# Patient Record
Sex: Female | Born: 1955
Health system: Southern US, Community
[De-identification: ages and names within clinical notes are randomized; demographics above are authoritative.]

## PROBLEM LIST (undated history)

## (undated) ENCOUNTER — Emergency Department: Admission: EM | Payer: Medicare HMO | Source: Home / Self Care

## (undated) DIAGNOSIS — K219 Gastro-esophageal reflux disease without esophagitis: Secondary | ICD-10-CM

## (undated) DIAGNOSIS — Z9889 Other specified postprocedural states: Secondary | ICD-10-CM

## (undated) DIAGNOSIS — M1990 Unspecified osteoarthritis, unspecified site: Secondary | ICD-10-CM

## (undated) DIAGNOSIS — E213 Hyperparathyroidism, unspecified: Secondary | ICD-10-CM

## (undated) DIAGNOSIS — Z87442 Personal history of urinary calculi: Secondary | ICD-10-CM

## (undated) DIAGNOSIS — J189 Pneumonia, unspecified organism: Secondary | ICD-10-CM

## (undated) DIAGNOSIS — R112 Nausea with vomiting, unspecified: Secondary | ICD-10-CM

## (undated) DIAGNOSIS — I10 Essential (primary) hypertension: Secondary | ICD-10-CM

## (undated) DIAGNOSIS — R7303 Prediabetes: Secondary | ICD-10-CM

## (undated) HISTORY — PX: FRACTURE SURGERY: SHX138

## (undated) HISTORY — PX: APPENDECTOMY: SHX54

## (undated) HISTORY — PX: COLONOSCOPY W/ POLYPECTOMY: SHX1380

## (undated) HISTORY — PX: BACK SURGERY: SHX140

---

## 2017-10-14 ENCOUNTER — Other Ambulatory Visit: Payer: Self-pay

## 2017-10-14 ENCOUNTER — Encounter: Payer: Self-pay | Admitting: Emergency Medicine

## 2017-10-14 ENCOUNTER — Emergency Department
Admission: EM | Admit: 2017-10-14 | Discharge: 2017-10-14 | Disposition: A | Discharge status: Home / Self Care | Payer: BLUE CROSS/BLUE SHIELD | Attending: Emergency Medicine | Admitting: Emergency Medicine

## 2017-10-14 DIAGNOSIS — F17200 Nicotine dependence, unspecified, uncomplicated: Secondary | ICD-10-CM | POA: Diagnosis not present

## 2017-10-14 DIAGNOSIS — M436 Torticollis: Secondary | ICD-10-CM | POA: Insufficient documentation

## 2017-10-14 DIAGNOSIS — M542 Cervicalgia: Secondary | ICD-10-CM | POA: Diagnosis present

## 2017-10-14 DIAGNOSIS — M25511 Pain in right shoulder: Secondary | ICD-10-CM | POA: Insufficient documentation

## 2017-10-14 DIAGNOSIS — I10 Essential (primary) hypertension: Secondary | ICD-10-CM | POA: Diagnosis not present

## 2017-10-14 HISTORY — DX: Essential (primary) hypertension: I10

## 2017-10-14 MED ORDER — OXYCODONE-ACETAMINOPHEN 5-325 MG PO TABS
1.0000 | ORAL_TABLET | ORAL | Status: DC | PRN
  Administered 2017-10-14: 1 via ORAL

## 2017-10-14 MED ORDER — DIAZEPAM 2 MG PO TABS
2.0000 mg | ORAL_TABLET | Freq: Once | ORAL | Status: AC
  Administered 2017-10-14: 2 mg via ORAL
  Filled 2017-10-14: qty 1

## 2017-10-14 MED ORDER — OXYCODONE-ACETAMINOPHEN 5-325 MG PO TABS: 1.0000 | ORAL_TABLET | ORAL | 0 refills | Status: DC | PRN

## 2017-10-14 MED ORDER — DIAZEPAM 2 MG PO TABS: 2.0000 mg | ORAL_TABLET | Freq: Three times a day (TID) | ORAL | 0 refills | Status: DC | PRN

## 2017-10-14 MED ORDER — OXYCODONE-ACETAMINOPHEN 5-325 MG PO TABS
ORAL_TABLET | ORAL | Status: AC
  Filled 2017-10-14: qty 1

## 2017-10-14 NOTE — Discharge Instructions (Signed)
1.  You may continue to take ibuprofen 3 times daily.  Add Percocet for pain and Valium for muscle relaxation. 2.

## 2017-10-14 NOTE — ED Triage Notes (Signed)
Pt reports she woke up from sleep with severe neck pain to left side that radiates to left shoulder reports she has been having a pinched nerve all week on and off. Pt talks in complete sentences no respiratory distress noted

## 2017-10-14 NOTE — ED Notes (Signed)
Pt reports took 800mg  Aleve around 11pm last night

## 2017-10-14 NOTE — ED Provider Notes (Signed)
[facility] Emergency Department Provider Note   ____________________________________________   First MD Initiated Contact with Patient 10/14/17 [phone]     (approximate)  I have reviewed the triage vital signs and the nursing notes.   HISTORY  Chief Complaint Neck Pain and Shoulder Pain    HPI Michele Sanders is a 62 y.o. female who presents to the ED from home with a chief complaint of nontraumatic neck pain.  Patient awoke at 11 PM with pain to the left side of her neck which radiates to her left shoulder.  States she has been having a pinched nerve all week on and off.  Last week it was affecting her right shoulder, tonight it is affecting her left, nondominant shoulder.  Denies extremity weakness, numbness or tingling.  Pain is exacerbated by turning her neck.  Denies recent fever, chills, chest pain, shortness of breath, abdominal pain, nausea or vomiting.  Denies recent trauma.   Past Medical History:  Diagnosis Date  . Hypertension     There are no active problems to display for this patient.   Past Surgical History:  Procedure Laterality Date  . APPENDECTOMY    . BACK SURGERY      Prior to Admission medications   Medication Sig Start Date End Date Taking? Authorizing Provider  diazepam (VALIUM) 2 MG tablet Take 1 tablet (2 mg total) by mouth every 8 (eight) hours as needed for muscle spasms. 10/14/17   Paulette Blanch, MD  oxyCODONE-acetaminophen (PERCOCET/ROXICET) 5-325 MG tablet Take 1 tablet by mouth every 4 (four) hours as needed for severe pain. 10/14/17   Paulette Blanch, MD    Allergies Patient has no allergy information on record.  No family history on file.  Social History Social History   Tobacco Use  . Smoking status: Current Every Day Smoker    Packs/day: 0.50  Substance Use Topics  . Alcohol use: Not Currently  . Drug use: Not on file    Review of Systems  Constitutional: No fever/chills Eyes: No visual changes. ENT: No  sore throat. Cardiovascular: Denies chest pain. Respiratory: Denies shortness of breath. Gastrointestinal: No abdominal pain.  No nausea, no vomiting.  No diarrhea.  No constipation. Genitourinary: Negative for dysuria. Musculoskeletal: Positive for neck pain.  Negative for back pain. Skin: Negative for rash. Neurological: Negative for headaches, focal weakness or numbness.   ____________________________________________   PHYSICAL EXAM:  VITAL SIGNS: ED Triage Vitals  Enc Vitals Group     BP 10/14/17 0155 (!) 164/88     Pulse Rate 10/14/17 0155 94     Resp 10/14/17 0155 20     Temp 10/14/17 0155 98.5 F (36.9 C)     Temp Source 10/14/17 0155 Oral     SpO2 10/14/17 0155 98 %     Weight 10/14/17 0156 165 lb (74.8 kg)     Height 10/14/17 0156 5\' 5"  (1.651 m)     Head Circumference --      Peak Flow --      Pain Score 10/14/17 0155 10     Pain Loc --      Pain Edu? --      Excl. in [place]? --     Constitutional: Alert and oriented. Well appearing and in mild acute distress. Eyes: Conjunctivae are normal. PERRL. EOMI. Head: Atraumatic. Nose: No congestion/rhinnorhea. Mouth/Throat: Mucous membranes are moist.  Oropharynx non-erythematous. Neck: No stridor.  No cervical spine tenderness to palpation.  Left trapezius muscle spasms.  Pain on turning head to the left.  No meningismus.  No carotid bruits. Cardiovascular: Normal rate, regular rhythm. Grossly normal heart sounds.  Good peripheral circulation. Respiratory: Normal respiratory effort.  No retractions. Lungs CTAB. Gastrointestinal: Soft and nontender. No distention. No abdominal bruits. No CVA tenderness. Musculoskeletal:  Limited range of movement to left shoulder secondary to neck pain. No lower extremity tenderness nor edema.  No joint effusions. Neurologic:  Normal speech and language. No gross focal neurologic deficits are appreciated. MAEx4.  5/5 motor strength and sensation all extremities.  No gait  instability. Skin:  Skin is warm, dry and intact. No rash noted. Psychiatric: Mood and affect are normal. Speech and behavior are normal.  ____________________________________________   LABS (all labs ordered are listed, but only abnormal results are displayed)  Labs Reviewed - No data to display ____________________________________________  EKG  None ____________________________________________  RADIOLOGY  ED MD interpretation: None  Official radiology report(s): No results found.  ____________________________________________   PROCEDURES  Procedure(s) performed: None  Procedures  Critical Care performed: No  ____________________________________________   INITIAL IMPRESSION / ASSESSMENT AND PLAN / ED COURSE  As part of my medical decision making, I reviewed the following data within the electronic MEDICAL RECORD NUMBER History obtained from family, Nursing notes reviewed and incorporated, Old chart reviewed and Notes from prior ED visits   62 year old female who presents with left neck pain.  Differential diagnosis includes but is not limited to cervical strain, torticollis, cervical radiculopathy, CAD, lung etiology, etc.  Patient clearly has musculoskeletal tenderness on examination with muscle spasms.  She took ibuprofen prior to arrival.  In addition, patient received Percocet in triage which has brought her pain from 12/10 to 8/10.  Will add Valium for muscle relaxation and refer to orthopedics for outpatient follow-up.  Strict return precautions given.  Patient and spouse verbalize understanding and agree with plan of care.      ____________________________________________   FINAL CLINICAL IMPRESSION(S) / ED DIAGNOSES  Final diagnoses:  Torticollis     ED Discharge Orders         Ordered    oxyCODONE-acetaminophen (PERCOCET/ROXICET) 5-325 MG tablet  Every 4 hours PRN     10/14/17 0315    diazepam (VALIUM) 2 MG tablet  Every 8 hours PRN     10/14/17  0315           Note:  This document was prepared using Dragon voice recognition software and may include unintentional dictation errors.    Paulette Blanch, MD 10/14/17 [phone]

## 2017-10-14 NOTE — ED Notes (Signed)
Provided pt with an ice bag

## 2017-10-17 ENCOUNTER — Other Ambulatory Visit: Payer: Self-pay | Admitting: Internal Medicine

## 2017-10-17 DIAGNOSIS — I10 Essential (primary) hypertension: Secondary | ICD-10-CM | POA: Insufficient documentation

## 2017-10-17 DIAGNOSIS — F17210 Nicotine dependence, cigarettes, uncomplicated: Secondary | ICD-10-CM | POA: Insufficient documentation

## 2017-10-17 DIAGNOSIS — Z1231 Encounter for screening mammogram for malignant neoplasm of breast: Secondary | ICD-10-CM

## 2017-10-17 DIAGNOSIS — M8589 Other specified disorders of bone density and structure, multiple sites: Secondary | ICD-10-CM | POA: Insufficient documentation

## 2017-11-14 ENCOUNTER — Encounter: Payer: Self-pay | Admitting: Podiatry

## 2017-11-14 ENCOUNTER — Ambulatory Visit: Payer: BLUE CROSS/BLUE SHIELD | Admitting: Podiatry

## 2017-11-14 VITALS — BP 141/75 | HR 89 | Resp 16

## 2017-11-14 DIAGNOSIS — L600 Ingrowing nail: Secondary | ICD-10-CM

## 2017-11-14 MED ORDER — NEOMYCIN-POLYMYXIN-HC 1 % OT SOLN: OTIC | 1 refills | Status: DC

## 2017-11-14 NOTE — Patient Instructions (Signed)

## 2017-11-14 NOTE — Progress Notes (Signed)
Subjective:  Patient ID: Michele Sanders, female    DOB: [birth year],  MRN: [id number] HPI Chief Complaint  Patient presents with  . Toe Pain    Hallux left - medial border, tender x years, intermittently, red and swollen today, soaking in epsom salt daily, had border removed before but only temp  . New Patient (Initial Visit)    62 y.o. female presents with the above complaint.   ROS: Denies fever chills nausea vomiting muscle aches pains calf pain back pain chest pain shortness of breath.  Past Medical History:  Diagnosis Date  . Hypertension    Past Surgical History:  Procedure Laterality Date  . APPENDECTOMY    . BACK SURGERY      Current Outpatient Medications:  .  aspirin 81 MG chewable tablet, Chew by mouth daily., Disp: , Rfl:  .  Calcium Carb-Cholecalciferol (CALCIUM + D3 PO), Take by mouth., Disp: , Rfl:  .  CHANTIX STARTING MONTH PAK 0.5 MG X 11 & 1 MG X 42 tablet, TAKE AS DIRECTED PER PACKAGE INSTRUCTIONS, Disp: , Rfl: 0 .  lisinopril (PRINIVIL,ZESTRIL) 10 MG tablet, Take 10 mg by mouth daily., Disp: , Rfl: 1 .  NEOMYCIN-POLYMYXIN-HYDROCORTISONE (CORTISPORIN) 1 % SOLN OTIC solution, Apply 1-2 drops to toe BID after soaking, Disp: 10 mL, Rfl: 1 .  simvastatin (ZOCOR) 40 MG tablet, Take by mouth., Disp: , Rfl:   No Known Allergies Review of Systems Objective:   Vitals:   11/14/17 1025  BP: (!) 141/75  Pulse: 89  Resp: 16    General: Well developed, nourished, in no acute distress, alert and oriented x3   Dermatological: Skin is warm, dry and supple bilateral. Nails x 10 are well maintained; remaining integument appears unremarkable at this time. There are no open sores, no preulcerative lesions, no rash or signs of infection present.  Onychocryptosis.  Hallux left does demonstrate a sharp incurvated nail margin secondary to onychocryptosis with erythema and edema and a small abscess distally.  There is currently no purulence no malodor.  Vascular: Dorsalis Pedis  artery and Posterior Tibial artery pedal pulses are 2/4 bilateral with immedate capillary fill time. Pedal hair growth present. No varicosities and no lower extremity edema present bilateral.   Neruologic: Grossly intact via light touch bilateral. Vibratory intact via tuning fork bilateral. Protective threshold with Semmes Wienstein monofilament intact to all pedal sites bilateral. Patellar and Achilles deep tendon reflexes 2+ bilateral. No Babinski or clonus noted bilateral.   Musculoskeletal: No gross boney pedal deformities bilateral. No pain, crepitus, or limitation noted with foot and ankle range of motion bilateral. Muscular strength 5/5 in all groups tested bilateral.  Gait: Unassisted, Nonantalgic.    Radiographs:  None taken  Assessment & Plan:   Assessment: Ingrown toenail tibial border hallux left  Plan: Discussed etiology pathology conservative versus surgical therapies.  Chemical matricectomy was performed after 3 cc of a 50-50 mixture of Marcaine plain lidocaine plain was infiltrated in a hallux block.  She tolerated the procedure well.  She is provided with both oral and written home-going instructions for the care and soaking of her toe.  She is also provided with a prescription for Cortisporin Otic to be applied twice daily after soaking.  I will follow-up with her in 1 month.     Husayn Reim T. [place], [place]

## 2017-11-29 ENCOUNTER — Other Ambulatory Visit: Payer: BLUE CROSS/BLUE SHIELD

## 2018-01-19 ENCOUNTER — Other Ambulatory Visit: Payer: Self-pay | Admitting: Physician Assistant

## 2018-01-19 DIAGNOSIS — Z1231 Encounter for screening mammogram for malignant neoplasm of breast: Secondary | ICD-10-CM

## 2018-02-13 ENCOUNTER — Emergency Department
Admission: EM | Admit: 2018-02-13 | Discharge: 2018-02-14 | Disposition: A | Discharge status: Home / Self Care | Payer: BLUE CROSS/BLUE SHIELD | Attending: Emergency Medicine | Admitting: Emergency Medicine

## 2018-02-13 ENCOUNTER — Other Ambulatory Visit: Payer: Self-pay

## 2018-02-13 ENCOUNTER — Emergency Department: Payer: BLUE CROSS/BLUE SHIELD

## 2018-02-13 ENCOUNTER — Encounter: Payer: Self-pay | Admitting: Emergency Medicine

## 2018-02-13 DIAGNOSIS — R0789 Other chest pain: Secondary | ICD-10-CM | POA: Diagnosis present

## 2018-02-13 DIAGNOSIS — Z7982 Long term (current) use of aspirin: Secondary | ICD-10-CM | POA: Diagnosis not present

## 2018-02-13 DIAGNOSIS — F17210 Nicotine dependence, cigarettes, uncomplicated: Secondary | ICD-10-CM | POA: Diagnosis not present

## 2018-02-13 DIAGNOSIS — I10 Essential (primary) hypertension: Secondary | ICD-10-CM | POA: Insufficient documentation

## 2018-02-13 DIAGNOSIS — K449 Diaphragmatic hernia without obstruction or gangrene: Secondary | ICD-10-CM | POA: Insufficient documentation

## 2018-02-13 DIAGNOSIS — R911 Solitary pulmonary nodule: Secondary | ICD-10-CM | POA: Diagnosis not present

## 2018-02-13 DIAGNOSIS — Z79899 Other long term (current) drug therapy: Secondary | ICD-10-CM | POA: Diagnosis not present

## 2018-02-13 LAB — CBC WITH DIFFERENTIAL/PLATELET
Abs Immature Granulocytes: 0.06 10*3/uL (ref 0.00–0.07)
BASOS PCT: 1 %
Basophils Absolute: 0.1 10*3/uL (ref 0.0–0.1)
EOS ABS: 0.2 10*3/uL (ref 0.0–0.5)
EOS PCT: 1 %
HEMATOCRIT: 45.2 % (ref 36.0–46.0)
Hemoglobin: 15.2 g/dL — ABNORMAL HIGH (ref 12.0–15.0)
IMMATURE GRANULOCYTES: 0 %
LYMPHS ABS: 4.3 10*3/uL — AB (ref 0.7–4.0)
Lymphocytes Relative: 30 %
MCH: 31.2 pg (ref 26.0–34.0)
MCHC: 33.6 g/dL (ref 30.0–36.0)
MCV: 92.8 fL (ref 80.0–100.0)
Monocytes Absolute: 0.9 10*3/uL (ref 0.1–1.0)
Monocytes Relative: 6 %
NEUTROS PCT: 62 %
NRBC: 0 % (ref 0.0–0.2)
Neutro Abs: 8.9 10*3/uL — ABNORMAL HIGH (ref 1.7–7.7)
Platelets: 310 10*3/uL (ref 150–400)
RBC: 4.87 MIL/uL (ref 3.87–5.11)
RDW: 12.2 % (ref 11.5–15.5)
WBC: 14.4 10*3/uL — AB (ref 4.0–10.5)

## 2018-02-13 LAB — COMPREHENSIVE METABOLIC PANEL
ALT: 45 U/L — AB (ref 0–44)
AST: 25 U/L (ref 15–41)
Albumin: 4.2 g/dL (ref 3.5–5.0)
Alkaline Phosphatase: 71 U/L (ref 38–126)
Anion gap: 11 (ref 5–15)
BILIRUBIN TOTAL: 0.6 mg/dL (ref 0.3–1.2)
BUN: 15 mg/dL (ref 8–23)
CALCIUM: 9.7 mg/dL (ref 8.9–10.3)
CO2: 23 mmol/L (ref 22–32)
Chloride: 103 mmol/L (ref 98–111)
Creatinine, Ser: 0.58 mg/dL (ref 0.44–1.00)
GFR calc Af Amer: >60 mL/min (ref >60)
Glucose, Bld: 108 mg/dL — ABNORMAL HIGH (ref 70–99)
POTASSIUM: 3.3 mmol/L — AB (ref 3.5–5.1)
Sodium: 137 mmol/L (ref 135–145)
TOTAL PROTEIN: 7.4 g/dL (ref 6.5–8.1)

## 2018-02-13 LAB — FIBRIN DERIVATIVES D-DIMER (ARMC ONLY): Fibrin derivatives D-dimer (ARMC): 451.27 ng/mL (FEU) (ref 0.00–499.00)

## 2018-02-13 LAB — TROPONIN I

## 2018-02-13 MED ORDER — ALUM & MAG HYDROXIDE-SIMETH 200-200-20 MG/5ML PO SUSP
30.0000 mL | Freq: Once | ORAL | Status: AC
  Administered 2018-02-13: 30 mL via ORAL
  Filled 2018-02-13: qty 30

## 2018-02-13 MED ORDER — LIDOCAINE VISCOUS HCL 2 % MT SOLN
15.0000 mL | Freq: Once | OROMUCOSAL | Status: AC
  Administered 2018-02-13: 15 mL via ORAL
  Filled 2018-02-13: qty 15

## 2018-02-13 NOTE — ED Triage Notes (Signed)
Pt to triage via w/c with no distress noted; st since noon having heaviness to upper chest with no accomp symptoms; st hx of same with PE many years ago

## 2018-02-13 NOTE — ED Notes (Signed)
Patient hooked to monitors. Will continue to monitor.

## 2018-02-13 NOTE — ED Notes (Signed)
EDP in with patient 

## 2018-02-13 NOTE — ED Notes (Signed)
GI cocktail given per orders.

## 2018-02-13 NOTE — ED Notes (Signed)
Pain started around 12pm in center of chest. Pain when palpated. Patient states as history of blood clot in late 90's.

## 2018-02-14 ENCOUNTER — Emergency Department: Payer: BLUE CROSS/BLUE SHIELD

## 2018-02-14 ENCOUNTER — Ambulatory Visit
Admission: RE | Admit: 2018-02-14 | Discharge: 2018-02-14 | Disposition: A | Discharge status: Home / Self Care | Payer: BLUE CROSS/BLUE SHIELD | Source: Ambulatory Visit | Attending: Physician Assistant | Admitting: Physician Assistant

## 2018-02-14 DIAGNOSIS — Z1231 Encounter for screening mammogram for malignant neoplasm of breast: Secondary | ICD-10-CM

## 2018-02-14 LAB — TROPONIN I

## 2018-02-14 MED ORDER — PANTOPRAZOLE SODIUM 40 MG PO TBEC: 40.0000 mg | DELAYED_RELEASE_TABLET | Freq: Every day | ORAL | 1 refills | Status: DC

## 2018-02-14 MED ORDER — IOHEXOL 350 MG/ML SOLN
75.0000 mL | Freq: Once | INTRAVENOUS | Status: AC | PRN
  Administered 2018-02-14: 75 mL via INTRAVENOUS

## 2018-02-14 MED ORDER — PANTOPRAZOLE SODIUM 40 MG PO TBEC
40.0000 mg | DELAYED_RELEASE_TABLET | Freq: Once | ORAL | Status: AC
  Administered 2018-02-14: 40 mg via ORAL
  Filled 2018-02-14: qty 1

## 2018-02-14 NOTE — ED Notes (Signed)
Patient returned to room from CT. 

## 2018-02-14 NOTE — ED Provider Notes (Signed)
[facility] Emergency Department Provider Note ________________________   First MD Initiated Contact with Patient 02/14/18 0001     (approximate)  I have reviewed the triage vital signs and the nursing notes.   HISTORY  Chief Complaint Chest Pain    HPI Gerturde Sanders is a 62 y.o. female with history of hypertension and previous pulmonary emboli many years ago presents to the emergency department with central chest discomfort which patient stated started this morning.  Patient states that she takes but potassium and calcium pills daily and feels as though a pill was stuck sideways in the area of discomfort.  Patient denies any dyspnea no lower extremity pain or swelling.  Patient states that symptoms are also consistent with when she had a pulmonary emboli.  Patient denies any cardiac disease.  Patient denies any familial history of CAD.  Past Medical History:  Diagnosis Date  . Hypertension     Patient Active Problem List   Diagnosis Date Noted  . Cigarette smoker one half pack a day or less 10/17/2017  . Essential hypertension 10/17/2017  . Osteopenia of multiple sites 10/17/2017    Past Surgical History:  Procedure Laterality Date  . APPENDECTOMY    . BACK SURGERY      Prior to Admission medications   Medication Sig Start Date End Date Taking? Authorizing Provider  aspirin 81 MG chewable tablet Chew by mouth daily.    [provider]  Calcium Carb-Cholecalciferol (CALCIUM + D3 PO) Take by mouth.    [provider]  CHANTIX STARTING MONTH PAK 0.5 MG X 11 & 1 MG X 42 tablet TAKE AS DIRECTED PER PACKAGE INSTRUCTIONS 10/17/17   [provider]  lisinopril (PRINIVIL,ZESTRIL) 10 MG tablet Take 10 mg by mouth daily. 10/17/17   [provider]  NEOMYCIN-POLYMYXIN-HYDROCORTISONE (CORTISPORIN) 1 % SOLN OTIC solution Apply 1-2 drops to toe BID after soaking 11/14/17   Hyatt, Max T, DPM  pantoprazole (PROTONIX) 40 MG tablet  Take 1 tablet (40 mg total) by mouth daily. 02/14/18 02/14/19  Gregor Hams, MD  simvastatin (ZOCOR) 40 MG tablet Take by mouth.    [provider]    Allergies No known drug allergies No family history on file.  Social History Social History   Tobacco Use  . Smoking status: Current Every Day Smoker    Packs/day: 0.50  . Smokeless tobacco: Never Used  Substance Use Topics  . Alcohol use: Yes  . Drug use: Not on file    Review of Systems Constitutional: No fever/chills Eyes: No visual changes. ENT: No sore throat. Cardiovascular: Positive for chest pain. Respiratory: Denies shortness of breath. Gastrointestinal: No abdominal pain.  No nausea, no vomiting.  No diarrhea.  No constipation. Genitourinary: Negative for dysuria. Musculoskeletal: Negative for neck pain.  Negative for back pain. Integumentary: Negative for rash. Neurological: Negative for headaches, focal weakness or numbness.   ____________________________________________   PHYSICAL EXAM:  VITAL SIGNS: ED Triage Vitals  Enc Vitals Group     BP 02/13/18 1954 (!) 147/77     Pulse Rate 02/13/18 1954 87     Resp 02/13/18 1954 18     Temp 02/13/18 1954 97.8 F (36.6 C)     Temp Source 02/13/18 1954 Oral     SpO2 02/13/18 1954 97 %     Weight 02/13/18 1953 74.4 kg (164 lb)     Height 02/13/18 1953 1.651 m (5\' 5" )     Head Circumference --  Peak Flow --      Pain Score 02/13/18 1953 10     Pain Loc --      Pain Edu? --      Excl. in [place]? --     Constitutional: Alert and oriented. Well appearing and in no acute distress. Eyes: Conjunctivae are normal.  Mouth/Throat: Mucous membranes are moist. Oropharynx non-erythematous. Neck: No stridor.   Cardiovascular: Normal rate, regular rhythm. Good peripheral circulation. Grossly normal heart sounds. Respiratory: Normal respiratory effort.  No retractions. Lungs CTAB. Gastrointestinal: Soft and nontender. No distention.  Musculoskeletal: No  lower extremity tenderness nor edema. No gross deformities of extremities. Neurologic:  Normal speech and language. No gross focal neurologic deficits are appreciated.  Skin:  Skin is warm, dry and intact. No rash noted. Psychiatric: Mood and affect are normal. Speech and behavior are normal.  ____________________________________________   LABS (all labs ordered are listed, but only abnormal results are displayed)  Labs Reviewed  CBC WITH DIFFERENTIAL/PLATELET - Abnormal; Notable for the following components:      Result Value   WBC 14.4 (*)    Hemoglobin 15.2 (*)    Neutro Abs 8.9 (*)    Lymphs Abs 4.3 (*)    All other components within normal limits  COMPREHENSIVE METABOLIC PANEL - Abnormal; Notable for the following components:   Potassium 3.3 (*)    Glucose, Bld 108 (*)    ALT 45 (*)    All other components within normal limits  TROPONIN I  FIBRIN DERIVATIVES D-DIMER ([facility] ONLY)  TROPONIN I   ____________________________________________  EKG  ED ECG REPORT I, Geneva N Elnoria Livingston, the attending physician, personally viewed and interpreted this ECG.   Date: 02/13/2018  EKG Time: 8:01 PM  Rate: 88  Rhythm: Normal sinus rhythm  Axis: Normal  Intervals: Normal  ST&T Change: None  ____________________________________________  RADIOLOGY I, Askov N Milarose Savich, personally viewed and evaluated these images (plain radiographs) as part of my medical decision making, as well as reviewing the written report by the radiologist.  ED MD interpretation: No active cardiopulmonary disease on chest x-ray. CT chest revealed no pulmonary emboli.  Positive pulmonary nodules bilaterally.  And a hiatal hernia.  Official radiology report(s): Dg Chest 2 View  Result Date: 02/13/2018 CLINICAL DATA:  Chest pain EXAM: CHEST - 2 VIEW COMPARISON:  None. FINDINGS: The heart size and mediastinal contours are within normal limits. Both lungs are clear. Left humeral fracture fixation. IMPRESSION: No  active cardiopulmonary disease. Electronically Signed   By: Franchot Gallo M.D.   On: 02/13/2018 20:15   Ct Angio Chest Pe W And/or Wo Contrast  Result Date: 02/14/2018 CLINICAL DATA:  Upper chest heaviness. History of pulmonary emboli. Smoker. EXAM: CT ANGIOGRAPHY CHEST WITH CONTRAST TECHNIQUE: Multidetector CT imaging of the chest was performed using the standard protocol during bolus administration of intravenous contrast. Multiplanar CT image reconstructions and MIPs were obtained to evaluate the vascular anatomy. CONTRAST:  75mL OMNIPAQUE IOHEXOL 350 MG/ML SOLN COMPARISON:  Chest radiographs obtained earlier this evening. FINDINGS: Cardiovascular: Normally opacified pulmonary arteries with no pulmonary arterial filling defects seen. Mild atheromatous changes involving the aorta. Normal sized heart. Mediastinum/Nodes: Small to moderate-sized hiatal hernia. No enlarged lymph nodes. Unremarkable thyroid gland. Lungs/Pleura: Mild bilateral bullous changes and prominence of the interstitial markings with mild peribronchial thickening. Multiple small sub solid appearing nodules in the left upper lobe, the largest measuring 5 mm in maximum diameter on image number 28 series 6. There's a similar-sized slightly more solid-appearing  nodule in the superior segment of the right lower lobe, measuring 5 mm on image number 32 series 6. No pleural fluid. Upper Abdomen: Unremarkable. Musculoskeletal: Mild thoracic spine degenerative changes. Review of the MIP images confirms the above findings. IMPRESSION: 1. No pulmonary emboli. 2. Mild changes of COPD and chronic bronchitis. 3. Multiple small bilateral lung nodules, as described above. Non-contrast chest CT at 3-6 months is recommended. If nodules persist and are stable at that time, consider additional non-contrast chest CT examinations at 2 and 4 years. This recommendation follows the consensus statement: Guidelines for Management of Incidental Pulmonary Nodules  Detected on CT Images: From the Fleischner Society 2017; Radiology 2017; 284:228-243. 4. Small to moderate-sized hiatal hernia. 5. Mild calcified and noncalcified aortic atherosclerosis. Aortic Atherosclerosis (ICD10-I70.0) and Emphysema (ICD10-J43.9). Electronically Signed   By: Claudie Revering M.D.   On: 02/14/2018 01:23      Procedures   ____________________________________________   INITIAL IMPRESSION / ASSESSMENT AND PLAN / ED COURSE  As part of my medical decision making, I reviewed the following data within the electronic MEDICAL RECORD NUMBER  62 year old female presented with above-stated history and physical exam secondary to central chest discomfort that is nonradiating in nature.  Considered possibly of CAD and a such EKG was performed which revealed no evidence of ischemia or infarction.  Troponin negative x2.  Also consider the possibility of a pulmonary emboli however CT scan revealed no by.  Patient was given a GI cocktail as I suspect this to be secondary to esophagitis the patient states that she has had considerable reflux in the past month.  Patient's pain is all briefly after GI cocktail.  Patient given Protonix 40 mg tablet with recommendation to follow-up with primary care provider.    ____________________________________________  FINAL CLINICAL IMPRESSION(S) / ED DIAGNOSES  Final diagnoses:  Hiatal hernia  Pulmonary nodule     MEDICATIONS GIVEN DURING THIS VISIT:  Medications  alum & mag hydroxide-simeth (MAALOX/MYLANTA) 200-200-20 MG/5ML suspension 30 mL (30 mLs Oral Given 02/13/18 2357)    And  lidocaine (XYLOCAINE) 2 % viscous mouth solution 15 mL (15 mLs Oral Given 02/13/18 2357)  iohexol (OMNIPAQUE) 350 MG/ML injection 75 mL (75 mLs Intravenous Contrast Given 02/14/18 0048)  pantoprazole (PROTONIX) EC tablet 40 mg (40 mg Oral Given 02/14/18 0210)     ED Discharge Orders         Ordered    pantoprazole (PROTONIX) 40 MG tablet  Daily     02/14/18 0232             Note:  This document was prepared using Dragon voice recognition software and may include unintentional dictation errors.    Gregor Hams, MD 02/14/18 [phone]

## 2018-02-14 NOTE — ED Notes (Signed)
IV placed in left forearm. Patient tolerated with no issues.

## 2018-02-14 NOTE — ED Notes (Signed)
Patient transferred to CT

## 2018-07-24 ENCOUNTER — Other Ambulatory Visit: Payer: Self-pay | Admitting: Physician Assistant

## 2018-07-24 DIAGNOSIS — K449 Diaphragmatic hernia without obstruction or gangrene: Secondary | ICD-10-CM

## 2018-07-24 DIAGNOSIS — E782 Mixed hyperlipidemia: Secondary | ICD-10-CM | POA: Diagnosis not present

## 2018-07-24 DIAGNOSIS — R1310 Dysphagia, unspecified: Secondary | ICD-10-CM | POA: Diagnosis not present

## 2018-07-24 DIAGNOSIS — R918 Other nonspecific abnormal finding of lung field: Secondary | ICD-10-CM

## 2018-07-24 DIAGNOSIS — K210 Gastro-esophageal reflux disease with esophagitis: Secondary | ICD-10-CM | POA: Diagnosis not present

## 2018-07-24 DIAGNOSIS — I10 Essential (primary) hypertension: Secondary | ICD-10-CM | POA: Diagnosis not present

## 2018-08-03 ENCOUNTER — Ambulatory Visit: Payer: Medicare HMO

## 2018-10-16 DIAGNOSIS — E782 Mixed hyperlipidemia: Secondary | ICD-10-CM | POA: Diagnosis not present

## 2018-10-16 DIAGNOSIS — I10 Essential (primary) hypertension: Secondary | ICD-10-CM | POA: Diagnosis not present

## 2018-10-16 DIAGNOSIS — R7303 Prediabetes: Secondary | ICD-10-CM | POA: Diagnosis not present

## 2018-10-23 DIAGNOSIS — E782 Mixed hyperlipidemia: Secondary | ICD-10-CM | POA: Diagnosis not present

## 2018-10-23 DIAGNOSIS — R7303 Prediabetes: Secondary | ICD-10-CM | POA: Diagnosis not present

## 2018-10-23 DIAGNOSIS — F17210 Nicotine dependence, cigarettes, uncomplicated: Secondary | ICD-10-CM | POA: Diagnosis not present

## 2018-10-23 DIAGNOSIS — I10 Essential (primary) hypertension: Secondary | ICD-10-CM | POA: Diagnosis not present

## 2018-10-23 DIAGNOSIS — Z0000 Encounter for general adult medical examination without abnormal findings: Secondary | ICD-10-CM | POA: Diagnosis not present

## 2018-12-01 ENCOUNTER — Other Ambulatory Visit: Payer: Self-pay

## 2018-12-01 DIAGNOSIS — Z20822 Contact with and (suspected) exposure to covid-19: Secondary | ICD-10-CM

## 2018-12-02 LAB — NOVEL CORONAVIRUS, NAA: SARS-CoV-2, NAA: NOT DETECTED

## 2018-12-04 ENCOUNTER — Telehealth: Payer: Self-pay | Admitting: Internal Medicine

## 2018-12-04 NOTE — Telephone Encounter (Signed)
Negative COVID results given. Patient results "NOT Detected." Caller expressed understanding. ° °

## 2019-03-18 DIAGNOSIS — I10 Essential (primary) hypertension: Secondary | ICD-10-CM | POA: Diagnosis not present

## 2019-03-18 DIAGNOSIS — R7303 Prediabetes: Secondary | ICD-10-CM | POA: Diagnosis not present

## 2019-03-18 DIAGNOSIS — E782 Mixed hyperlipidemia: Secondary | ICD-10-CM | POA: Diagnosis not present

## 2019-03-22 ENCOUNTER — Other Ambulatory Visit: Payer: Self-pay | Admitting: Physician Assistant

## 2019-03-22 DIAGNOSIS — Z1231 Encounter for screening mammogram for malignant neoplasm of breast: Secondary | ICD-10-CM

## 2019-03-25 DIAGNOSIS — E782 Mixed hyperlipidemia: Secondary | ICD-10-CM | POA: Diagnosis not present

## 2019-03-25 DIAGNOSIS — Z0000 Encounter for general adult medical examination without abnormal findings: Secondary | ICD-10-CM | POA: Diagnosis not present

## 2019-03-25 DIAGNOSIS — D72829 Elevated white blood cell count, unspecified: Secondary | ICD-10-CM | POA: Diagnosis not present

## 2019-03-25 DIAGNOSIS — K219 Gastro-esophageal reflux disease without esophagitis: Secondary | ICD-10-CM | POA: Diagnosis not present

## 2019-03-25 DIAGNOSIS — F17210 Nicotine dependence, cigarettes, uncomplicated: Secondary | ICD-10-CM | POA: Diagnosis not present

## 2019-03-25 DIAGNOSIS — F439 Reaction to severe stress, unspecified: Secondary | ICD-10-CM | POA: Diagnosis not present

## 2019-03-25 DIAGNOSIS — I10 Essential (primary) hypertension: Secondary | ICD-10-CM | POA: Diagnosis not present

## 2019-03-26 ENCOUNTER — Ambulatory Visit
Admission: RE | Admit: 2019-03-26 | Discharge: 2019-03-26 | Disposition: A | Discharge status: Home / Self Care | Payer: Medicare HMO | Source: Ambulatory Visit | Attending: Physician Assistant | Admitting: Physician Assistant

## 2019-03-26 DIAGNOSIS — Z1231 Encounter for screening mammogram for malignant neoplasm of breast: Secondary | ICD-10-CM | POA: Diagnosis not present

## 2019-03-28 ENCOUNTER — Ambulatory Visit: Payer: Medicare HMO | Attending: Internal Medicine

## 2019-03-28 DIAGNOSIS — Z23 Encounter for immunization: Secondary | ICD-10-CM

## 2019-03-28 NOTE — Progress Notes (Signed)
Covid-19 Vaccination Clinic  Name:  Victory Alves    MRN: [id number] DOB: 27-Dec-1955  03/28/2019  Ms. Stribling was observed post Covid-19 immunization for 15 minutes without incidence. She was provided with Vaccine Information Sheet and instruction to access the V-Safe system.   Ms. Batdorf was instructed to call 911 with any severe reactions post vaccine: Marland Kitchen Difficulty breathing  . Swelling of your face and throat  . A fast heartbeat  . A bad rash all over your body  . Dizziness and weakness    Immunizations Administered    Name Date Dose VIS Date Route   Pfizer COVID-19 Vaccine 03/28/2019  3:01 PM 0.3 mL 02/15/2019 Intramuscular   Manufacturer: [employer], [employer]   Lot: [id number]   NDC: [address]-4

## 2019-04-01 ENCOUNTER — Other Ambulatory Visit: Payer: Self-pay | Admitting: Physician Assistant

## 2019-04-01 DIAGNOSIS — R928 Other abnormal and inconclusive findings on diagnostic imaging of breast: Secondary | ICD-10-CM

## 2019-04-01 DIAGNOSIS — N6320 Unspecified lump in the left breast, unspecified quadrant: Secondary | ICD-10-CM

## 2019-04-03 ENCOUNTER — Ambulatory Visit
Admission: RE | Admit: 2019-04-03 | Discharge: 2019-04-03 | Disposition: A | Discharge status: Home / Self Care | Payer: Medicare HMO | Source: Ambulatory Visit | Attending: Physician Assistant | Admitting: Physician Assistant

## 2019-04-03 DIAGNOSIS — N6320 Unspecified lump in the left breast, unspecified quadrant: Secondary | ICD-10-CM | POA: Insufficient documentation

## 2019-04-03 DIAGNOSIS — N6323 Unspecified lump in the left breast, lower outer quadrant: Secondary | ICD-10-CM | POA: Diagnosis not present

## 2019-04-03 DIAGNOSIS — R928 Other abnormal and inconclusive findings on diagnostic imaging of breast: Secondary | ICD-10-CM

## 2019-04-03 DIAGNOSIS — R922 Inconclusive mammogram: Secondary | ICD-10-CM | POA: Diagnosis not present

## 2019-04-05 ENCOUNTER — Other Ambulatory Visit: Payer: Self-pay | Admitting: Physician Assistant

## 2019-04-05 DIAGNOSIS — N6002 Solitary cyst of left breast: Secondary | ICD-10-CM

## 2019-04-05 DIAGNOSIS — R928 Other abnormal and inconclusive findings on diagnostic imaging of breast: Secondary | ICD-10-CM

## 2019-04-08 ENCOUNTER — Ambulatory Visit
Admission: RE | Admit: 2019-04-08 | Discharge: 2019-04-08 | Disposition: A | Discharge status: Home / Self Care | Payer: Medicare HMO | Source: Ambulatory Visit | Attending: Physician Assistant | Admitting: Physician Assistant

## 2019-04-08 DIAGNOSIS — N6002 Solitary cyst of left breast: Secondary | ICD-10-CM

## 2019-04-08 DIAGNOSIS — I899 Noninfective disorder of lymphatic vessels and lymph nodes, unspecified: Secondary | ICD-10-CM | POA: Diagnosis not present

## 2019-04-08 DIAGNOSIS — R590 Localized enlarged lymph nodes: Secondary | ICD-10-CM | POA: Diagnosis not present

## 2019-04-08 DIAGNOSIS — R928 Other abnormal and inconclusive findings on diagnostic imaging of breast: Secondary | ICD-10-CM

## 2019-04-08 DIAGNOSIS — R0789 Other chest pain: Secondary | ICD-10-CM | POA: Diagnosis not present

## 2019-04-08 DIAGNOSIS — N6321 Unspecified lump in the left breast, upper outer quadrant: Secondary | ICD-10-CM | POA: Insufficient documentation

## 2019-04-08 HISTORY — PX: BREAST CYST ASPIRATION: SHX578

## 2019-04-09 HISTORY — PX: BREAST BIOPSY: SHX20

## 2019-04-10 LAB — SURGICAL PATHOLOGY

## 2019-04-18 ENCOUNTER — Ambulatory Visit: Payer: Medicare HMO | Attending: Internal Medicine

## 2019-04-18 DIAGNOSIS — Z23 Encounter for immunization: Secondary | ICD-10-CM | POA: Insufficient documentation

## 2019-04-18 NOTE — Progress Notes (Signed)
Covid-19 Vaccination Clinic  Name:  Michele Sanders    MRN: [id number] DOB: 27-May-1955  04/18/2019  Michele Sanders was observed post Covid-19 immunization for 15 minutes without incidence. She was provided with Vaccine Information Sheet and instruction to access the V-Safe system.   Michele Sanders was instructed to call 911 with any severe reactions post vaccine: Marland Kitchen Difficulty breathing  . Swelling of your face and throat  . A fast heartbeat  . A bad rash all over your body  . Dizziness and weakness    Immunizations Administered    Name Date Dose VIS Date Route   Pfizer COVID-19 Vaccine 04/18/2019  2:50 PM 0.3 mL 02/15/2019 Intramuscular   Manufacturer: [employer], [employer]   Lot: [id number]   NDC: [id number]

## 2019-06-05 DIAGNOSIS — Z20822 Contact with and (suspected) exposure to covid-19: Secondary | ICD-10-CM | POA: Diagnosis not present

## 2019-06-25 DIAGNOSIS — M25562 Pain in left knee: Secondary | ICD-10-CM | POA: Diagnosis not present

## 2019-06-25 DIAGNOSIS — Z87891 Personal history of nicotine dependence: Secondary | ICD-10-CM | POA: Diagnosis not present

## 2019-06-25 DIAGNOSIS — M25462 Effusion, left knee: Secondary | ICD-10-CM | POA: Diagnosis not present

## 2019-06-25 DIAGNOSIS — M1712 Unilateral primary osteoarthritis, left knee: Secondary | ICD-10-CM | POA: Diagnosis not present

## 2019-08-07 DIAGNOSIS — H524 Presbyopia: Secondary | ICD-10-CM | POA: Diagnosis not present

## 2019-09-25 DIAGNOSIS — D72829 Elevated white blood cell count, unspecified: Secondary | ICD-10-CM | POA: Diagnosis not present

## 2019-09-25 DIAGNOSIS — R7303 Prediabetes: Secondary | ICD-10-CM | POA: Diagnosis not present

## 2019-09-25 DIAGNOSIS — R7989 Other specified abnormal findings of blood chemistry: Secondary | ICD-10-CM | POA: Diagnosis not present

## 2019-09-25 DIAGNOSIS — I10 Essential (primary) hypertension: Secondary | ICD-10-CM | POA: Diagnosis not present

## 2019-09-25 DIAGNOSIS — E782 Mixed hyperlipidemia: Secondary | ICD-10-CM | POA: Diagnosis not present

## 2019-10-02 DIAGNOSIS — F17210 Nicotine dependence, cigarettes, uncomplicated: Secondary | ICD-10-CM | POA: Diagnosis not present

## 2019-10-02 DIAGNOSIS — R7303 Prediabetes: Secondary | ICD-10-CM | POA: Diagnosis not present

## 2019-10-02 DIAGNOSIS — R7989 Other specified abnormal findings of blood chemistry: Secondary | ICD-10-CM | POA: Diagnosis not present

## 2019-10-02 DIAGNOSIS — I10 Essential (primary) hypertension: Secondary | ICD-10-CM | POA: Diagnosis not present

## 2019-10-02 DIAGNOSIS — E782 Mixed hyperlipidemia: Secondary | ICD-10-CM | POA: Diagnosis not present

## 2019-10-14 DIAGNOSIS — H0012 Chalazion right lower eyelid: Secondary | ICD-10-CM | POA: Diagnosis not present

## 2019-11-26 DIAGNOSIS — Z20822 Contact with and (suspected) exposure to covid-19: Secondary | ICD-10-CM | POA: Diagnosis not present

## 2019-12-23 DIAGNOSIS — Z0110 Encounter for examination of ears and hearing without abnormal findings: Secondary | ICD-10-CM | POA: Diagnosis not present

## 2020-03-13 ENCOUNTER — Other Ambulatory Visit: Payer: Self-pay | Admitting: Physician Assistant

## 2020-03-13 DIAGNOSIS — N6320 Unspecified lump in the left breast, unspecified quadrant: Secondary | ICD-10-CM

## 2020-03-19 DIAGNOSIS — E782 Mixed hyperlipidemia: Secondary | ICD-10-CM | POA: Diagnosis not present

## 2020-03-19 DIAGNOSIS — R7303 Prediabetes: Secondary | ICD-10-CM | POA: Diagnosis not present

## 2020-03-19 DIAGNOSIS — I10 Essential (primary) hypertension: Secondary | ICD-10-CM | POA: Diagnosis not present

## 2020-03-26 DIAGNOSIS — R7989 Other specified abnormal findings of blood chemistry: Secondary | ICD-10-CM | POA: Diagnosis not present

## 2020-03-26 DIAGNOSIS — R7303 Prediabetes: Secondary | ICD-10-CM | POA: Diagnosis not present

## 2020-03-26 DIAGNOSIS — I10 Essential (primary) hypertension: Secondary | ICD-10-CM | POA: Diagnosis not present

## 2020-03-26 DIAGNOSIS — E782 Mixed hyperlipidemia: Secondary | ICD-10-CM | POA: Diagnosis not present

## 2020-03-26 DIAGNOSIS — F17210 Nicotine dependence, cigarettes, uncomplicated: Secondary | ICD-10-CM | POA: Diagnosis not present

## 2020-03-26 DIAGNOSIS — Z0000 Encounter for general adult medical examination without abnormal findings: Secondary | ICD-10-CM | POA: Diagnosis not present

## 2020-04-06 ENCOUNTER — Ambulatory Visit
Admission: RE | Admit: 2020-04-06 | Discharge: 2020-04-06 | Disposition: A | Discharge status: Home / Self Care | Payer: Medicare HMO | Source: Ambulatory Visit | Attending: Physician Assistant | Admitting: Physician Assistant

## 2020-04-06 ENCOUNTER — Other Ambulatory Visit: Payer: Self-pay | Admitting: Physician Assistant

## 2020-04-06 ENCOUNTER — Other Ambulatory Visit: Payer: Self-pay

## 2020-04-06 ENCOUNTER — Other Ambulatory Visit (HOSPITAL_COMMUNITY): Payer: Self-pay | Admitting: Physician Assistant

## 2020-04-06 DIAGNOSIS — R109 Unspecified abdominal pain: Secondary | ICD-10-CM

## 2020-04-06 DIAGNOSIS — K5730 Diverticulosis of large intestine without perforation or abscess without bleeding: Secondary | ICD-10-CM | POA: Diagnosis not present

## 2020-04-06 DIAGNOSIS — D259 Leiomyoma of uterus, unspecified: Secondary | ICD-10-CM | POA: Diagnosis not present

## 2020-04-06 DIAGNOSIS — K449 Diaphragmatic hernia without obstruction or gangrene: Secondary | ICD-10-CM | POA: Diagnosis not present

## 2020-04-06 DIAGNOSIS — N281 Cyst of kidney, acquired: Secondary | ICD-10-CM | POA: Diagnosis not present

## 2020-04-07 ENCOUNTER — Ambulatory Visit: Payer: Medicare HMO

## 2020-04-09 ENCOUNTER — Other Ambulatory Visit: Payer: Medicare HMO

## 2020-04-10 DIAGNOSIS — R7989 Other specified abnormal findings of blood chemistry: Secondary | ICD-10-CM | POA: Diagnosis not present

## 2020-04-16 ENCOUNTER — Ambulatory Visit: Payer: Medicare HMO | Admitting: Urology

## 2020-04-16 ENCOUNTER — Encounter: Payer: Self-pay | Admitting: Urology

## 2020-04-16 ENCOUNTER — Other Ambulatory Visit: Payer: Self-pay

## 2020-04-16 VITALS — BP 159/92 | HR 89 | Ht 65.0 in | Wt 185.0 lb

## 2020-04-16 DIAGNOSIS — N200 Calculus of kidney: Secondary | ICD-10-CM | POA: Diagnosis not present

## 2020-04-16 NOTE — Progress Notes (Signed)
04/16/2020 10:32 AM   Michele Sanders [birth year] [id number]  Referring provider: Ignacia Bayley, PA-C [address] [facility] [address] [place],  [place] [address]  Chief Complaint  Patient presents with  . Abdominal Pain    HPI: Michele Sanders is a 65 y.o. female referred for nephrolithiasis   Seen for an acute visit at [facility] on 04/06/2020 with onset of left flank pain rated 10/10  No precipitating, aggravating or alleviating factors  No nausea, vomiting  No fever, chills  Prior history of stone disease and CT scan ordered  Was prescribed short prednisone taper and after 3 days pain markedly improved  CT scan showed left lower pole nephrolithiasis  States between 2005-2010 she had 2-3 stones which were removed ureteroscopically.  Had no pain but were identified by urine abnormalities  No bothersome LUTS  Denies gross hematuria   PMH: Past Medical History:  Diagnosis Date  . Hypertension     Surgical History: Past Surgical History:  Procedure Laterality Date  . APPENDECTOMY    . BACK SURGERY      Home Medications:  Allergies as of 04/16/2020   No Known Allergies     Medication List       Accurate as of April 16, 2020 10:32 AM. If you have any questions, ask your nurse or doctor.        aspirin 81 MG chewable tablet Chew by mouth daily.   CALCIUM + D3 PO Take by mouth.   Chantix Starting Month Pak 0.5 MG X 11 & 1 MG X 42 tablet Generic drug: varenicline TAKE AS DIRECTED PER PACKAGE INSTRUCTIONS   lisinopril 10 MG tablet Commonly known as: ZESTRIL Take 10 mg by mouth daily.   NEOMYCIN-POLYMYXIN-HYDROCORTISONE 1 % Soln OTIC solution Commonly known as: CORTISPORIN Apply 1-2 drops to toe BID after soaking   pantoprazole 40 MG tablet Commonly known as: Protonix Take 1 tablet (40 mg total) by mouth daily.   simvastatin 40 MG tablet Commonly known as: ZOCOR Take by mouth.       Allergies: No Known  Allergies  Family History: Family History  Problem Relation Age of Onset  . Breast cancer Neg Hx     Social History:  reports that she has been smoking. She has been smoking about 0.50 packs per day. She has never used smokeless tobacco. She reports current alcohol use. No history on file for drug use.   Physical Exam: BP (!) 159/92   Pulse 89   Ht 5\' 5"  (1.651 m)   Wt 185 lb (83.9 kg)   BMI 30.79 kg/m   Constitutional:  Alert and oriented, No acute distress. HEENT: [place] AT, moist mucus membranes.  Trachea midline, no masses. Cardiovascular: No clubbing, cyanosis, or edema. Respiratory: Normal respiratory effort, no increased work of breathing. GI: Abdomen is soft, nontender, nondistended, no abdominal masses GU: No CVA tenderness Lymph: No cervical or inguinal lymphadenopathy. Skin: No rashes, bruises or suspicious lesions. Neurologic: Grossly intact, no focal deficits, moving all 4 extremities. Psychiatric: Normal mood and affect.   Pertinent Imaging: Images were personally reviewed and interpreted.  There is an approximately 12 mm calculus in a lower pole infundibulum with associated hydrocalyx of the lower pole.  A second 9 mm calcification is within the dilated calyx in addition to some smaller branching calculi  CT RENAL STONE STUDY  Narrative CLINICAL DATA:  Left flank pain since Saturday.  EXAM: CT ABDOMEN AND PELVIS WITHOUT CONTRAST  TECHNIQUE: Multidetector CT imaging of the  abdomen and pelvis was performed following the standard protocol without IV contrast.  COMPARISON:  None.  FINDINGS: Lower chest:  No acute finding.  Small sliding hiatal hernia.  Hepatobiliary: Hepatic steatosis.No evidence of biliary obstruction or stone.  Pancreas: Unremarkable.  Spleen: Unremarkable.  Adrenals/Urinary Tract: Negative adrenals. No hydronephrosis or stone. 5 cm right renal cystic density with simple appearance. Clustered/branching left lower pole calculi with  dilated associated calices. The largest discrete portion of calculus measures 11 mm on coronal reformats. Vague high-density in the interpolar left kidney measuring 5 mm, suspect hemorrhagic cyst based on density for size. Unremarkable bladder.  Stomach/Bowel: No obstruction. Appendectomy. No visible bowel inflammation. Left colonic diverticulosis, moderate.  Vascular/Lymphatic: No acute vascular abnormality. Multifocal atheromatous calcification. No mass or adenopathy.  Reproductive:Coarsely calcified uterine fibroid measuring 2.6 cm.  Other: No ascites or pneumoperitoneum.  Musculoskeletal: No acute abnormalities.  IMPRESSION: 1. No acute finding. 2. Large renal calculi clustered at the left lower pole where there appears to be chronic calyceal obstruction/dilatation. 3. 6 mm left renal lesion favoring hemorrhagic cyst. Consider ultrasound follow-up in 6 months. 4. Hepatic steatosis. 5. Colonic diverticulosis. 6.  Aortic Atherosclerosis (ICD10-I70.0).   Electronically Signed By: Marnee Spring M.D. On: 04/06/2020 11:45   Assessment & Plan:    1.  Left nephrolithiasis  She has significant left lower pole stone burden with lower pole hydrocalyx most likely due to infundibular obstruction.  Her recent pain was in line with renal colic and did not appear to be musculoskeletal. We discussed various treatment options for urolithiasis including observation with or without medical expulsive therapy, shockwave lithotripsy (SWL), ureteroscopy and laser lithotripsy with stent placement, and percutaneous nephrolithotomy. We discussed that management is based on stone size, location, density, patient co-morbidities, and patient preference.  Stones <106mm in size have a >80% spontaneous passage rate. Data surrounding the use of tamsulosin for medical expulsive therapy is controversial, but meta analyses suggests it is most efficacious for distal stones between 5-7mm in size. Possible  side effects include dizziness/lightheadedness, and retrograde ejaculation. SWL has a lower stone free rate in a single procedure, but also a lower complication rate compared to ureteroscopy and avoids a stent and associated stent related symptoms. Possible complications include renal hematoma, steinstrasse, and need for additional treatment.  Based on stone size would not recommend SWL Ureteroscopy with laser lithotripsy and stent placement has a higher stone free rate than SWL in a single procedure, however increased complication rate including possible infection, ureteral injury, bleeding, and stent related morbidity. Common stent related symptoms include dysuria, urgency/frequency, and flank pain.  Based on stone size it is possible she could require a staged procedure PCNL is the favored treatment for stones >2cm. It involves a small incision in the flank, with complete fragmentation of stones and removal. It has the highest stone free rate, but also the highest complication rate. Possible complications include bleeding, infection/sepsis, injury to surrounding organs including the pleura, and collecting system injury.  She inquired about observation however based on stone burden and hyper calyx would not recommend She would like to think over these options and will touch base with her next week Instructed to call back for recurrent flank pain   Riki Altes, MD  [facility] [address], [address] [place], [place] [address] [phone]

## 2020-04-23 ENCOUNTER — Telehealth: Payer: Self-pay | Admitting: *Deleted

## 2020-04-23 NOTE — Telephone Encounter (Signed)
Patient called in today and was asking if you talked her ct scan over with other doctors. She don't want to make a decisions until she hears them options.

## 2020-04-23 NOTE — Telephone Encounter (Signed)
Notified patient as instructed,.  

## 2020-04-23 NOTE — Telephone Encounter (Signed)
Scan was reviewed and in agreement that ureteroscopy would be the best treatment option.  Due to obstruction of the lower part of the kidney treatment was recommended.  If she has any questions I will be happy to call her

## 2020-04-28 ENCOUNTER — Ambulatory Visit
Admission: RE | Admit: 2020-04-28 | Discharge: 2020-04-28 | Disposition: A | Discharge status: Home / Self Care | Payer: Medicare HMO | Source: Ambulatory Visit | Attending: Physician Assistant | Admitting: Physician Assistant

## 2020-04-28 ENCOUNTER — Other Ambulatory Visit: Payer: Self-pay

## 2020-04-28 DIAGNOSIS — N6320 Unspecified lump in the left breast, unspecified quadrant: Secondary | ICD-10-CM

## 2020-04-28 DIAGNOSIS — N6332 Unspecified lump in axillary tail of the left breast: Secondary | ICD-10-CM | POA: Diagnosis not present

## 2020-04-28 DIAGNOSIS — R928 Other abnormal and inconclusive findings on diagnostic imaging of breast: Secondary | ICD-10-CM | POA: Diagnosis not present

## 2020-04-28 DIAGNOSIS — R590 Localized enlarged lymph nodes: Secondary | ICD-10-CM | POA: Insufficient documentation

## 2020-04-28 DIAGNOSIS — N6489 Other specified disorders of breast: Secondary | ICD-10-CM | POA: Diagnosis not present

## 2020-05-05 NOTE — Telephone Encounter (Signed)
Contacted patient last week and today and left message on voicemail to call back

## 2020-05-22 NOTE — Telephone Encounter (Signed)
I have been unable to get in contact with the patient.  Please schedule a telephone visit.  Thanks

## 2020-05-22 NOTE — Progress Notes (Signed)
Virtual Visit via Video Note  I connected with Michele Sanders on 05/25/2020 at  8:15 AM EDT by a video enabled telemedicine application and verified that I am speaking with the correct person using two identifiers.  Location: Patient: Home Provider: Office  Participants: Patient and provider only.    I discussed the limitations of evaluation and management by telemedicine and the availability of in person appointments. The patient expressed understanding and agreed to proceed.  History of Present Illness: Michele Sanders is a 65 y.o. female with left nephrolithiasis who returns for a 1 month follow.   - Prior history of stone disease and CT scan ordered - Patient stated between 2005-2010 she had 2-3 stones which were removed ureteroscopically.  Had no pain but were identified by urine abnormalities - Recent CT noted left lower pole nephrolithiasis - Today the patient reports no recurrent flank pain - Denies fevers or chills - No gross hematuria    Observations/Objective: Patient is engaged and asking good questions.   Assessment and Plan:  1. Left nephrolithiasis  - Asymptomatic  - May have obstructive lower pole calculus  - Today we discussed treatment options such as a PCNL, and a ureteroscopy. Risk of bleeding, infection, damage surrounding structures, injury to the bladder/ urethral, bladder neck contracture, ureteral stricture, retrograde ejaculation, stress/ urge incontinence, exacerbation of irritative voiding symptoms were all discussed in detail.    - Patient would prefer ureteroscopy however due to insurance and cost, has elected observation  - RUS ordered, will call with results  Follow Up Instructions: RTC  In 06/2020   I discussed the assessment and treatment plan with the patient. The patient was provided an opportunity to ask questions and all were answered. The patient agreed with the plan and demonstrated an understanding of the instructions.   The patient was  advised to call back or seek an in-person evaluation if the symptoms worsen or if the condition fails to improve as anticipated.  I provided 11 minutes of non-face-to-face time during this encounter.   Fransico Him, am acting as a scribe for Dr. Nicki Reaper C. Shaden Lacher,  I have reviewed the above documentation for accuracy and completeness, and I agree with the above.   Abbie Sons, MD

## 2020-05-22 NOTE — Telephone Encounter (Signed)
Pt LVM on triage line stating she missed a call and is unsure what the next step is.   Called pt back scheduled virtual appt.

## 2020-05-22 NOTE — Telephone Encounter (Signed)
Left message on voicemail.

## 2020-05-25 ENCOUNTER — Encounter: Payer: Self-pay | Admitting: Urology

## 2020-05-25 ENCOUNTER — Telehealth (INDEPENDENT_AMBULATORY_CARE_PROVIDER_SITE_OTHER): Payer: Medicare HMO | Admitting: Urology

## 2020-05-25 DIAGNOSIS — N200 Calculus of kidney: Secondary | ICD-10-CM

## 2020-06-05 DIAGNOSIS — S42211A Unspecified displaced fracture of surgical neck of right humerus, initial encounter for closed fracture: Secondary | ICD-10-CM | POA: Diagnosis not present

## 2020-06-05 DIAGNOSIS — S42251A Displaced fracture of greater tuberosity of right humerus, initial encounter for closed fracture: Secondary | ICD-10-CM | POA: Diagnosis not present

## 2020-06-05 DIAGNOSIS — S42291A Other displaced fracture of upper end of right humerus, initial encounter for closed fracture: Secondary | ICD-10-CM | POA: Diagnosis not present

## 2020-06-05 DIAGNOSIS — S42241A 4-part fracture of surgical neck of right humerus, initial encounter for closed fracture: Secondary | ICD-10-CM | POA: Diagnosis not present

## 2020-06-09 DIAGNOSIS — S42251A Displaced fracture of greater tuberosity of right humerus, initial encounter for closed fracture: Secondary | ICD-10-CM | POA: Diagnosis not present

## 2020-06-11 DIAGNOSIS — E213 Hyperparathyroidism, unspecified: Secondary | ICD-10-CM | POA: Diagnosis not present

## 2020-06-11 DIAGNOSIS — M810 Age-related osteoporosis without current pathological fracture: Secondary | ICD-10-CM | POA: Diagnosis not present

## 2020-06-17 DIAGNOSIS — W010XXA Fall on same level from slipping, tripping and stumbling without subsequent striking against object, initial encounter: Secondary | ICD-10-CM | POA: Diagnosis not present

## 2020-06-17 DIAGNOSIS — S42251A Displaced fracture of greater tuberosity of right humerus, initial encounter for closed fracture: Secondary | ICD-10-CM | POA: Diagnosis not present

## 2020-06-17 DIAGNOSIS — S42294A Other nondisplaced fracture of upper end of right humerus, initial encounter for closed fracture: Secondary | ICD-10-CM | POA: Diagnosis not present

## 2020-06-17 DIAGNOSIS — M25511 Pain in right shoulder: Secondary | ICD-10-CM | POA: Diagnosis not present

## 2020-07-01 DIAGNOSIS — S42251D Displaced fracture of greater tuberosity of right humerus, subsequent encounter for fracture with routine healing: Secondary | ICD-10-CM | POA: Diagnosis not present

## 2020-07-01 DIAGNOSIS — M25511 Pain in right shoulder: Secondary | ICD-10-CM | POA: Diagnosis not present

## 2020-07-01 DIAGNOSIS — S42291D Other displaced fracture of upper end of right humerus, subsequent encounter for fracture with routine healing: Secondary | ICD-10-CM | POA: Diagnosis not present

## 2020-07-01 DIAGNOSIS — W19XXXD Unspecified fall, subsequent encounter: Secondary | ICD-10-CM | POA: Diagnosis not present

## 2020-07-08 ENCOUNTER — Ambulatory Visit: Payer: Medicare HMO

## 2020-07-20 DIAGNOSIS — M25511 Pain in right shoulder: Secondary | ICD-10-CM | POA: Diagnosis not present

## 2020-07-20 DIAGNOSIS — M25411 Effusion, right shoulder: Secondary | ICD-10-CM | POA: Diagnosis not present

## 2020-07-27 DIAGNOSIS — E213 Hyperparathyroidism, unspecified: Secondary | ICD-10-CM | POA: Diagnosis not present

## 2020-07-27 DIAGNOSIS — E210 Primary hyperparathyroidism: Secondary | ICD-10-CM | POA: Diagnosis not present

## 2020-07-28 DIAGNOSIS — M25511 Pain in right shoulder: Secondary | ICD-10-CM | POA: Diagnosis not present

## 2020-07-28 DIAGNOSIS — M25411 Effusion, right shoulder: Secondary | ICD-10-CM | POA: Diagnosis not present

## 2020-07-31 DIAGNOSIS — M62838 Other muscle spasm: Secondary | ICD-10-CM | POA: Diagnosis not present

## 2020-07-31 DIAGNOSIS — S42251D Displaced fracture of greater tuberosity of right humerus, subsequent encounter for fracture with routine healing: Secondary | ICD-10-CM | POA: Diagnosis not present

## 2020-07-31 DIAGNOSIS — S42291D Other displaced fracture of upper end of right humerus, subsequent encounter for fracture with routine healing: Secondary | ICD-10-CM | POA: Diagnosis not present

## 2020-07-31 DIAGNOSIS — W010XXD Fall on same level from slipping, tripping and stumbling without subsequent striking against object, subsequent encounter: Secondary | ICD-10-CM | POA: Diagnosis not present

## 2020-07-31 DIAGNOSIS — M25511 Pain in right shoulder: Secondary | ICD-10-CM | POA: Diagnosis not present

## 2020-08-04 DIAGNOSIS — E213 Hyperparathyroidism, unspecified: Secondary | ICD-10-CM | POA: Diagnosis not present

## 2020-08-04 DIAGNOSIS — E210 Primary hyperparathyroidism: Secondary | ICD-10-CM | POA: Diagnosis not present

## 2020-08-06 DIAGNOSIS — M25511 Pain in right shoulder: Secondary | ICD-10-CM | POA: Diagnosis not present

## 2020-08-06 DIAGNOSIS — M25411 Effusion, right shoulder: Secondary | ICD-10-CM | POA: Diagnosis not present

## 2020-08-10 DIAGNOSIS — M25411 Effusion, right shoulder: Secondary | ICD-10-CM | POA: Diagnosis not present

## 2020-08-10 DIAGNOSIS — M25511 Pain in right shoulder: Secondary | ICD-10-CM | POA: Diagnosis not present

## 2020-08-17 DIAGNOSIS — M25511 Pain in right shoulder: Secondary | ICD-10-CM | POA: Diagnosis not present

## 2020-08-17 DIAGNOSIS — M25411 Effusion, right shoulder: Secondary | ICD-10-CM | POA: Diagnosis not present

## 2020-08-24 DIAGNOSIS — M25511 Pain in right shoulder: Secondary | ICD-10-CM | POA: Diagnosis not present

## 2020-08-24 DIAGNOSIS — M25411 Effusion, right shoulder: Secondary | ICD-10-CM | POA: Diagnosis not present

## 2020-09-01 DIAGNOSIS — M25411 Effusion, right shoulder: Secondary | ICD-10-CM | POA: Diagnosis not present

## 2020-09-01 DIAGNOSIS — M25511 Pain in right shoulder: Secondary | ICD-10-CM | POA: Diagnosis not present

## 2020-09-09 ENCOUNTER — Other Ambulatory Visit: Payer: Self-pay | Admitting: *Deleted

## 2020-09-09 DIAGNOSIS — S42291D Other displaced fracture of upper end of right humerus, subsequent encounter for fracture with routine healing: Secondary | ICD-10-CM | POA: Diagnosis not present

## 2020-09-09 DIAGNOSIS — M25511 Pain in right shoulder: Secondary | ICD-10-CM | POA: Diagnosis not present

## 2020-09-09 DIAGNOSIS — M62838 Other muscle spasm: Secondary | ICD-10-CM | POA: Diagnosis not present

## 2020-09-09 DIAGNOSIS — M7501 Adhesive capsulitis of right shoulder: Secondary | ICD-10-CM | POA: Diagnosis not present

## 2020-09-09 DIAGNOSIS — N200 Calculus of kidney: Secondary | ICD-10-CM

## 2020-09-09 DIAGNOSIS — S42251D Displaced fracture of greater tuberosity of right humerus, subsequent encounter for fracture with routine healing: Secondary | ICD-10-CM | POA: Diagnosis not present

## 2020-09-09 DIAGNOSIS — W010XXD Fall on same level from slipping, tripping and stumbling without subsequent striking against object, subsequent encounter: Secondary | ICD-10-CM | POA: Diagnosis not present

## 2020-09-09 NOTE — Progress Notes (Signed)
09/10/2020 2:03 PM   Michele Sanders June 25, 1955 [id number]  Referring provider: Patrice Paradise, MD [address] [facility] [address] [facility] [place],  [place] [address]  Urological history: 1. Nephrolithiasis -between 2005-2010 she had 2-3 stones which were removed ureteroscopically -CT renal stone 03/2020 Large renal calculi clustered at the left lower pole where there appears to be chronic calyceal obstruction/dilatation  2. Left renal lesion -CT renal stone 03/2020 6 mm left renal lesion favoring hemorrhagic cyst  Chief Complaint  Patient presents with   Nephrolithiasis   HPI: Michele Sanders is a 65 y.o. female who presents today for left flank pain.  She has been experiencing left-sided flank pain since Tuesday.  It has been associated with frequency, dysuria and some chills.  Patient denies any modifying or aggravating factors.  Patient denies any gross hematuria, dysuria or suprapubic/flank pain.  Patient denies any fevers, nausea or vomiting.    KUB cluster of stones in the left lower pole with largest 1.2 cm  UA > 30 WBC's, 3-10 RBC's and few bacteria.  PMH: Past Medical History:  Diagnosis Date   Hypertension     Surgical History: Past Surgical History:  Procedure Laterality Date   APPENDECTOMY     BACK SURGERY     BREAST BIOPSY Left 04/09/2019   neg   BREAST CYST ASPIRATION Left 04/08/2019   neg    Home Medications:  Allergies as of 09/10/2020   No Known Allergies      Medication List        Accurate as of September 10, 2020  2:03 PM. If you have any questions, ask your nurse or doctor.          aspirin 81 MG chewable tablet Chew by mouth daily.   CALCIUM + D3 PO Take by mouth.   Chantix Starting Month Pak 0.5 MG X 11 & 1 MG X 42 tablet Generic drug: varenicline TAKE AS DIRECTED PER PACKAGE INSTRUCTIONS   lisinopril 10 MG tablet Commonly known as: ZESTRIL Take 10 mg by mouth daily.   NEOMYCIN-POLYMYXIN-HYDROCORTISONE 1 % Soln OTIC  solution Commonly known as: CORTISPORIN Apply 1-2 drops to toe BID after soaking   pantoprazole 40 MG tablet Commonly known as: Protonix Take 1 tablet (40 mg total) by mouth daily.   simvastatin 40 MG tablet Commonly known as: ZOCOR Take by mouth.   sulfamethoxazole-trimethoprim 800-160 MG tablet Commonly known as: BACTRIM DS Take 1 tablet by mouth every 12 (twelve) hours. Started by: Michiel Cowboy, PA-C        Allergies: No Known Allergies  Family History: Family History  Problem Relation Age of Onset   Breast cancer Neg Hx     Social History:  reports that she has been smoking. She has been smoking an average of 0.50 packs per day. She has never used smokeless tobacco. She reports current alcohol use. No history on file for drug use.  ROS: Pertinent ROS in HPI  Physical Exam: BP (!) 142/83   Pulse 92   Ht 5\' 4"  (1.626 m)   Wt 180 lb (81.6 kg)   BMI 30.90 kg/m   Constitutional:  Well nourished. Alert and oriented, No acute distress. HEENT: [place] AT, mask in place.  Trachea midline Cardiovascular: No clubbing, cyanosis, or edema. Respiratory: Normal respiratory effort, no increased work of breathing. Neurologic: Grossly intact, no focal deficits, moving all 4 extremities. Psychiatric: Normal mood and affect.    Laboratory Data: Parathyroid Hormone (PTH), Intact 14 - 72 pg/mL 89  High    Resulting Agency  [facility]  Specimen Collected: 08/04/20 11:08 Last Resulted: 08/04/20 12:17  Received From: [facility]  Result Received: 09/09/20 10:46   Calcium, Ionized, Whole Blood 1.15 - 1.32 mmol/L 1.23   Resulting Agency  [facility]  Specimen Collected: 08/04/20 11:08 Last Resulted: 08/04/20 11:21  Received From: [facility]  Result Received: 09/09/20 10:46   Sodium 135 - 145 mmol/L 137   Potassium 3.5 - 5.0 mmol/L 3.9   Chloride 98 - 108 mmol/L 101   Carbon Dioxide (CO2) 21 - 30 mmol/L  24   Urea Nitrogen (BUN) 7 - 20 mg/dL 13   Creatinine 0.4 - 1.0 mg/dL 0.7   Calcium 8.7 - 16.1 mg/dL 09.6   Phosphorus 2.3 - 4.5 mg/dL 3.3   Albumin 3.5 - 4.8 g/dL 4.3   Glomerular Filtration Rate (eGFR)  mL/min/1.73sq m 95   Comment: CKD-EPI (2021) does not include patient's race in the calculation of eGFR. Monitoring changes of plasma creatinine and eGFR over time is useful for monitoring kidney function.  This change was made on 05/05/2020.   Interpretive Ranges for eGFR(CKD-EPI 2021):   eGFR:              > 60 mL/min/1.73 sq m - Normal  eGFR:              30 - 59 mL/min/1.73 sq m - Moderately Decreased  eGFR:              15 - 29 mL/min/1.73 sq m - Severely Decreased  eGFR:              < 15 mL/min/1.73 sq m -  Kidney Failure    Note: These eGFR calculations do not apply in acute situations  when eGFR is changing rapidly or in patients on dialysis.   Glucose 70 - 140 mg/dL 045   Comment: Interpretive Data:  Above is the NONFASTING reference range.   Below are the FASTING reference ranges:  NORMAL:      70-99 mg/dL  PREDIABETES: 409-811 mg/dL  DIABETES:    > 914 mg/dL   Anion Gap 3 - 12 mmol/L 12   BUN/CREA Ratio 6 - 27 19   Resulting Agency  [facility]  Specimen Collected: 08/04/20 11:08 Last Resulted: 08/04/20 11:40  Received From: [facility]  Result Received: 09/09/20 10:46   Urinalysis Component     Latest Ref Rng & Units 09/10/2020  Specific Gravity, UA     1.005 - 1.030 1.025  pH, UA     5.0 - 7.5 5.5  Color, UA     Yellow Yellow  Appearance Ur     Clear Hazy (A)  Leukocytes,UA     Negative 2+ (A)  Protein,UA     Negative/Trace Negative  Glucose, UA     Negative Negative  Ketones, UA     Negative Negative  RBC, UA     Negative 2+ (A)  Bilirubin, UA     Negative Negative  Urobilinogen, Ur     0.2 - 1.0 mg/dL 0.2  Nitrite, UA     Negative Negative  Microscopic Examination      See below:   Component      Latest Ref Rng & Units 09/10/2020  WBC, UA     0 - 5 /hpf >30 (A)  RBC     0 - 2 /hpf 3-10 (  A)  Epithelial Cells (non renal)     0 - 10 /hpf 0-10  Bacteria, UA     None seen/Few Few   I have reviewed the labs.   Pertinent Imaging: Narrative & Impression  CLINICAL DATA:  Kidney stone.  Left flank pain.   EXAM: ABDOMEN - 1 VIEW   COMPARISON:  CT renal 04/06/2020.   FINDINGS: The bowel gas pattern is normal. Multiple calcifications overlie the left renal shadow with the largest stone measuring approximately 14 mm. Coarsely calcified round lesion overlying the pelvis.   IMPRESSION: 1. Similar-appearing multiple calcifications overlie the left renal shadow with the largest stone measuring approximately 14 mm. 2. Coarsely calcified round lesion overlying consistent with known degenerative uterine fibroid.     Electronically Signed   By: Tish Frederickson M.D.   On: 09/10/2020 23:43     I have independently reviewed the films.    Assessment & Plan:    1. Left renal stone - schedule left ureteroscopy with laser lithotripsy and ureteral stent placement - explained to the patient how the procedure is performed and the risks involved - informed patient that they will have a stent placed during the procedure and will remain in place after the procedure for a short time.  - stent may be removed in the office with a cystoscope or patient may be instructed to remove the stent themselves by the string - described "stent pain" as feelings of needing to urinate/overactive bladder and a warm, tingling sensation to intense pain in the affected flank - residual stones within the kidney or ureter may be present after the procedure and may need to have these addressed at a different encounter, explained that her stone will likely need a staged procedure  - injury to the ureter is the most common intra-operative risk, it may result in an open procedure to correct the defect - infection and  bleeding are also risks - explained the risks of general anesthesia, such as: MI, CVA, paralysis, coma and/or death. - advised to contact our office or seek treatment in the ED if becomes febrile or pain/ vomiting are difficult control in order to arrange for emergent/urgent intervention -UA -urine culture -Septra DS, BID x 7 days sent to pharmacy    Return for left URS/LL/ureteral stent placement.  These notes generated with voice recognition software. I apologize for typographical errors.  Michiel Cowboy, PA-C  [facility] [address]  [address] [place], [place] [address] [phone]

## 2020-09-09 NOTE — H&P (View-Only) (Signed)
09/10/2020 2:03 PM   Michele Sanders June 25, 1955 [id number]  Referring provider: Patrice Paradise, MD [address] [facility] [address] [facility] [place],  [place] [address]  Urological history: 1. Nephrolithiasis -between 2005-2010 she had 2-3 stones which were removed ureteroscopically -CT renal stone 03/2020 Large renal calculi clustered at the left lower pole where there appears to be chronic calyceal obstruction/dilatation  2. Left renal lesion -CT renal stone 03/2020 6 mm left renal lesion favoring hemorrhagic cyst  Chief Complaint  Patient presents with   Nephrolithiasis   HPI: Michele Sanders is a 65 y.o. female who presents today for left flank pain.  She has been experiencing left-sided flank pain since Tuesday.  It has been associated with frequency, dysuria and some chills.  Patient denies any modifying or aggravating factors.  Patient denies any gross hematuria, dysuria or suprapubic/flank pain.  Patient denies any fevers, nausea or vomiting.    KUB cluster of stones in the left lower pole with largest 1.2 cm  UA > 30 WBC's, 3-10 RBC's and few bacteria.  PMH: Past Medical History:  Diagnosis Date   Hypertension     Surgical History: Past Surgical History:  Procedure Laterality Date   APPENDECTOMY     BACK SURGERY     BREAST BIOPSY Left 04/09/2019   neg   BREAST CYST ASPIRATION Left 04/08/2019   neg    Home Medications:  Allergies as of 09/10/2020   No Known Allergies      Medication List        Accurate as of September 10, 2020  2:03 PM. If you have any questions, ask your nurse or doctor.          aspirin 81 MG chewable tablet Chew by mouth daily.   CALCIUM + D3 PO Take by mouth.   Chantix Starting Month Pak 0.5 MG X 11 & 1 MG X 42 tablet Generic drug: varenicline TAKE AS DIRECTED PER PACKAGE INSTRUCTIONS   lisinopril 10 MG tablet Commonly known as: ZESTRIL Take 10 mg by mouth daily.   NEOMYCIN-POLYMYXIN-HYDROCORTISONE 1 % Soln OTIC  solution Commonly known as: CORTISPORIN Apply 1-2 drops to toe BID after soaking   pantoprazole 40 MG tablet Commonly known as: Protonix Take 1 tablet (40 mg total) by mouth daily.   simvastatin 40 MG tablet Commonly known as: ZOCOR Take by mouth.   sulfamethoxazole-trimethoprim 800-160 MG tablet Commonly known as: BACTRIM DS Take 1 tablet by mouth every 12 (twelve) hours. Started by: Michiel Cowboy, PA-C        Allergies: No Known Allergies  Family History: Family History  Problem Relation Age of Onset   Breast cancer Neg Hx     Social History:  reports that she has been smoking. She has been smoking an average of 0.50 packs per day. She has never used smokeless tobacco. She reports current alcohol use. No history on file for drug use.  ROS: Pertinent ROS in HPI  Physical Exam: BP (!) 142/83   Pulse 92   Ht 5\' 4"  (1.626 m)   Wt 180 lb (81.6 kg)   BMI 30.90 kg/m   Constitutional:  Well nourished. Alert and oriented, No acute distress. HEENT: [place] AT, mask in place.  Trachea midline Cardiovascular: No clubbing, cyanosis, or edema. Respiratory: Normal respiratory effort, no increased work of breathing. Neurologic: Grossly intact, no focal deficits, moving all 4 extremities. Psychiatric: Normal mood and affect.    Laboratory Data: Parathyroid Hormone (PTH), Intact 14 - 72 pg/mL 89  High    Resulting Agency  [facility]  Specimen Collected: 08/04/20 11:08 Last Resulted: 08/04/20 12:17  Received From: [facility]  Result Received: 09/09/20 10:46   Calcium, Ionized, Whole Blood 1.15 - 1.32 mmol/L 1.23   Resulting Agency  [facility]  Specimen Collected: 08/04/20 11:08 Last Resulted: 08/04/20 11:21  Received From: [facility]  Result Received: 09/09/20 10:46   Sodium 135 - 145 mmol/L 137   Potassium 3.5 - 5.0 mmol/L 3.9   Chloride 98 - 108 mmol/L 101   Carbon Dioxide (CO2) 21 - 30 mmol/L  24   Urea Nitrogen (BUN) 7 - 20 mg/dL 13   Creatinine 0.4 - 1.0 mg/dL 0.7   Calcium 8.7 - 16.1 mg/dL 09.6   Phosphorus 2.3 - 4.5 mg/dL 3.3   Albumin 3.5 - 4.8 g/dL 4.3   Glomerular Filtration Rate (eGFR)  mL/min/1.73sq m 95   Comment: CKD-EPI (2021) does not include patient's race in the calculation of eGFR. Monitoring changes of plasma creatinine and eGFR over time is useful for monitoring kidney function.  This change was made on 05/05/2020.   Interpretive Ranges for eGFR(CKD-EPI 2021):   eGFR:              > 60 mL/min/1.73 sq m - Normal  eGFR:              30 - 59 mL/min/1.73 sq m - Moderately Decreased  eGFR:              15 - 29 mL/min/1.73 sq m - Severely Decreased  eGFR:              < 15 mL/min/1.73 sq m -  Kidney Failure    Note: These eGFR calculations do not apply in acute situations  when eGFR is changing rapidly or in patients on dialysis.   Glucose 70 - 140 mg/dL 045   Comment: Interpretive Data:  Above is the NONFASTING reference range.   Below are the FASTING reference ranges:  NORMAL:      70-99 mg/dL  PREDIABETES: 409-811 mg/dL  DIABETES:    > 914 mg/dL   Anion Gap 3 - 12 mmol/L 12   BUN/CREA Ratio 6 - 27 19   Resulting Agency  [facility]  Specimen Collected: 08/04/20 11:08 Last Resulted: 08/04/20 11:40  Received From: [facility]  Result Received: 09/09/20 10:46   Urinalysis Component     Latest Ref Rng & Units 09/10/2020  Specific Gravity, UA     1.005 - 1.030 1.025  pH, UA     5.0 - 7.5 5.5  Color, UA     Yellow Yellow  Appearance Ur     Clear Hazy (A)  Leukocytes,UA     Negative 2+ (A)  Protein,UA     Negative/Trace Negative  Glucose, UA     Negative Negative  Ketones, UA     Negative Negative  RBC, UA     Negative 2+ (A)  Bilirubin, UA     Negative Negative  Urobilinogen, Ur     0.2 - 1.0 mg/dL 0.2  Nitrite, UA     Negative Negative  Microscopic Examination      See below:   Component      Latest Ref Rng & Units 09/10/2020  WBC, UA     0 - 5 /hpf >30 (A)  RBC     0 - 2 /hpf 3-10 (  A)  Epithelial Cells (non renal)     0 - 10 /hpf 0-10  Bacteria, UA     None seen/Few Few   I have reviewed the labs.   Pertinent Imaging: Narrative & Impression  CLINICAL DATA:  Kidney stone.  Left flank pain.   EXAM: ABDOMEN - 1 VIEW   COMPARISON:  CT renal 04/06/2020.   FINDINGS: The bowel gas pattern is normal. Multiple calcifications overlie the left renal shadow with the largest stone measuring approximately 14 mm. Coarsely calcified round lesion overlying the pelvis.   IMPRESSION: 1. Similar-appearing multiple calcifications overlie the left renal shadow with the largest stone measuring approximately 14 mm. 2. Coarsely calcified round lesion overlying consistent with known degenerative uterine fibroid.     Electronically Signed   By: Tish Frederickson M.D.   On: 09/10/2020 23:43     I have independently reviewed the films.    Assessment & Plan:    1. Left renal stone - schedule left ureteroscopy with laser lithotripsy and ureteral stent placement - explained to the patient how the procedure is performed and the risks involved - informed patient that they will have a stent placed during the procedure and will remain in place after the procedure for a short time.  - stent may be removed in the office with a cystoscope or patient may be instructed to remove the stent themselves by the string - described "stent pain" as feelings of needing to urinate/overactive bladder and a warm, tingling sensation to intense pain in the affected flank - residual stones within the kidney or ureter may be present after the procedure and may need to have these addressed at a different encounter, explained that her stone will likely need a staged procedure  - injury to the ureter is the most common intra-operative risk, it may result in an open procedure to correct the defect - infection and  bleeding are also risks - explained the risks of general anesthesia, such as: MI, CVA, paralysis, coma and/or death. - advised to contact our office or seek treatment in the ED if becomes febrile or pain/ vomiting are difficult control in order to arrange for emergent/urgent intervention -UA -urine culture -Septra DS, BID x 7 days sent to pharmacy    Return for left URS/LL/ureteral stent placement.  These notes generated with voice recognition software. I apologize for typographical errors.  Michiel Cowboy, PA-C  [facility] [address]  [address] [place], [place] [address] [phone]

## 2020-09-10 ENCOUNTER — Ambulatory Visit
Admission: RE | Admit: 2020-09-10 | Discharge: 2020-09-10 | Disposition: A | Discharge status: Home / Self Care | Payer: Medicare HMO | Source: Ambulatory Visit | Attending: Urology | Admitting: Urology

## 2020-09-10 ENCOUNTER — Ambulatory Visit: Payer: Medicare HMO | Admitting: Urology

## 2020-09-10 ENCOUNTER — Other Ambulatory Visit: Payer: Self-pay

## 2020-09-10 VITALS — BP 142/83 | HR 92 | Ht 64.0 in | Wt 180.0 lb

## 2020-09-10 DIAGNOSIS — N200 Calculus of kidney: Secondary | ICD-10-CM | POA: Diagnosis not present

## 2020-09-10 DIAGNOSIS — N3001 Acute cystitis with hematuria: Secondary | ICD-10-CM

## 2020-09-10 DIAGNOSIS — R109 Unspecified abdominal pain: Secondary | ICD-10-CM | POA: Diagnosis not present

## 2020-09-10 MED ORDER — SULFAMETHOXAZOLE-TRIMETHOPRIM 800-160 MG PO TABS: 1.0000 | ORAL_TABLET | Freq: Two times a day (BID) | ORAL | 0 refills | Status: DC

## 2020-09-11 LAB — MICROSCOPIC EXAMINATION: WBC, UA: >30 /hpf — AB (ref 0–5)

## 2020-09-11 LAB — URINALYSIS, COMPLETE
Bilirubin, UA: NEGATIVE
Glucose, UA: NEGATIVE
Ketones, UA: NEGATIVE
Nitrite, UA: NEGATIVE
Protein,UA: NEGATIVE
Specific Gravity, UA: 1.025 (ref 1.005–1.030)
Urobilinogen, Ur: 0.2 mg/dL (ref 0.2–1.0)
pH, UA: 5.5 (ref 5.0–7.5)

## 2020-09-13 ENCOUNTER — Encounter: Payer: Self-pay | Admitting: Urology

## 2020-09-13 LAB — CULTURE, URINE COMPREHENSIVE

## 2020-09-15 DIAGNOSIS — M25411 Effusion, right shoulder: Secondary | ICD-10-CM | POA: Diagnosis not present

## 2020-09-15 DIAGNOSIS — M25511 Pain in right shoulder: Secondary | ICD-10-CM | POA: Diagnosis not present

## 2020-09-16 ENCOUNTER — Other Ambulatory Visit: Payer: Self-pay | Admitting: Urology

## 2020-09-16 DIAGNOSIS — N200 Calculus of kidney: Secondary | ICD-10-CM

## 2020-09-16 DIAGNOSIS — R7303 Prediabetes: Secondary | ICD-10-CM | POA: Diagnosis not present

## 2020-09-16 DIAGNOSIS — E782 Mixed hyperlipidemia: Secondary | ICD-10-CM | POA: Diagnosis not present

## 2020-09-16 DIAGNOSIS — I10 Essential (primary) hypertension: Secondary | ICD-10-CM | POA: Diagnosis not present

## 2020-09-21 ENCOUNTER — Encounter
Admission: RE | Admit: 2020-09-21 | Discharge: 2020-09-21 | Disposition: A | Discharge status: Home / Self Care | Payer: Medicare HMO | Source: Ambulatory Visit | Attending: Urology | Admitting: Urology

## 2020-09-21 ENCOUNTER — Other Ambulatory Visit: Payer: Self-pay

## 2020-09-21 HISTORY — DX: Personal history of urinary calculi: Z87.442

## 2020-09-21 HISTORY — DX: Unspecified osteoarthritis, unspecified site: M19.90

## 2020-09-21 HISTORY — DX: Hyperparathyroidism, unspecified: E21.3

## 2020-09-21 HISTORY — DX: Gastro-esophageal reflux disease without esophagitis: K21.9

## 2020-09-21 NOTE — Patient Instructions (Addendum)
Your procedure is scheduled on:09-29-20 Tuesday Report to the Registration Desk on the 1st floor of the [facility]-Then proceed to the 2nd floor Surgery Desk in the [facility] To find out your arrival time, please call [phone] between 1PM - 3PM on:09-28-20 Monday  REMEMBER: Instructions that are not followed completely may result in serious medical risk, up to and including death; or upon the discretion of your surgeon and anesthesiologist your surgery may need to be rescheduled.  Do not eat food OR drink any liquids after midnight the night before surgery.  No gum chewing, lozengers or hard candies.  TAKE THESE MEDICATIONS THE MORNING OF SURGERY WITH A SIP OF WATER: Pantoprazole (Protonix)-take one the night before and one on the morning of surgery - helps to prevent nausea after surgery.)  Stop your 81 mg Aspirin NOW 09-21-20 Monday  One week prior to surgery: Stop Anti-inflammatories (NSAIDS) such as Advil, Aleve, Ibuprofen, Motrin, Naproxen, Naprosyn and Aspirin based products such as Excedrin, Goodys Powder, BC Powder.You may however, continue to take Tylenol if needed for pain up until the day of surgery.  Stop ANY OVER THE COUNTER supplements/vitamins NOW 09-21-20 until after surgery(Melatonin, CBD Oil,Zinc, Magnesium and Vitamin D3)  No Alcohol for 24 hours before or after surgery.  No Smoking including e-cigarettes for 24 hours prior to surgery.  No chewable tobacco products for at least 6 hours prior to surgery.  No nicotine patches on the day of surgery.  Do not use any "recreational" drugs for at least a week prior to your surgery.  Please be advised that the combination of cocaine and anesthesia may have negative outcomes, up to and including death. If you test positive for cocaine, your surgery will be cancelled.  On the morning of surgery brush your teeth with toothpaste and water, you may rinse your mouth with mouthwash if you wish. Do not swallow any toothpaste  or mouthwash.  Do not wear jewelry, make-up, hairpins, clips or nail polish.  Do not wear lotions, powders, or perfumes.   Do not shave body from the neck down 48 hours prior to surgery just in case you cut yourself which could leave a site for infection.  Also, freshly shaved skin may become irritated if using the CHG soap.  Contact lenses, hearing aids and dentures may not be worn into surgery.  Do not bring valuables to the hospital. [facility] is not responsible for any missing/lost belongings or valuables.   Notify your doctor if there is any change in your medical condition (cold, fever, infection).  Wear comfortable clothing (specific to your surgery type) to the hospital.  After surgery, you can help prevent lung complications by doing breathing exercises.  Take deep breaths and cough every 1-2 hours. Your doctor may order a device called an Incentive Spirometer to help you take deep breaths. When coughing or sneezing, hold a pillow firmly against your incision with both hands. This is called "splinting." Doing this helps protect your incision. It also decreases belly discomfort.  If you are being admitted to the hospital overnight, leave your suitcase in the car. After surgery it may be brought to your room.  If you are being discharged the day of surgery, you will not be allowed to drive home. You will need a responsible adult (18 years or older) to drive you home and stay with you that night.   If you are taking public transportation, you will need to have a responsible adult (18 years or older)  with you. Please confirm with your physician that it is acceptable to use public transportation.   Please call the [place]. at [phone] if you have any questions about these instructions.  Surgery Visitation Policy:  Patients undergoing a surgery or procedure may have one family member or support person with them as long as that person is not COVID-19  positive or experiencing its symptoms.  That person may remain in the waiting area during the procedure.  Inpatient Visitation:    Visiting hours are 7 a.m. to 8 p.m. Inpatients will be allowed two visitors daily. The visitors may change each day during the patient's stay. No visitors under the age of 77. Any visitor under the age of 33 must be accompanied by an adult. The visitor must pass COVID-19 screenings, use hand sanitizer when entering and exiting the patient's room and wear a mask at all times, including in the patient's room. Patients must also wear a mask when staff or their visitor are in the room. Masking is required regardless of vaccination status.

## 2020-09-22 DIAGNOSIS — M25411 Effusion, right shoulder: Secondary | ICD-10-CM | POA: Diagnosis not present

## 2020-09-22 DIAGNOSIS — M25511 Pain in right shoulder: Secondary | ICD-10-CM | POA: Diagnosis not present

## 2020-09-23 DIAGNOSIS — E782 Mixed hyperlipidemia: Secondary | ICD-10-CM | POA: Diagnosis not present

## 2020-09-23 DIAGNOSIS — M810 Age-related osteoporosis without current pathological fracture: Secondary | ICD-10-CM | POA: Diagnosis not present

## 2020-09-23 DIAGNOSIS — Z0000 Encounter for general adult medical examination without abnormal findings: Secondary | ICD-10-CM | POA: Diagnosis not present

## 2020-09-23 DIAGNOSIS — E213 Hyperparathyroidism, unspecified: Secondary | ICD-10-CM | POA: Diagnosis not present

## 2020-09-23 DIAGNOSIS — S42201D Unspecified fracture of upper end of right humerus, subsequent encounter for fracture with routine healing: Secondary | ICD-10-CM | POA: Diagnosis not present

## 2020-09-23 DIAGNOSIS — I10 Essential (primary) hypertension: Secondary | ICD-10-CM | POA: Diagnosis not present

## 2020-09-23 DIAGNOSIS — R7303 Prediabetes: Secondary | ICD-10-CM | POA: Diagnosis not present

## 2020-09-24 ENCOUNTER — Other Ambulatory Visit: Payer: Self-pay

## 2020-09-24 ENCOUNTER — Encounter
Admission: RE | Admit: 2020-09-24 | Discharge: 2020-09-24 | Disposition: A | Discharge status: Home / Self Care | Payer: Medicare HMO | Source: Ambulatory Visit | Attending: Urology | Admitting: Urology

## 2020-09-24 DIAGNOSIS — Z01810 Encounter for preprocedural cardiovascular examination: Secondary | ICD-10-CM | POA: Insufficient documentation

## 2020-09-24 DIAGNOSIS — Z01818 Encounter for other preprocedural examination: Secondary | ICD-10-CM | POA: Diagnosis not present

## 2020-09-28 DIAGNOSIS — M25611 Stiffness of right shoulder, not elsewhere classified: Secondary | ICD-10-CM | POA: Diagnosis not present

## 2020-09-29 ENCOUNTER — Encounter: Payer: Self-pay | Admitting: Urology

## 2020-09-29 ENCOUNTER — Ambulatory Visit: Payer: Medicare HMO | Admitting: Anesthesiology

## 2020-09-29 ENCOUNTER — Encounter
Admission: RE | Disposition: A | Discharge status: Home / Self Care | Payer: Self-pay | Source: Home / Self Care | Attending: Urology

## 2020-09-29 ENCOUNTER — Other Ambulatory Visit: Payer: Self-pay

## 2020-09-29 ENCOUNTER — Ambulatory Visit: Payer: Medicare HMO

## 2020-09-29 ENCOUNTER — Ambulatory Visit
Admission: RE | Admit: 2020-09-29 | Discharge: 2020-09-29 | Disposition: A | Discharge status: Home / Self Care | Payer: Medicare HMO | Attending: Urology | Admitting: Urology

## 2020-09-29 DIAGNOSIS — Z79899 Other long term (current) drug therapy: Secondary | ICD-10-CM | POA: Insufficient documentation

## 2020-09-29 DIAGNOSIS — Z87442 Personal history of urinary calculi: Secondary | ICD-10-CM | POA: Diagnosis not present

## 2020-09-29 DIAGNOSIS — Z7982 Long term (current) use of aspirin: Secondary | ICD-10-CM | POA: Diagnosis not present

## 2020-09-29 DIAGNOSIS — N200 Calculus of kidney: Secondary | ICD-10-CM | POA: Diagnosis not present

## 2020-09-29 DIAGNOSIS — F17200 Nicotine dependence, unspecified, uncomplicated: Secondary | ICD-10-CM | POA: Insufficient documentation

## 2020-09-29 HISTORY — PX: CYSTOSCOPY/URETEROSCOPY/HOLMIUM LASER/STENT PLACEMENT: SHX6546

## 2020-09-29 SURGERY — CYSTOSCOPY/URETEROSCOPY/HOLMIUM LASER/STENT PLACEMENT
Anesthesia: General | Site: Ureter | Laterality: Left

## 2020-09-29 MED ORDER — FENTANYL CITRATE (PF) 100 MCG/2ML IJ SOLN
INTRAMUSCULAR | Status: DC | PRN
  Administered 2020-09-29 (×2): 50 ug via INTRAVENOUS

## 2020-09-29 MED ORDER — DEXAMETHASONE SODIUM PHOSPHATE 10 MG/ML IJ SOLN
INTRAMUSCULAR | Status: DC | PRN
  Administered 2020-09-29: 5 mg via INTRAVENOUS

## 2020-09-29 MED ORDER — IOHEXOL 180 MG/ML  SOLN
INTRAMUSCULAR | Status: DC | PRN
  Administered 2020-09-29: 10 mL

## 2020-09-29 MED ORDER — LACTATED RINGERS IV SOLN: INTRAVENOUS | Status: DC

## 2020-09-29 MED ORDER — ACETAMINOPHEN 10 MG/ML IV SOLN
INTRAVENOUS | Status: AC
  Filled 2020-09-29: qty 100

## 2020-09-29 MED ORDER — TAMSULOSIN HCL 0.4 MG PO CAPS: 0.4000 mg | ORAL_CAPSULE | Freq: Every day | ORAL | 0 refills | Status: DC

## 2020-09-29 MED ORDER — CHLORHEXIDINE GLUCONATE 0.12 % MT SOLN: 15.0000 mL | Freq: Once | OROMUCOSAL | Status: AC

## 2020-09-29 MED ORDER — CHLORHEXIDINE GLUCONATE 0.12 % MT SOLN
OROMUCOSAL | Status: AC
  Administered 2020-09-29: 15 mL via OROMUCOSAL
  Filled 2020-09-29: qty 15

## 2020-09-29 MED ORDER — ROCURONIUM BROMIDE 100 MG/10ML IV SOLN
INTRAVENOUS | Status: DC | PRN
  Administered 2020-09-29: 50 mg via INTRAVENOUS

## 2020-09-29 MED ORDER — ONDANSETRON HCL 4 MG/2ML IJ SOLN
INTRAMUSCULAR | Status: DC | PRN
  Administered 2020-09-29: 4 mg via INTRAVENOUS

## 2020-09-29 MED ORDER — SODIUM CHLORIDE 0.9 % IR SOLN
Status: DC | PRN
  Administered 2020-09-29: 1000 mL

## 2020-09-29 MED ORDER — OXYBUTYNIN CHLORIDE 5 MG PO TABS: ORAL_TABLET | ORAL | 1 refills | Status: DC

## 2020-09-29 MED ORDER — PROPOFOL 10 MG/ML IV BOLUS
INTRAVENOUS | Status: DC | PRN
  Administered 2020-09-29: 150 mg via INTRAVENOUS
  Administered 2020-09-29: 50 mg via INTRAVENOUS

## 2020-09-29 MED ORDER — LIDOCAINE HCL (PF) 2 % IJ SOLN
INTRAMUSCULAR | Status: AC
  Filled 2020-09-29: qty 5

## 2020-09-29 MED ORDER — FENTANYL CITRATE (PF) 100 MCG/2ML IJ SOLN: 25.0000 ug | INTRAMUSCULAR | Status: DC | PRN

## 2020-09-29 MED ORDER — HYDROCODONE-ACETAMINOPHEN 7.5-325 MG PO TABS: 1.0000 | ORAL_TABLET | Freq: Once | ORAL | Status: DC | PRN

## 2020-09-29 MED ORDER — MIDAZOLAM HCL 2 MG/2ML IJ SOLN
INTRAMUSCULAR | Status: AC
  Filled 2020-09-29: qty 2

## 2020-09-29 MED ORDER — CEFAZOLIN SODIUM-DEXTROSE 2-4 GM/100ML-% IV SOLN
2.0000 g | INTRAVENOUS | Status: AC
  Administered 2020-09-29: 2 g via INTRAVENOUS

## 2020-09-29 MED ORDER — FENTANYL CITRATE (PF) 100 MCG/2ML IJ SOLN
INTRAMUSCULAR | Status: AC
  Filled 2020-09-29: qty 2

## 2020-09-29 MED ORDER — CEFAZOLIN SODIUM-DEXTROSE 2-4 GM/100ML-% IV SOLN
INTRAVENOUS | Status: AC
  Filled 2020-09-29: qty 100

## 2020-09-29 MED ORDER — LIDOCAINE HCL (CARDIAC) PF 100 MG/5ML IV SOSY
PREFILLED_SYRINGE | INTRAVENOUS | Status: DC | PRN
  Administered 2020-09-29: 60 mg via INTRAVENOUS

## 2020-09-29 MED ORDER — ACETAMINOPHEN 10 MG/ML IV SOLN
INTRAVENOUS | Status: DC | PRN
  Administered 2020-09-29: 1000 mg via INTRAVENOUS

## 2020-09-29 MED ORDER — ORAL CARE MOUTH RINSE: 15.0000 mL | Freq: Once | OROMUCOSAL | Status: AC

## 2020-09-29 MED ORDER — MIDAZOLAM HCL 2 MG/2ML IJ SOLN
INTRAMUSCULAR | Status: DC | PRN
Start: 2020-09-29 — End: 2020-09-29
  Administered 2020-09-29 (×2): 1 mg via INTRAVENOUS

## 2020-09-29 SURGICAL SUPPLY — 34 items
BAG DRAIN CYSTO-URO LG1000N (MISCELLANEOUS) ×2 IMPLANT
BASKET ZERO TIP 1.9FR (BASKET) IMPLANT
BRUSH SCRUB EZ 1% IODOPHOR (MISCELLANEOUS) ×2 IMPLANT
BSKT STON RTRVL ZERO TP 1.9FR (BASKET)
CATH SET URETHRAL DILATOR (CATHETERS) ×2 IMPLANT
CATH URET FLEX-TIP 2 LUMEN 10F (CATHETERS) ×2 IMPLANT
CATH URETL 5X70 OPEN END (CATHETERS) IMPLANT
CNTNR SPEC 2.5X3XGRAD LEK (MISCELLANEOUS)
CONT SPEC 4OZ STER OR WHT (MISCELLANEOUS)
CONT SPEC 4OZ STRL OR WHT (MISCELLANEOUS)
CONTAINER SPEC 2.5X3XGRAD LEK (MISCELLANEOUS) IMPLANT
DRAPE UTILITY 15X26 TOWEL STRL (DRAPES) ×2 IMPLANT
GAUZE 4X4 16PLY ~~LOC~~+RFID DBL (SPONGE) ×4 IMPLANT
GLOVE SURG UNDER POLY LF SZ7.5 (GLOVE) ×2 IMPLANT
GOWN STRL REUS W/ TWL LRG LVL3 (GOWN DISPOSABLE) ×1 IMPLANT
GOWN STRL REUS W/ TWL XL LVL3 (GOWN DISPOSABLE) ×1 IMPLANT
GOWN STRL REUS W/TWL LRG LVL3 (GOWN DISPOSABLE) ×2
GOWN STRL REUS W/TWL XL LVL3 (GOWN DISPOSABLE) ×2
GUIDEWIRE GREEN .038 145CM (MISCELLANEOUS) ×2 IMPLANT
GUIDEWIRE STR DUAL SENSOR (WIRE) ×4 IMPLANT
INFUSOR MANOMETER BAG 3000ML (MISCELLANEOUS) ×2 IMPLANT
IV NS IRRIG 3000ML ARTHROMATIC (IV SOLUTION) ×2 IMPLANT
KIT TURNOVER CYSTO (KITS) ×2 IMPLANT
PACK CYSTO AR (MISCELLANEOUS) ×2 IMPLANT
SET CYSTO W/LG BORE CLAMP LF (SET/KITS/TRAYS/PACK) ×2 IMPLANT
SHEATH URETERAL 12FRX35CM (MISCELLANEOUS) IMPLANT
STENT CONTOUR 8FR X 24 (STENTS) IMPLANT
STENT DBL PTAIL 7X22 (STENTS) ×2 IMPLANT
STENT URET 6FRX24 CONTOUR (STENTS) IMPLANT
STENT URET 6FRX26 CONTOUR (STENTS) IMPLANT
SURGILUBE 2OZ TUBE FLIPTOP (MISCELLANEOUS) ×2 IMPLANT
TRACTIP FLEXIVA PULSE ID 200 (Laser) ×2 IMPLANT
VALVE UROSEAL ADJ ENDO (VALVE) ×2 IMPLANT
WATER STERILE IRR 1000ML POUR (IV SOLUTION) ×2 IMPLANT

## 2020-09-29 NOTE — Anesthesia Postprocedure Evaluation (Signed)
Anesthesia Post Note  Patient: Jersey Garbutt  Procedure(s) Performed: CYSTOSCOPY/URETEROSCOPY, /STENT PLACEMENT (Left: Ureter)  Patient location during evaluation: PACU Anesthesia Type: General Level of consciousness: awake and alert Pain management: pain level controlled Vital Signs Assessment: post-procedure vital signs reviewed and stable Respiratory status: respiratory function stable Cardiovascular status: stable and blood pressure returned to baseline Postop Assessment: no apparent nausea or vomiting Anesthetic complications: no   No notable events documented.   Last Vitals:  Vitals:   09/29/20 1400 09/29/20 1416  BP: (!) 168/86 (!) 159/86  Pulse: 75 75  Resp: 16 18  Temp: (!) 36.1 C (!) 35.9 C  SpO2: 92% 94%    Last Pain:  Vitals:   09/29/20 1416  TempSrc: Temporal  PainSc: 1                  Margaree Mackintosh

## 2020-09-29 NOTE — Transfer of Care (Signed)
Immediate Anesthesia Transfer of Care Note  Patient: Michele Sanders  Procedure(s) Performed: CYSTOSCOPY/URETEROSCOPY, /STENT PLACEMENT (Left: Ureter)  Patient Location: PACU  Anesthesia Type:MAC  Level of Consciousness: awake, oriented and patient cooperative  Airway & Oxygen Therapy: Patient connected to nasal cannula oxygen  Post-op Assessment: Report given to RN, Post -op Vital signs reviewed and stable, Patient moving all extremities and Patient able to stick tongue midline  Post vital signs: stable  Last Vitals:  Vitals Value Taken Time  BP 159/86 09/29/20 1416  Temp 35.9 C 09/29/20 1416  Pulse 75 09/29/20 1416  Resp 18 09/29/20 1416  SpO2 94 % 09/29/20 1416    Last Pain:  Vitals:   09/29/20 1416  TempSrc: Temporal  PainSc: 1          Complications: No notable events documented.

## 2020-09-29 NOTE — Anesthesia Preprocedure Evaluation (Signed)
Anesthesia Evaluation  Patient identified by MRN, date of birth, ID band Patient awake    Reviewed: Allergy & Precautions, NPO status , Patient's Chart, lab work & pertinent test results  Airway Mallampati: III  TM Distance: >3 FB Neck ROM: full    Dental no notable dental hx.    Pulmonary neg pulmonary ROS, Current Smoker and Patient abstained from smoking.,    Pulmonary exam normal        Cardiovascular hypertension, negative cardio ROS Normal cardiovascular exam     Neuro/Psych negative neurological ROS  negative psych ROS   GI/Hepatic Neg liver ROS, GERD  ,  Endo/Other  negative endocrine ROS  Renal/GU      Musculoskeletal   Abdominal   Peds  Hematology negative hematology ROS (+)   Anesthesia Other Findings Past Medical History: No date: Arthritis No date: GERD (gastroesophageal reflux disease) No date: History of kidney stones No date: Hyperparathyroidism ([place]) No date: Hypertension  Past Surgical History: No date: APPENDECTOMY No date: BACK SURGERY     Comment:  L4-L5 04/09/2019: BREAST BIOPSY; Left     Comment:  neg 04/08/2019: BREAST CYST ASPIRATION; Left     Comment:  neg No date: COLONOSCOPY W/ POLYPECTOMY No date: FRACTURE SURGERY; Right     Comment:  humerus 2005  BMI    Body Mass Index: 30.90 kg/m      Reproductive/Obstetrics negative OB ROS                             Anesthesia Physical Anesthesia Plan  ASA: 2  Anesthesia Plan: General ETT   Post-op Pain Management:    Induction: Intravenous  PONV Risk Score and Plan: Ondansetron, Dexamethasone, Midazolam and Treatment may vary due to age or medical condition  Airway Management Planned: Oral ETT  Additional Equipment:   Intra-op Plan:   Post-operative Plan: Extubation in OR  Informed Consent: I have reviewed the patients History and Physical, chart, labs and discussed the procedure including  the risks, benefits and alternatives for the proposed anesthesia with the patient or authorized representative who has indicated his/her understanding and acceptance.     Dental Advisory Given  Plan Discussed with: Anesthesiologist, CRNA and Surgeon  Anesthesia Plan Comments: (Patient consented for risks of anesthesia including but not limited to:  - adverse reactions to medications - damage to eyes, teeth, lips or other oral mucosa - nerve damage due to positioning  - sore throat or hoarseness - Damage to heart, brain, nerves, lungs, other parts of body or loss of life  Patient voiced understanding.)        Anesthesia Quick Evaluation

## 2020-09-29 NOTE — Discharge Instructions (Addendum)
DISCHARGE INSTRUCTIONS FOR KIDNEY STONE/URETERAL STENT   MEDICATIONS:  1. Resume all your other meds from home.  2.  AZO (over-the-counter) can help with the burning/stinging when you urinate. 3.  Prescriptions for tamsulosin and oxybutynin for stent irritation and bladder symptoms were sent to your pharmacy  ACTIVITY:  1. May resume regular activities in 24 hours. 2. No driving while on narcotic pain medications  3. Drink plenty of water  4. Continue to walk at home - you can still get blood clots when you are at home, so keep active, but don't over do it.  5. May return to work/school tomorrow or when you feel ready    SIGNS/SYMPTOMS TO CALL:  Common postoperative symptoms include urinary frequency, urgency, bladder spasm and blood in the urine  Please call us if you have a fever greater than 101.5, uncontrolled nausea/vomiting, uncontrolled pain, dizziness, unable to urinate, excessively bloody urine, chest pain, shortness of breath, leg swelling, leg pain, or any other concerns or questions.   You can reach [place] at [phone].   FOLLOW-UP:  1.  Unfortunately due to ureteral anatomy your stone was unable to be treated.  A stent was placed and you will be contacted to schedule a follow-up procedure in approximately 2 weeks  AMBULATORY SURGERY  DISCHARGE INSTRUCTIONS   The drugs that you were given will stay in your system until tomorrow so for the next 24 hours you should not:  Drive an automobile Make any legal decisions Drink any alcoholic beverage   You may resume regular meals tomorrow.  Today it is better to start with liquids and gradually work up to solid foods.  You may eat anything you prefer, but it is better to start with liquids, then soup and crackers, and gradually work up to solid foods.   Please notify your doctor immediately if you have any unusual bleeding, trouble breathing, redness and pain at the surgery site, drainage, fever, or pain not relieved by  medication.    Additional Instructions:        Please contact your physician with any problems or Same Day Surgery at [phone], Monday through Friday 6 am to 4 pm, or [place] at [facility] number at [phone].

## 2020-09-29 NOTE — Interval H&P Note (Signed)
History and Physical Interval Note:  I discussed the procedure in detail with [name] due to potential complications of bleeding, infection and ureteral injury.  The need for a stent placement postoperatively was discussed.  We also discussed the possible need for staged procedure due to her stone burden.  Lastly the possibility that the collecting system cannot be accessed with ureteroscope secondary to ureteral anatomy could occur and in that event a stent would be placed and she would require a follow-up procedure.  CV: RRR Lungs: Clear  09/29/2020 11:47 AM  Michele Sanders  has presented today for surgery, with the diagnosis of Left partial [place]aghorn stone.  The various methods of treatment have been discussed with the patient and family. After consideration of risks, benefits and other options for treatment, the patient has consented to  Procedure(s): CYSTOSCOPY/URETEROSCOPY/HOLMIUM LASER/STENT PLACEMENT (Left) as a surgical intervention.  The patient's history has been reviewed, patient examined, no change in status, stable for surgery.  I have reviewed the patient's chart and labs.  Questions were answered to the patient's satisfaction.     [place]

## 2020-09-29 NOTE — Anesthesia Procedure Notes (Signed)
Procedure Name: Intubation Date/Time: 09/29/2020 12:13 PM Performed by: Jerrye Noble, CRNA Pre-anesthesia Checklist: Patient identified, Emergency Drugs available, Suction available and Patient being monitored Patient Re-evaluated:Patient Re-evaluated prior to induction Oxygen Delivery Method: Circle system utilized Preoxygenation: Pre-oxygenation with 100% oxygen Induction Type: IV induction Ventilation: Mask ventilation without difficulty Laryngoscope Size: McGraph and 3 Grade View: Grade I Tube type: Oral Tube size: 7.0 mm Number of attempts: 1 Airway Equipment and Method: Stylet, Oral airway and Video-laryngoscopy Placement Confirmation: ETT inserted through vocal cords under direct vision, positive ETCO2 and breath sounds checked- equal and bilateral Secured at: 21 cm Tube secured with: Tape Dental Injury: Teeth and Oropharynx as per pre-operative assessment

## 2020-09-29 NOTE — Op Note (Signed)
Preoperative diagnosis:  Left nephrolithiasis  Postoperative diagnosis:  Same  Procedure: Cystoscopy Left ureteroscopy Placement left ureteral stent (20F/22 cm) Left retrograde pyelogram  Surgeon: Abbie Sons, MD  Anesthesia: General  Complications: None  Intraoperative findings:  Cystoscopy: Bladder mucosa normal in appearance without erythema, solid or papillary lesions.  Ureteral orifices normal-appearing with clear efflux Left retrograde pyelogram: Normal caliber ureter without dilation or filling defect.  Left lower pole partial staghorn with inferior pole hydrocalyx  EBL: Minimal  Specimens: None  Indication: Michele Sanders is a 65 y.o. female with a partial left lower pole staghorn calculus with hydrocalyx.  Initially seen earlier this year and elected surveillance however recently has had intermittent left flank pain.  After reviewing the management options for treatment, she elected to proceed with the above surgical procedure(s). We have discussed the potential benefits and risks of the procedure, side effects of the proposed treatment, the likelihood of the patient achieving the goals of the procedure, and any potential problems that might occur during the procedure or recuperation. Informed consent has been obtained.  Description of procedure:  The patient was taken to the operating room and general anesthesia was induced.  The patient was placed in the dorsal lithotomy position, prepped and draped in the usual sterile fashion, and preoperative antibiotics were administered. A preoperative time-out was performed.   A 21 French cystoscope was lubricated and passed per urethra.  Panendoscopy was performed with findings as described above.  Attention was directed to the left ureteral orifice and a 0.038 Sensor wire was placed through the working channel of the cystoscope and into the left UO and advanced proximally under fluoroscopic guidance to an upper pole calyx.  A  dual-lumen catheter was advanced over the wire however would not advance beyond the midportion of the distal ureter.  Retrograde pyelogram was performed through the catheter with findings as described above.  The dual-lumen catheter was removed and the ureter was dilated with over-the-wire dilators from 8-12 [place].  The dual-lumen was then readvanced and easily passed to the region of the proximal ureter.  A second Sensor wire was placed the dual-lumen catheter.  A single channel digital flexible ureteroscope was then advanced over the working wire and easily passed up to the lower proximal ureter.  The guidewire was removed and the scope was advanced however would not advance beyond a narrow portion of the proximal ureter.  A Super Stiff wire was placed through the ureteroscope and through this narrowed area however the ureteroscope would not advance over this wire.  The ureteroscope was removed and the upper portion of the ureter was dilated with the ureteral dilators however a 10 French dilator would not advance.  One final attempt at advancing the flexible ureteroscope through the narrowed area the proximal ureter was unsuccessful and it was elected at this point to place a ureteral stent to allow passive dilation.  A 20FR/22 cm Contour ureteral stent was advanced over the wire.  The proximal tip was within an upper pole calyx and the distal end was well positioned in the bladder under fluoroscopic guidance.  All instruments were removed and after anesthetic reversal the patient was transported to the PACU in stable condition.  Plan: After a 2-3 week period of passive ureteral dilation with an indwelling stent she will be rescheduled for ureteroscopy  Abbie Sons, M.D.

## 2020-09-30 ENCOUNTER — Encounter: Payer: Self-pay | Admitting: Urology

## 2020-09-30 ENCOUNTER — Telehealth: Payer: Self-pay

## 2020-09-30 NOTE — Telephone Encounter (Signed)
Patient called wanting to know an update of what happened during surgery yesterday her husband was unable to explain well. Per Dr. Bernardo Heater :due to anatomy sometimes we are not able to get the scope into the kidney to treat the stone.  She had a narrow ureter and a stent was placed due to being unable to treat the stone. Will contact her soon to follow-up and schedule a ureteroscopy in 2-3 weeks. Patient verbalized understanding

## 2020-10-01 ENCOUNTER — Telehealth: Payer: Self-pay

## 2020-10-01 NOTE — Telephone Encounter (Signed)
She had a stent placed earlier this week and the urine color can vary from clear to dark to red.  If the urine is "tomato juice" in appearance with clots then we definitely need to know.

## 2020-10-01 NOTE — Telephone Encounter (Signed)
Pt LM on triage line stating that she is concerned because her urine is darker today than it was yesterday. She states that she had surgery on Tuesday. Please advise.

## 2020-10-02 NOTE — Telephone Encounter (Signed)
Patient notified and states she does not have bright red blood with clots. I reassured her that the color of her urine will vary in color while the stent is in place. I gave her things to look for that may be alarming. Patient voiced understanding.

## 2020-10-02 NOTE — Telephone Encounter (Signed)
LMOM for patient to return call.

## 2020-10-02 NOTE — Telephone Encounter (Signed)
Pt LM on triage line,states she is returning call.

## 2020-10-09 DIAGNOSIS — M7501 Adhesive capsulitis of right shoulder: Secondary | ICD-10-CM | POA: Diagnosis not present

## 2020-10-09 DIAGNOSIS — W010XXD Fall on same level from slipping, tripping and stumbling without subsequent striking against object, subsequent encounter: Secondary | ICD-10-CM | POA: Diagnosis not present

## 2020-10-09 DIAGNOSIS — M62838 Other muscle spasm: Secondary | ICD-10-CM | POA: Diagnosis not present

## 2020-10-09 DIAGNOSIS — S42291D Other displaced fracture of upper end of right humerus, subsequent encounter for fracture with routine healing: Secondary | ICD-10-CM | POA: Diagnosis not present

## 2020-10-09 DIAGNOSIS — S42251D Displaced fracture of greater tuberosity of right humerus, subsequent encounter for fracture with routine healing: Secondary | ICD-10-CM | POA: Diagnosis not present

## 2020-10-09 DIAGNOSIS — M25511 Pain in right shoulder: Secondary | ICD-10-CM | POA: Diagnosis not present

## 2020-10-12 ENCOUNTER — Telehealth: Payer: Medicare HMO

## 2020-10-12 DIAGNOSIS — N200 Calculus of kidney: Secondary | ICD-10-CM

## 2020-10-12 NOTE — Telephone Encounter (Signed)
Patient called stating that she has been having increased frequency, urgency, hematuria, and dysuria post URS on 09/29/20. An indwelling stent was left during this surgical procedure and patient will be scheduled for another URS in the near future. Patient was given reassurance that her symptoms are to be expected with having an stent in place. She denies fever chills nausea or vomiting. Patient was encouraged to utilize tamsulosin and oxybutinin, also OTC AZO for bladder spasms and dysuria. Patient should call back in a few days if symptoms worsen to consider UA culture or possible change in bladder spasm medication. She is in agreement with this plan.

## 2020-10-13 DIAGNOSIS — M25611 Stiffness of right shoulder, not elsewhere classified: Secondary | ICD-10-CM | POA: Diagnosis not present

## 2020-10-13 DIAGNOSIS — M25511 Pain in right shoulder: Secondary | ICD-10-CM | POA: Diagnosis not present

## 2020-10-13 DIAGNOSIS — M25411 Effusion, right shoulder: Secondary | ICD-10-CM | POA: Diagnosis not present

## 2020-10-20 DIAGNOSIS — M25511 Pain in right shoulder: Secondary | ICD-10-CM | POA: Diagnosis not present

## 2020-10-20 DIAGNOSIS — M25411 Effusion, right shoulder: Secondary | ICD-10-CM | POA: Diagnosis not present

## 2020-10-22 ENCOUNTER — Other Ambulatory Visit: Payer: Medicare HMO

## 2020-10-22 ENCOUNTER — Other Ambulatory Visit: Payer: Self-pay

## 2020-10-22 ENCOUNTER — Other Ambulatory Visit: Payer: Self-pay | Admitting: *Deleted

## 2020-10-22 DIAGNOSIS — N3001 Acute cystitis with hematuria: Secondary | ICD-10-CM | POA: Diagnosis not present

## 2020-10-22 MED ORDER — MIRABEGRON ER 50 MG PO TB24
50.0000 mg | ORAL_TABLET | Freq: Every day | ORAL | 0 refills | Status: DC
Start: 2020-10-22 — End: 2021-02-17

## 2020-10-22 NOTE — Telephone Encounter (Signed)
Would recommend a urine culture.  Can also pick up Myrbetriq samples 50 mg daily in place of the oxybutynin.  I would also refill/continue the tamsulosin.  Judson Roch will be giving her a call for surgery scheduling

## 2020-10-22 NOTE — Telephone Encounter (Signed)
Notified patient as instructed, patient pleased. Discussed follow-up appointments, patient agrees  

## 2020-10-22 NOTE — Telephone Encounter (Signed)
Pt calling back stating that her quality of life has gone done since having the stent, pt states it's been 3 weeks and no one has called her back about the 2nd surgery? Pt states the frequency is worse and things are going downhill. Pt did stop the oxybutynin and flomax because she was out. I advised she has a refill on oxybutynin and that will help with the frequency and urgency. Should pt still be taking flomax? I also spoke about a UA and culture but pt wanted the bladder medication first. Please advise

## 2020-10-23 LAB — URINALYSIS, COMPLETE
Bilirubin, UA: NEGATIVE
Glucose, UA: NEGATIVE
Leukocytes,UA: NEGATIVE
Nitrite, UA: POSITIVE — AB
Specific Gravity, UA: 1.025 (ref 1.005–1.030)
Urobilinogen, Ur: 1 mg/dL (ref 0.2–1.0)
pH, UA: 5 (ref 5.0–7.5)

## 2020-10-23 LAB — MICROSCOPIC EXAMINATION: RBC, Urine: >30 /hpf — AB (ref 0–2)

## 2020-10-25 LAB — CULTURE, URINE COMPREHENSIVE

## 2020-10-25 LAB — SPECIMEN STATUS REPORT

## 2020-10-26 ENCOUNTER — Telehealth: Payer: Self-pay | Admitting: *Deleted

## 2020-10-26 ENCOUNTER — Telehealth: Payer: Self-pay

## 2020-10-26 ENCOUNTER — Other Ambulatory Visit: Payer: Self-pay

## 2020-10-26 DIAGNOSIS — N200 Calculus of kidney: Secondary | ICD-10-CM

## 2020-10-26 MED ORDER — TAMSULOSIN HCL 0.4 MG PO CAPS: 0.4000 mg | ORAL_CAPSULE | Freq: Every day | ORAL | 0 refills | Status: DC

## 2020-10-26 NOTE — Telephone Encounter (Signed)
-----   Message from Nori Riis, PA-C sent at 10/26/2020  9:36 AM EDT ----- Please let Mrs. Alverio know her urine culture was negative for infection.

## 2020-10-26 NOTE — Telephone Encounter (Signed)
Incoming call on triage line from patient in regards to upcoming surgery. Patient states she is having severe urgency and Myrbetriq is not working after 5 days of taking it.  Patient is seeking an update on upcoming surgery. Encouraged patient to continue taking Myrbetriq and assured her surgery scheduler would be in touch this week. Patient would like a call back.

## 2020-10-26 NOTE — Telephone Encounter (Signed)
Spoke with patient she states that the Myrbetriq samples are not helping with stent discomfort. Tamsulosin was refilled to pharmacy. Patient was instructed to take in addition to Myrbetriq to relieve spasms discomfort. Patient verbalized understanding.  Patient agreed on next available surgery date of 11-10-20 for Left URS LL stone removal with stent exchange with Dr. Bernardo Heater. Patient to do pre-op urine culture on 10/29/20, orders placed.  Surgical information given in detail (NPO, arrival time number, etc) Patient requests a pre-op virtual visit to discuss surgery w/Dr. Bernardo Heater. Apt made  Hold anticoags per Dr. Bernardo Heater, patient on ASAS '81mg'$  daily. Left patient a message to call the office in regards to ASA, if preventative or directed by a doctor

## 2020-10-26 NOTE — Telephone Encounter (Signed)
Notified patient as instructed, left voice mail per [facility]

## 2020-10-26 NOTE — Addendum Note (Signed)
Addended by: Tommy Rainwater on: 10/26/2020 10:50 AM   Modules accepted: Orders

## 2020-10-27 DIAGNOSIS — M25511 Pain in right shoulder: Secondary | ICD-10-CM | POA: Diagnosis not present

## 2020-10-27 DIAGNOSIS — M25411 Effusion, right shoulder: Secondary | ICD-10-CM | POA: Diagnosis not present

## 2020-10-29 ENCOUNTER — Other Ambulatory Visit: Payer: Medicare HMO

## 2020-10-29 ENCOUNTER — Other Ambulatory Visit: Payer: Self-pay

## 2020-10-29 DIAGNOSIS — N200 Calculus of kidney: Secondary | ICD-10-CM

## 2020-10-30 ENCOUNTER — Telehealth: Payer: Self-pay | Admitting: *Deleted

## 2020-10-30 LAB — URINALYSIS, COMPLETE
Bilirubin, UA: NEGATIVE
Glucose, UA: NEGATIVE
Ketones, UA: NEGATIVE
Nitrite, UA: NEGATIVE
Specific Gravity, UA: 1.025 (ref 1.005–1.030)
Urobilinogen, Ur: 0.2 mg/dL (ref 0.2–1.0)
pH, UA: 5.5 (ref 5.0–7.5)

## 2020-10-30 LAB — MICROSCOPIC EXAMINATION: RBC, Urine: >30 /hpf — AB (ref 0–2)

## 2020-10-30 NOTE — Telephone Encounter (Signed)
Patient called regarding upcoming virtual appointment scheduled for 11/04/20 to discuss pre-op surgery, would like daughter Michaelle Copas [phone] to be on call as well, [place]. Please advise.

## 2020-11-01 ENCOUNTER — Encounter: Payer: Self-pay | Admitting: Urology

## 2020-11-01 LAB — CULTURE, URINE COMPREHENSIVE

## 2020-11-03 ENCOUNTER — Encounter: Payer: Self-pay | Admitting: Urology

## 2020-11-03 DIAGNOSIS — M25411 Effusion, right shoulder: Secondary | ICD-10-CM | POA: Diagnosis not present

## 2020-11-03 DIAGNOSIS — M25511 Pain in right shoulder: Secondary | ICD-10-CM | POA: Diagnosis not present

## 2020-11-04 ENCOUNTER — Encounter
Admission: RE | Admit: 2020-11-04 | Discharge: 2020-11-04 | Disposition: A | Discharge status: Home / Self Care | Payer: Medicare HMO | Source: Ambulatory Visit | Attending: Urology | Admitting: Urology

## 2020-11-04 ENCOUNTER — Other Ambulatory Visit: Payer: Self-pay

## 2020-11-04 ENCOUNTER — Telehealth (INDEPENDENT_AMBULATORY_CARE_PROVIDER_SITE_OTHER): Payer: Medicare HMO | Admitting: Urology

## 2020-11-04 DIAGNOSIS — N200 Calculus of kidney: Secondary | ICD-10-CM

## 2020-11-04 NOTE — Patient Instructions (Addendum)
Your procedure is scheduled on:11-10-20 Tuesday Report to the Registration Desk on the 1st floor of the [facility].Then proceed to the 2nd floor Surgery Desk in the [facility] To find out your arrival time, please call [phone] between 1PM - 3PM on:11-06-20 Friday  REMEMBER: Instructions that are not followed completely may result in serious medical risk, up to and including death; or upon the discretion of your surgeon and anesthesiologist your surgery may need to be rescheduled.  Do not eat food OR drink any liquids after midnight the night before surgery.  No gum chewing, lozengers or hard candies.  TAKE THESE MEDICATIONS THE MORNING OF SURGERY WITH A SIP OF WATER: -Mirabegron ER (Myrbetriq) -Tamsulosin (Flomax) -Protonix (Pantoprazole)-take one the night before and one on the morning of surgery - helps to prevent nausea after surgery.)  81 mg Aspirin was stopped last week (week of 10-26-20)  One week prior to surgery: Stop Anti-inflammatories (NSAIDS) such as Advil, Aleve, Ibuprofen, Motrin, Naproxen, Naprosyn and Aspirin based products such as Excedrin, Goodys Powder, BC Powder.You may however, continue to take Tylenol if needed for pain up until the day of surgery.  Stop ANY OVER THE COUNTER supplements/vitamins NOW (11-04-20) until after surgery (Zinc, CBD oil, Omega 3, Melatonin, Mag-Ox, Vitamin D3)  No Alcohol for 24 hours before or after surgery.  No Smoking including e-cigarettes for 24 hours prior to surgery.  No chewable tobacco products for at least 6 hours prior to surgery.  No nicotine patches on the day of surgery.  Do not use any "recreational" drugs for at least a week prior to your surgery.  Please be advised that the combination of cocaine and anesthesia may have negative outcomes, up to and including death. If you test positive for cocaine, your surgery will be cancelled.  On the morning of surgery brush your teeth with toothpaste and water, you may rinse  your mouth with mouthwash if you wish. Do not swallow any toothpaste or mouthwash.  Do not wear jewelry, make-up, hairpins, clips or nail polish.  Do not wear lotions, powders, or perfumes.   Do not shave body from the neck down 48 hours prior to surgery just in case you cut yourself which could leave a site for infection.  Also, freshly shaved skin may become irritated if using the CHG soap.  Contact lenses, hearing aids and dentures may not be worn into surgery.  Do not bring valuables to the hospital. [facility] is not responsible for any missing/lost belongings or valuables.   Notify your doctor if there is any change in your medical condition (cold, fever, infection).  Wear comfortable clothing (specific to your surgery type) to the hospital.  After surgery, you can help prevent lung complications by doing breathing exercises.  Take deep breaths and cough every 1-2 hours. Your doctor may order a device called an Incentive Spirometer to help you take deep breaths. When coughing or sneezing, hold a pillow firmly against your incision with both hands. This is called "splinting." Doing this helps protect your incision. It also decreases belly discomfort.  If you are being admitted to the hospital overnight, leave your suitcase in the car. After surgery it may be brought to your room.  If you are being discharged the day of surgery, you will not be allowed to drive home. You will need a responsible adult (18 years or older) to drive you home and stay with you that night.   If you are taking public transportation, you will  need to have a responsible adult (18 years or older) with you. Please confirm with your physician that it is acceptable to use public transportation.   Please call the [facility] at [phone] if you have any questions about these instructions.  Surgery Visitation Policy:  Patients undergoing a surgery or procedure may have one family member  or support person with them as long as that person is not COVID-19 positive or experiencing its symptoms.  That person may remain in the waiting area during the procedure.  Inpatient Visitation:    Visiting hours are 7 a.m. to 8 p.m. Inpatients will be allowed two visitors daily. The visitors may change each day during the patient's stay. No visitors under the age of 34. Any visitor under the age of 47 must be accompanied by an adult. The visitor must pass COVID-19 screenings, use hand sanitizer when entering and exiting the patient's room and wear a mask at all times, including in the patient's room. Patients must also wear a mask when staff or their visitor are in the room. Masking is required regardless of vaccination status.

## 2020-11-04 NOTE — Progress Notes (Signed)
Virtual Visit via Telephone Note  I connected with Michele Sanders on 11/04/20 at 11:45 AM EDT by telephone and verified that I am speaking with the correct person using two identifiers.  Location: Patient: Home Provider: [facility] Participants: Patient, provider and her daughter   I discussed the limitations, risks, security and privacy concerns of performing an evaluation and management service by telephone and the availability of in person appointments. I also discussed with the patient that there may be a patient responsible charge related to this service. The patient expressed understanding and agreed to proceed.   History of Present Illness: 65 y.o. female with a partial left lower pole staghorn calculus with hydrocalyx.  She underwent left ureteroscopy 09/29/2020 however due to narrowing of the left proximal ureter the collecting system cannot be accessed and stone was not treated.  A ureteral stent was placed for passive dilation.  She is scheduled for follow-up ureteroscopy next week.  She has had significant stent symptoms which has impacted her quality of life for the last month.  Symptoms have included dysuria, pelvic pain, frequency, urgency and significant incontinence.  No improvement with tamsulosin/Myrbetriq/anticholinergic medication and Pyridium   Observations/Objective: Alert, answers questions appropriately  Assessment and Plan: Left lower pole partial staghorn calculus with hydrocalyx For follow-up ureteroscopy next week.  She inquired the neck step if the collecting system could not be accessed.  We discussed this would be unlikely after several weeks of stent dilation however if it did occur then she would need PCNL which would have to be performed in another setting We discussed the need for replacement of her stent postoperatively and it would be unlikely for it needing to remain longer than 7 days.  Potential risks were again reviewed including bleeding,  infection, ureteral injury and inability to render completely stone free. She was given Uribel samples to see if this will help her dysuria  Follow Up Instructions: As above   I discussed the assessment and treatment plan with the patient. The patient was provided an opportunity to ask questions and all were answered. The patient agreed with the plan and demonstrated an understanding of the instructions.   The patient was advised to call back or seek an in-person evaluation if the symptoms worsen or if the condition fails to improve as anticipated.  I provided 23 minutes of non-face-to-face time during this encounter.   Abbie Sons, MD

## 2020-11-05 NOTE — Telephone Encounter (Signed)
Telephone visit done 11/04/2020

## 2020-11-10 ENCOUNTER — Ambulatory Visit
Admission: RE | Admit: 2020-11-10 | Discharge: 2020-11-10 | Disposition: A | Discharge status: Home / Self Care | Payer: Medicare HMO | Attending: Urology | Admitting: Urology

## 2020-11-10 ENCOUNTER — Encounter: Payer: Self-pay | Admitting: Urology

## 2020-11-10 ENCOUNTER — Encounter
Admission: RE | Disposition: A | Discharge status: Home / Self Care | Payer: Self-pay | Source: Home / Self Care | Attending: Urology

## 2020-11-10 ENCOUNTER — Ambulatory Visit: Payer: Medicare HMO

## 2020-11-10 ENCOUNTER — Other Ambulatory Visit: Payer: Self-pay

## 2020-11-10 DIAGNOSIS — N200 Calculus of kidney: Secondary | ICD-10-CM | POA: Diagnosis not present

## 2020-11-10 DIAGNOSIS — Z7982 Long term (current) use of aspirin: Secondary | ICD-10-CM | POA: Insufficient documentation

## 2020-11-10 DIAGNOSIS — N201 Calculus of ureter: Secondary | ICD-10-CM | POA: Diagnosis not present

## 2020-11-10 DIAGNOSIS — F17210 Nicotine dependence, cigarettes, uncomplicated: Secondary | ICD-10-CM | POA: Diagnosis not present

## 2020-11-10 DIAGNOSIS — Z87442 Personal history of urinary calculi: Secondary | ICD-10-CM | POA: Diagnosis not present

## 2020-11-10 DIAGNOSIS — Z79899 Other long term (current) drug therapy: Secondary | ICD-10-CM | POA: Insufficient documentation

## 2020-11-10 HISTORY — PX: CYSTOSCOPY/URETEROSCOPY/HOLMIUM LASER/STENT PLACEMENT: SHX6546

## 2020-11-10 SURGERY — CYSTOSCOPY/URETEROSCOPY/HOLMIUM LASER/STENT PLACEMENT
Anesthesia: General | Site: Urethra | Laterality: Left

## 2020-11-10 MED ORDER — ORAL CARE MOUTH RINSE
15.0000 mL | Freq: Once | OROMUCOSAL | Status: AC
Start: 2020-11-10 — End: 2020-11-10

## 2020-11-10 MED ORDER — LIDOCAINE HCL (CARDIAC) PF 100 MG/5ML IV SOSY
PREFILLED_SYRINGE | INTRAVENOUS | Status: DC | PRN
  Administered 2020-11-10: 80 mg via INTRAVENOUS

## 2020-11-10 MED ORDER — PROPOFOL 10 MG/ML IV BOLUS
INTRAVENOUS | Status: AC
  Filled 2020-11-10: qty 20

## 2020-11-10 MED ORDER — ACETAMINOPHEN 10 MG/ML IV SOLN
INTRAVENOUS | Status: DC | PRN
  Administered 2020-11-10: 1000 mg via INTRAVENOUS

## 2020-11-10 MED ORDER — CHLORHEXIDINE GLUCONATE 0.12 % MT SOLN
OROMUCOSAL | Status: AC
  Administered 2020-11-10: 15 mL via OROMUCOSAL
  Filled 2020-11-10: qty 15

## 2020-11-10 MED ORDER — SUGAMMADEX SODIUM 500 MG/5ML IV SOLN
INTRAVENOUS | Status: AC
  Filled 2020-11-10: qty 5

## 2020-11-10 MED ORDER — LIDOCAINE HCL (PF) 2 % IJ SOLN
INTRAMUSCULAR | Status: AC
  Filled 2020-11-10: qty 5

## 2020-11-10 MED ORDER — HYDROCODONE-ACETAMINOPHEN 5-325 MG PO TABS
1.0000 | ORAL_TABLET | Freq: Four times a day (QID) | ORAL | 0 refills | Status: DC | PRN
Start: 2020-11-10 — End: 2021-02-17

## 2020-11-10 MED ORDER — PROPOFOL 10 MG/ML IV BOLUS
INTRAVENOUS | Status: DC | PRN
  Administered 2020-11-10: 160 mg via INTRAVENOUS
  Administered 2020-11-10: 40 mg via INTRAVENOUS

## 2020-11-10 MED ORDER — DEXAMETHASONE SODIUM PHOSPHATE 10 MG/ML IJ SOLN
INTRAMUSCULAR | Status: AC
  Filled 2020-11-10: qty 1

## 2020-11-10 MED ORDER — HYDROCODONE-ACETAMINOPHEN 5-325 MG PO TABS
ORAL_TABLET | ORAL | Status: AC
  Filled 2020-11-10: qty 1

## 2020-11-10 MED ORDER — ONDANSETRON HCL 4 MG/2ML IJ SOLN
INTRAMUSCULAR | Status: AC
  Filled 2020-11-10: qty 2

## 2020-11-10 MED ORDER — HYDROCODONE-ACETAMINOPHEN 5-325 MG PO TABS
1.0000 | ORAL_TABLET | Freq: Four times a day (QID) | ORAL | Status: DC | PRN
Start: 2020-11-10 — End: 2020-11-10
  Administered 2020-11-10: 1 via ORAL

## 2020-11-10 MED ORDER — GENTAMICIN SULFATE 40 MG/ML IJ SOLN
5.0000 mg/kg | INTRAVENOUS | Status: AC
  Administered 2020-11-10: 410 mg via INTRAVENOUS
  Filled 2020-11-10 (×2): qty 10.25

## 2020-11-10 MED ORDER — ROCURONIUM BROMIDE 10 MG/ML (PF) SYRINGE
PREFILLED_SYRINGE | INTRAVENOUS | Status: AC
  Filled 2020-11-10: qty 10

## 2020-11-10 MED ORDER — ACETAMINOPHEN 10 MG/ML IV SOLN
INTRAVENOUS | Status: AC
  Filled 2020-11-10: qty 100

## 2020-11-10 MED ORDER — FENTANYL CITRATE (PF) 100 MCG/2ML IJ SOLN
INTRAMUSCULAR | Status: DC | PRN
  Administered 2020-11-10 (×2): 50 ug via INTRAVENOUS

## 2020-11-10 MED ORDER — LACTATED RINGERS IV SOLN: INTRAVENOUS | Status: DC

## 2020-11-10 MED ORDER — SUGAMMADEX SODIUM 200 MG/2ML IV SOLN
INTRAVENOUS | Status: DC | PRN
  Administered 2020-11-10: 320 mg via INTRAVENOUS

## 2020-11-10 MED ORDER — IOPAMIDOL (ISOVUE-200) INJECTION 41%
INTRAVENOUS | Status: DC | PRN
  Administered 2020-11-10: 10 mL

## 2020-11-10 MED ORDER — 0.9 % SODIUM CHLORIDE (POUR BTL) OPTIME
TOPICAL | Status: DC | PRN
  Administered 2020-11-10: 1000 mL

## 2020-11-10 MED ORDER — ROCURONIUM BROMIDE 100 MG/10ML IV SOLN
INTRAVENOUS | Status: DC | PRN
  Administered 2020-11-10: 50 mg via INTRAVENOUS
  Administered 2020-11-10: 20 mg via INTRAVENOUS

## 2020-11-10 MED ORDER — CHLORHEXIDINE GLUCONATE 0.12 % MT SOLN: 15.0000 mL | Freq: Once | OROMUCOSAL | Status: AC

## 2020-11-10 MED ORDER — ONDANSETRON HCL 4 MG/2ML IJ SOLN
INTRAMUSCULAR | Status: DC | PRN
  Administered 2020-11-10: 4 mg via INTRAVENOUS

## 2020-11-10 MED ORDER — FENTANYL CITRATE (PF) 100 MCG/2ML IJ SOLN
INTRAMUSCULAR | Status: AC
  Filled 2020-11-10: qty 2

## 2020-11-10 MED ORDER — DEXAMETHASONE SODIUM PHOSPHATE 10 MG/ML IJ SOLN
INTRAMUSCULAR | Status: DC | PRN
  Administered 2020-11-10: 5 mg via INTRAVENOUS

## 2020-11-10 SURGICAL SUPPLY — 32 items
BAG DRAIN CYSTO-URO LG1000N (MISCELLANEOUS) ×2 IMPLANT
BASKET ZERO TIP 1.9FR (BASKET) IMPLANT
BRUSH SCRUB EZ 1% IODOPHOR (MISCELLANEOUS) ×2 IMPLANT
BSKT STON RTRVL ZERO TP 1.9FR (BASKET)
CATH URET FLEX-TIP 2 LUMEN 10F (CATHETERS) ×2 IMPLANT
CATH URETL OPEN 5X70 (CATHETERS) IMPLANT
CNTNR SPEC 2.5X3XGRAD LEK (MISCELLANEOUS)
CONT SPEC 4OZ STER OR WHT (MISCELLANEOUS)
CONT SPEC 4OZ STRL OR WHT (MISCELLANEOUS)
CONTAINER SPEC 2.5X3XGRAD LEK (MISCELLANEOUS) IMPLANT
DRAPE UTILITY 15X26 TOWEL STRL (DRAPES) ×2 IMPLANT
GAUZE 4X4 16PLY ~~LOC~~+RFID DBL (SPONGE) ×4 IMPLANT
GLOVE SURG UNDER POLY LF SZ7.5 (GLOVE) ×2 IMPLANT
GOWN STRL REUS W/ TWL LRG LVL3 (GOWN DISPOSABLE) ×1 IMPLANT
GOWN STRL REUS W/ TWL XL LVL3 (GOWN DISPOSABLE) ×1 IMPLANT
GOWN STRL REUS W/TWL LRG LVL3 (GOWN DISPOSABLE) ×2
GOWN STRL REUS W/TWL XL LVL3 (GOWN DISPOSABLE) ×2
GUIDEWIRE STR DUAL SENSOR (WIRE) ×2 IMPLANT
INFUSOR MANOMETER BAG 3000ML (MISCELLANEOUS) ×2 IMPLANT
IV NS IRRIG 3000ML ARTHROMATIC (IV SOLUTION) ×2 IMPLANT
KIT TURNOVER CYSTO (KITS) ×2 IMPLANT
PACK CYSTO AR (MISCELLANEOUS) ×2 IMPLANT
SET CYSTO W/LG BORE CLAMP LF (SET/KITS/TRAYS/PACK) ×2 IMPLANT
SHEATH URETERAL 12FRX35CM (MISCELLANEOUS) ×2 IMPLANT
STENT URET 6FRX22 CONTOUR (STENTS) ×2 IMPLANT
STENT URET 6FRX24 CONTOUR (STENTS) IMPLANT
STENT URET 6FRX26 CONTOUR (STENTS) IMPLANT
SURGILUBE 2OZ TUBE FLIPTOP (MISCELLANEOUS) ×2 IMPLANT
TRACTIP FLEXIVA PULSE ID 200 (Laser) ×2 IMPLANT
VALVE UROSEAL ADJ ENDO (VALVE) ×2 IMPLANT
WATER STERILE IRR 1000ML POUR (IV SOLUTION) ×2 IMPLANT
WATER STERILE IRR 500ML POUR (IV SOLUTION) ×2 IMPLANT

## 2020-11-10 NOTE — Anesthesia Procedure Notes (Signed)
Procedure Name: Intubation Date/Time: 11/10/2020 2:39 PM Performed by: Aline Brochure, CRNA Pre-anesthesia Checklist: Patient identified, Patient being monitored, Timeout performed, Emergency Drugs available and Suction available Patient Re-evaluated:Patient Re-evaluated prior to induction Oxygen Delivery Method: Circle system utilized Preoxygenation: Pre-oxygenation with 100% oxygen Induction Type: IV induction Ventilation: Mask ventilation without difficulty Laryngoscope Size: 3 and McGraph Grade View: Grade I Tube type: Oral Tube size: 7.0 mm Number of attempts: 1 Airway Equipment and Method: Stylet Placement Confirmation: ETT inserted through vocal cords under direct vision, positive ETCO2 and breath sounds checked- equal and bilateral Secured at: 21 cm Tube secured with: Tape Dental Injury: Teeth and Oropharynx as per pre-operative assessment

## 2020-11-10 NOTE — Anesthesia Preprocedure Evaluation (Signed)
Anesthesia Evaluation  Patient identified by MRN, date of birth, ID band Patient awake    Reviewed: Allergy & Precautions, NPO status , Patient's Chart, lab work & pertinent test results  Airway Mallampati: III  TM Distance: >3 FB Neck ROM: full    Dental no notable dental hx.    Pulmonary neg pulmonary ROS, Current Smoker and Patient abstained from smoking.,    Pulmonary exam normal        Cardiovascular hypertension, Pt. on medications negative cardio ROS Normal cardiovascular exam     Neuro/Psych negative neurological ROS  negative psych ROS   GI/Hepatic Neg liver ROS, GERD  Medicated,  Endo/Other  negative endocrine ROS  Renal/GU      Musculoskeletal   Abdominal   Peds  Hematology negative hematology ROS (+)   Anesthesia Other Findings Arthritis    GERD (gastroesophageal reflux disease)  History of kidney stones    Hyperparathyroidism ([place])    Hypertension       Reproductive/Obstetrics negative OB ROS                            Anesthesia Physical  Anesthesia Plan  ASA: 2  Anesthesia Plan: General ETT   Post-op Pain Management:    Induction: Intravenous  PONV Risk Score and Plan: Ondansetron, Dexamethasone, Midazolam and Treatment may vary due to age or medical condition  Airway Management Planned: Oral ETT  Additional Equipment:   Intra-op Plan:   Post-operative Plan: Extubation in OR  Informed Consent: I have reviewed the patients History and Physical, chart, labs and discussed the procedure including the risks, benefits and alternatives for the proposed anesthesia with the patient or authorized representative who has indicated his/her understanding and acceptance.     Dental Advisory Given  Plan Discussed with: Anesthesiologist, CRNA and Surgeon  Anesthesia Plan Comments: (Patient consented for risks of anesthesia including but not limited to:  - adverse  reactions to medications - damage to eyes, teeth, lips or other oral mucosa - nerve damage due to positioning  - sore throat or hoarseness - Damage to heart, brain, nerves, lungs, other parts of body or loss of life  Patient voiced understanding.)        Anesthesia Quick Evaluation

## 2020-11-10 NOTE — Anesthesia Postprocedure Evaluation (Signed)
Anesthesia Post Note  Patient: Michele Sanders  Procedure(s) Performed: CYSTOSCOPY/URETEROSCOPY/HOLMIUM LASER/STENT PLACEMENT (Left: Urethra)  Patient location during evaluation: PACU Anesthesia Type: General Level of consciousness: awake and alert Pain management: pain level controlled Vital Signs Assessment: post-procedure vital signs reviewed and stable Respiratory status: spontaneous breathing, nonlabored ventilation, respiratory function stable and patient connected to nasal cannula oxygen Cardiovascular status: blood pressure returned to baseline and stable Postop Assessment: no apparent nausea or vomiting Anesthetic complications: no   No notable events documented.   Last Vitals:  Vitals:   11/10/20 1700 11/10/20 1710  BP: (!) 151/83 (!) 151/82  Pulse: 93 84  Resp: 18 14  Temp:  36.6 C  SpO2: 95% 93%    Last Pain:  Vitals:   11/10/20 1710  TempSrc: Temporal  PainSc: 6                  Martha Clan

## 2020-11-10 NOTE — Discharge Instructions (Addendum)
DISCHARGE INSTRUCTIONS FOR KIDNEY STONE/URETERAL STENT   MEDICATIONS:  1. Resume all your other meds from home.  2.  Hydrocodone is for moderate/severe pain, Rx was sent to your pharmacy.  ACTIVITY:  1. May resume regular activities in 24 hours. 2. No driving while on narcotic pain medications  3. Drink plenty of water  4. Continue to walk at home - you can still get blood clots when you are at home, so keep active, but don't over do it.  5. May return to work/school tomorrow or when you feel ready    SIGNS/SYMPTOMS TO CALL:  Common postoperative symptoms include urinary frequency, urgency, bladder spasm and blood in the urine  Please call us if you have a fever greater than 101.5, uncontrolled nausea/vomiting, uncontrolled pain, dizziness, unable to urinate, excessively bloody urine, chest pain, shortness of breath, leg swelling, leg pain, or any other concerns or questions.   You can reach [place] at [phone].   FOLLOW-UP:  1.  Appointment scheduled 11/18/2020 for cystoscopy with stent removal AMBULATORY SURGERY  DISCHARGE INSTRUCTIONS   The drugs that you were given will stay in your system until tomorrow so for the next 24 hours you should not:  Drive an automobile Make any legal decisions Drink any alcoholic beverage   You may resume regular meals tomorrow.  Today it is better to start with liquids and gradually work up to solid foods.  You may eat anything you prefer, but it is better to start with liquids, then soup and crackers, and gradually work up to solid foods.   Please notify your doctor immediately if you have any unusual bleeding, trouble breathing, redness and pain at the surgery site, drainage, fever, or pain not relieved by medication.    Additional Instructions:   Please contact your physician with any problems or Same Day Surgery at [phone], Monday through Friday 6 am to 4 pm, or [place] [facility] number at [phone].

## 2020-11-10 NOTE — Interval H&P Note (Signed)
History and Physical Interval Note:  11/10/2020 11:57 AM  Michele Sanders  has presented today for surgery, with the diagnosis of Left Nephrolithiasis.  The various methods of treatment have been discussed with the patient and family. After consideration of risks, benefits and other options for treatment, the patient has consented to  Procedure(s): CYSTOSCOPY/URETEROSCOPY/HOLMIUM LASER/STENT PLACEMENT (Left) as a surgical intervention.  The patient's history has been reviewed, patient examined, no change in status, stable for surgery.  I have reviewed the patient's chart and labs.  Questions were answered to the patient's satisfaction.     [place]

## 2020-11-10 NOTE — Transfer of Care (Signed)
Immediate Anesthesia Transfer of Care Note  Patient: Michele Sanders  Procedure(s) Performed: CYSTOSCOPY/URETEROSCOPY/HOLMIUM LASER/STENT PLACEMENT (Left: Urethra)  Patient Location: PACU  Anesthesia Type:General  Level of Consciousness: awake  Airway & Oxygen Therapy: Patient Spontanous Breathing and Patient connected to face mask oxygen  Post-op Assessment: Report given to RN and Post -op Vital signs reviewed and stable  Post vital signs: Reviewed and stable  Last Vitals:  Vitals Value Taken Time  BP 157/66 11/10/20 1615  Temp    Pulse 91 11/10/20 1620  Resp 18 11/10/20 1620  SpO2 97 % 11/10/20 1620  Vitals shown include unvalidated device data.  Last Pain:  Vitals:   11/10/20 0902  TempSrc: Temporal  PainSc: 0-No pain         Complications: No notable events documented.

## 2020-11-10 NOTE — H&P (Signed)
Urology H&P   History of Present Illness: Michele Sanders is a 65 y.o. female with a left lower pole partial staghorn calculus with hydrocalyx.  She initially underwent attempted ureteroscopic removal late July 2022 however the collecting system could not be accessed due to narrowing of the proximal ureter.  A ureteral stent was placed and she presents for follow-up ureteroscopy for definitive stone treatment.  Past Medical History:  Diagnosis Date   Arthritis    GERD (gastroesophageal reflux disease)    History of kidney stones    Hyperparathyroidism ([place])    Hypertension     Past Surgical History:  Procedure Laterality Date   APPENDECTOMY     BACK SURGERY     L4-L5   BREAST BIOPSY Left 04/09/2019   neg   BREAST CYST ASPIRATION Left 04/08/2019   neg   COLONOSCOPY W/ POLYPECTOMY     CYSTOSCOPY/URETEROSCOPY/HOLMIUM LASER/STENT PLACEMENT Left 09/29/2020   Procedure: CYSTOSCOPY/URETEROSCOPY, /STENT PLACEMENT;  Surgeon: Abbie Sons, MD;  Location: [facility] ORS;  Service: Urology;  Laterality: Left;   FRACTURE SURGERY Right    humerus 2005    Home Medications:  Current Meds  Medication Sig   acetaminophen (TYLENOL) 500 MG tablet Take 1,000 mg by mouth every 6 (six) hours as needed for moderate pain or mild pain.   aspirin 81 MG chewable tablet Chew 81 mg by mouth daily.   Baclofen 5 MG TABS Take 5 mg by mouth 2 (two) times daily as needed for muscle pain.   Cholecalciferol (VITAMIN D3) 50 MCG (2000 UT) TABS Take 2,000 Units by mouth daily.   lisinopril (PRINIVIL,ZESTRIL) 10 MG tablet Take 10 mg by mouth every morning.   magnesium oxide (MAG-OX) 400 MG tablet Take 400 mg by mouth daily.   Melatonin 10 MG TBCR Take 10 mg by mouth at bedtime.   mirabegron ER (MYRBETRIQ) 50 MG TB24 tablet Take 1 tablet (50 mg total) by mouth daily. (Patient taking differently: Take 50 mg by mouth every morning.)   Omega-3 1000 MG CAPS Take 1,000 mg by mouth daily.   OVER THE COUNTER MEDICATION Take  10 mg by mouth daily. CBD oil   pantoprazole (PROTONIX) 40 MG tablet Take 1 tablet (40 mg total) by mouth daily. (Patient taking differently: Take 40 mg by mouth every morning.)   simvastatin (ZOCOR) 40 MG tablet Take 40 mg by mouth daily at 6 PM.   tamsulosin (FLOMAX) 0.4 MG CAPS capsule Take 1 capsule (0.4 mg total) by mouth daily after breakfast.   zinc gluconate 50 MG tablet Take 50 mg by mouth daily.    Allergies: No Known Allergies  Family History  Problem Relation Age of Onset   Breast cancer Neg Hx     Social History:  reports that she has been smoking cigarettes. She has a 15.00 pack-year smoking history. She has never used smokeless tobacco. She reports current alcohol use. She reports that she does not use drugs.  ROS: A complete review of systems was performed.  All systems are negative except for pertinent findings as noted.  Physical Exam:  Vital signs in last 24 hours:   Constitutional:  Alert and oriented, No acute distress HEENT: [place] AT, moist mucus membranes.  Trachea midline, no masses Cardiovascular: Regular rate and rhythm,. Respiratory: Normal respiratory effort, lungs clear bilaterally Neurologic: Grossly intact, no focal deficits, moving all 4 extremities Psychiatric: Normal mood and affect   Laboratory Data:  No results for input(s): WBC, HGB, HCT in the last 72  hours. No results for input(s): NA, K, CL, CO2, GLUCOSE, BUN, CREATININE, CALCIUM in the last 72 hours. No results for input(s): LABPT, INR in the last 72 hours. No results for input(s): LABURIN in the last 72 hours. Results for orders placed or performed in visit on 10/29/20  CULTURE, URINE COMPREHENSIVE     Status: None   Collection Time: 10/29/20 10:09 AM   Specimen: Urine   UR  Result Value Ref Range Status   Urine Culture, Comprehensive Final report  Final   Organism ID, Bacteria Comment  Final    Comment: Mixed urogenital flora 10,000-25,000 colony forming units per mL   Microscopic  Examination     Status: Abnormal   Collection Time: 10/29/20 10:09 AM   Urine  Result Value Ref Range Status   WBC, UA 11-30 (A) 0 - 5 /hpf Final   RBC >30 (A) 0 - 2 /hpf Final   Epithelial Cells (non renal) 0-10 0 - 10 /hpf Final   Renal Epithel, UA 0-10 (A) None seen /hpf Final   Bacteria, UA Few None seen/Few Final     Impression/Plan:  65 y.o. female with a left lower pole staghorn calculus who presents for follow-up ureteroscopy for definitive stone treatment The procedure has been discussed detail including potential risks of bleeding, infection, ureteral/kidney injury.  Since she has been stented it is unlikely that the stone could not be treated but if this were to occur she would need to be scheduled for PCNL at a later date.   11/10/2020, 7:30 AM  John Giovanni,  MD

## 2020-11-10 NOTE — Op Note (Signed)
Preoperative diagnosis: Left nephrolithiasis   Postoperative diagnosis: Left nephrolithiasis  Procedure:  Cystoscopy Left ureteroscopy and stone removal (partial) Ureteroscopic laser lithotripsy Left ureteral stent exchange (47F/22 cm)  Left retrograde pyelography with interpretation  Surgeon: Nicki Reaper C. Akaysha Cobern, M.D.  Anesthesia: General  Complications: None  Intraoperative findings:  Cystoscopy: Inflammatory changes left hemitrigone secondary to indwelling stent.  No bladder mucosal lesions Ureteropyeloscopy: Ureter normal in appearance and no problems with the collecting system access.  Left lower pole calculus was within extreme left lower pole infundibulum which was partially protruding into the renal pelvis.  This portion of the stone was treated however with maximal scope flexion the laser fiber was superior to the remaining calculus.  EBL: Minimal  Specimens: None   Indication:   Michele Sanders is a 65 y.o. female with a history of intermittent severe left flank pain and a left lower pole partial staghorn calculus with hydrocalyx.  She initially underwent attempted ureteroscopic removal late July 2022 however the collecting system could not be accessed due to narrowing of the proximal ureter.  A ureteral stent was placed and she presents for follow-up ureteroscopy for definitive stone treatment.After reviewing the management options for treatment, the patient elected to proceed with the above surgical procedure(s). We have discussed the potential benefits and risks of the procedure, side effects of the proposed treatment, the likelihood of the patient achieving the goals of the procedure, and any potential problems that might occur during the procedure or recuperation. Informed consent has been obtained.  Description of procedure:  The patient was taken to the operating room and general anesthesia was induced.  The patient was placed in the dorsal lithotomy position, prepped and  draped in the usual sterile fashion, and preoperative antibiotics were administered. A preoperative time-out was performed.   A 21 French cystoscope was lubricated and passed per urethra.  Panendoscopy was then performed with findings as described above.  The left ureteral stent was grasped with endoscopic forceps and brought out through the urethral meatus.  A 0.038 Sensor wire was then placed to the stent and advanced to the renal pelvis under fluoroscopic guidance.    A dual channel digital flexible ureteroscope was then passed per urethra and was unable to be inserted alongside the guidewire.  A second guidewire was placed through the flexible ureteroscope and into the left ureter however the ureteroscope was unable to be advanced.  A 12/14 French ureteral access sheath was then placed over the working wire under fluoroscopic guidance without difficulty.  The flexible ureteroscope was then placed through the access sheath and advanced into the renal pelvis without difficulty.  All calyces were examined and the calculus was identified in a lower pole calyx as described above  A 242 micron holmium laser fiber was placed through the ureteroscope and the portion of the stone protruding into the renal pelvis was dusted at a setting of 0.3 J / 60 Hz.  A portion of the stone within the infundibulum was also able be dusted however with maximum flexion the laser fiber passed superior to the remaining portion of the stone.  Attempts at dislodging the stone with the tip of ureteroscope and passing a guidewire alongside the stone were also unsuccessful.  Retrograde pyelograms performed and there did not appear to be hydrocalyx as seen on the prior CT.  No contrast extravasation was noted.  All calyces were then examined and no fragments larger than the tip of the laser fiber were identified.  The ureteral access sheath  and ureteroscope were removed in tandem and the ureter showed no evidence of injury or  perforation.  A 6 FR/22 CM Contour ureteral stent was placed under fluoroscopic guidance.  The wire was then removed with an adequate stent positioning was noted under fluoroscopy  The bladder was then emptied and the procedure ended.  The patient appeared to tolerate the procedure well and without complications.  After anesthetic reversal the patient was transported to the PACU in stable condition.   Plan: Since the infundibular calculus is lodged it is unlikely it will migrate to the renal pelvis and she will follow-up in approximately 1 week for cystoscopy with stent removal Will discuss additional options at that time including observation and PCNL   John Giovanni, MD

## 2020-11-11 ENCOUNTER — Encounter: Payer: Self-pay | Admitting: Urology

## 2020-11-12 ENCOUNTER — Encounter: Payer: Self-pay | Admitting: Urology

## 2020-11-12 MED ORDER — SODIUM CHLORIDE 0.9 % IR SOLN
Status: DC | PRN
  Administered 2020-11-10: 3000 mL

## 2020-11-18 ENCOUNTER — Other Ambulatory Visit: Payer: Self-pay

## 2020-11-18 ENCOUNTER — Ambulatory Visit (INDEPENDENT_AMBULATORY_CARE_PROVIDER_SITE_OTHER): Payer: Medicare HMO | Admitting: Urology

## 2020-11-18 ENCOUNTER — Encounter: Payer: Self-pay | Admitting: Urology

## 2020-11-18 VITALS — BP 147/81 | HR 103 | Ht 64.0 in | Wt 185.0 lb

## 2020-11-18 DIAGNOSIS — N200 Calculus of kidney: Secondary | ICD-10-CM | POA: Diagnosis not present

## 2020-11-18 LAB — URINALYSIS, COMPLETE
Bilirubin, UA: NEGATIVE
Glucose, UA: NEGATIVE
Ketones, UA: NEGATIVE
Nitrite, UA: NEGATIVE
Specific Gravity, UA: 1.02 (ref 1.005–1.030)
Urobilinogen, Ur: 0.2 mg/dL (ref 0.2–1.0)
pH, UA: 5.5 (ref 5.0–7.5)

## 2020-11-18 LAB — MICROSCOPIC EXAMINATION

## 2020-11-18 MED ORDER — LEVOFLOXACIN 500 MG PO TABS
500.0000 mg | ORAL_TABLET | Freq: Once | ORAL | Status: AC
  Administered 2020-11-18: 500 mg via ORAL

## 2020-11-18 NOTE — Progress Notes (Signed)
Indications: Patient is 65 y.o., who is s/p partial ureteroscopic removal of a left lower pole partial staghorn calculus 11/10/2020.  There was a portion of the stone protruding into the renal pelvis from the lower pole infundibulum which was treated however due to the lower pole location scope flexion would not allow stone access.  Based on location it is felt is unlikely there will be dislodgment of the stone and the patient is presenting today for stent removal.  Procedure:  Flexible Cystoscopy with stent removal [id number])  Timeout was performed and the correct patient, procedure and participants were identified.    Description:  The patient was prepped and draped in the usual sterile fashion. Flexible cystosopy was performed.  The stent was visualized, grasped, and removed intact without difficulty. The patient tolerated the procedure well.  A single dose of oral antibiotics was given.  Complications:  None  Plan:  Her flank pain has resolved and there was no evidence of hydrocalyx on intraoperative pyelography Since ureteroscopy was not successful we discussed observation versus PCNL She has elected observation if she remains asymptomatic Follow-up 3 months with KUB instructed to call earlier for development of any symptoms.  John Giovanni, MD

## 2020-11-24 DIAGNOSIS — M25411 Effusion, right shoulder: Secondary | ICD-10-CM | POA: Diagnosis not present

## 2020-11-24 DIAGNOSIS — M25511 Pain in right shoulder: Secondary | ICD-10-CM | POA: Diagnosis not present

## 2020-12-03 DIAGNOSIS — M25411 Effusion, right shoulder: Secondary | ICD-10-CM | POA: Diagnosis not present

## 2020-12-03 DIAGNOSIS — M25511 Pain in right shoulder: Secondary | ICD-10-CM | POA: Diagnosis not present

## 2020-12-09 DIAGNOSIS — M7501 Adhesive capsulitis of right shoulder: Secondary | ICD-10-CM | POA: Diagnosis not present

## 2020-12-09 DIAGNOSIS — S42251D Displaced fracture of greater tuberosity of right humerus, subsequent encounter for fracture with routine healing: Secondary | ICD-10-CM | POA: Diagnosis not present

## 2020-12-09 DIAGNOSIS — S42291D Other displaced fracture of upper end of right humerus, subsequent encounter for fracture with routine healing: Secondary | ICD-10-CM | POA: Diagnosis not present

## 2020-12-09 DIAGNOSIS — M62838 Other muscle spasm: Secondary | ICD-10-CM | POA: Diagnosis not present

## 2020-12-09 DIAGNOSIS — M25411 Effusion, right shoulder: Secondary | ICD-10-CM | POA: Diagnosis not present

## 2020-12-09 DIAGNOSIS — W010XXD Fall on same level from slipping, tripping and stumbling without subsequent striking against object, subsequent encounter: Secondary | ICD-10-CM | POA: Diagnosis not present

## 2020-12-09 DIAGNOSIS — M25511 Pain in right shoulder: Secondary | ICD-10-CM | POA: Diagnosis not present

## 2020-12-16 ENCOUNTER — Other Ambulatory Visit: Payer: Self-pay | Admitting: Physician Assistant

## 2020-12-16 DIAGNOSIS — Z1231 Encounter for screening mammogram for malignant neoplasm of breast: Secondary | ICD-10-CM

## 2020-12-16 DIAGNOSIS — M25411 Effusion, right shoulder: Secondary | ICD-10-CM | POA: Diagnosis not present

## 2020-12-16 DIAGNOSIS — M25511 Pain in right shoulder: Secondary | ICD-10-CM | POA: Diagnosis not present

## 2020-12-25 DIAGNOSIS — S42201A Unspecified fracture of upper end of right humerus, initial encounter for closed fracture: Secondary | ICD-10-CM | POA: Diagnosis not present

## 2020-12-29 DIAGNOSIS — S42251D Displaced fracture of greater tuberosity of right humerus, subsequent encounter for fracture with routine healing: Secondary | ICD-10-CM | POA: Diagnosis not present

## 2020-12-29 DIAGNOSIS — W010XXD Fall on same level from slipping, tripping and stumbling without subsequent striking against object, subsequent encounter: Secondary | ICD-10-CM | POA: Diagnosis not present

## 2020-12-29 DIAGNOSIS — S42291A Other displaced fracture of upper end of right humerus, initial encounter for closed fracture: Secondary | ICD-10-CM | POA: Diagnosis not present

## 2020-12-29 DIAGNOSIS — S42291D Other displaced fracture of upper end of right humerus, subsequent encounter for fracture with routine healing: Secondary | ICD-10-CM | POA: Diagnosis not present

## 2020-12-29 DIAGNOSIS — M25541 Pain in joints of right hand: Secondary | ICD-10-CM | POA: Diagnosis not present

## 2020-12-29 DIAGNOSIS — M25511 Pain in right shoulder: Secondary | ICD-10-CM | POA: Diagnosis not present

## 2020-12-29 DIAGNOSIS — M62838 Other muscle spasm: Secondary | ICD-10-CM | POA: Diagnosis not present

## 2021-01-21 DIAGNOSIS — Z0100 Encounter for examination of eyes and vision without abnormal findings: Secondary | ICD-10-CM | POA: Diagnosis not present

## 2021-02-01 DIAGNOSIS — M25511 Pain in right shoulder: Secondary | ICD-10-CM | POA: Diagnosis not present

## 2021-02-01 DIAGNOSIS — G8929 Other chronic pain: Secondary | ICD-10-CM | POA: Diagnosis not present

## 2021-02-01 DIAGNOSIS — M62838 Other muscle spasm: Secondary | ICD-10-CM | POA: Diagnosis not present

## 2021-02-01 DIAGNOSIS — S42291D Other displaced fracture of upper end of right humerus, subsequent encounter for fracture with routine healing: Secondary | ICD-10-CM | POA: Diagnosis not present

## 2021-02-01 DIAGNOSIS — S42251D Displaced fracture of greater tuberosity of right humerus, subsequent encounter for fracture with routine healing: Secondary | ICD-10-CM | POA: Diagnosis not present

## 2021-02-01 DIAGNOSIS — W010XXD Fall on same level from slipping, tripping and stumbling without subsequent striking against object, subsequent encounter: Secondary | ICD-10-CM | POA: Diagnosis not present

## 2021-02-08 DIAGNOSIS — E210 Primary hyperparathyroidism: Secondary | ICD-10-CM | POA: Diagnosis not present

## 2021-02-16 ENCOUNTER — Other Ambulatory Visit: Payer: Self-pay | Admitting: *Deleted

## 2021-02-16 DIAGNOSIS — N200 Calculus of kidney: Secondary | ICD-10-CM

## 2021-02-16 DIAGNOSIS — M25411 Effusion, right shoulder: Secondary | ICD-10-CM | POA: Diagnosis not present

## 2021-02-16 DIAGNOSIS — E210 Primary hyperparathyroidism: Secondary | ICD-10-CM | POA: Diagnosis not present

## 2021-02-16 DIAGNOSIS — M25511 Pain in right shoulder: Secondary | ICD-10-CM | POA: Diagnosis not present

## 2021-02-17 ENCOUNTER — Encounter: Payer: Self-pay | Admitting: Urology

## 2021-02-17 ENCOUNTER — Other Ambulatory Visit: Payer: Self-pay

## 2021-02-17 ENCOUNTER — Ambulatory Visit
Admission: RE | Admit: 2021-02-17 | Discharge: 2021-02-17 | Disposition: A | Discharge status: Home / Self Care | Payer: Medicare HMO | Attending: Urology | Admitting: Urology

## 2021-02-17 ENCOUNTER — Ambulatory Visit: Payer: Medicare HMO | Admitting: Urology

## 2021-02-17 ENCOUNTER — Ambulatory Visit
Admission: RE | Admit: 2021-02-17 | Discharge: 2021-02-17 | Disposition: A | Discharge status: Home / Self Care | Payer: Medicare HMO | Source: Ambulatory Visit | Attending: Urology | Admitting: Urology

## 2021-02-17 VITALS — BP 140/80 | HR 74 | Ht 64.0 in | Wt 185.0 lb

## 2021-02-17 DIAGNOSIS — N200 Calculus of kidney: Secondary | ICD-10-CM

## 2021-02-17 DIAGNOSIS — D259 Leiomyoma of uterus, unspecified: Secondary | ICD-10-CM | POA: Diagnosis not present

## 2021-02-17 DIAGNOSIS — R3915 Urgency of urination: Secondary | ICD-10-CM

## 2021-02-17 NOTE — Progress Notes (Signed)
02/17/2021 10:36 AM   Michele Sanders [birth year] [id number]  Referring provider: Patrice Paradise, MD [address] [facility] [place],  [place] [address]  Chief Complaint  Patient presents with   Nephrolithiasis    Urologic history:  1.  Nephrolithiasis Partial left lower pole staghorn on CT 03/2020 Initially elected observation Subsequently developed left flank pain July 2022 which was felt possibly secondary to calyceal obstruction After discussing options she elected ureteroscopy Initially scheduled 7/22 however upper tract could not be accessed with ureteroscope and a stent was placed Follow-up ureteroscopy performed 11/10/2020.  The only part of the stone that could be treated was appropriate treating into the renal pelvis from a lower pole infundibulum.  The ureteroscope could not be flexed sufficiently to access this infundibulum Her pain resolved and stent was removed 11/18/2020 and she elected observation Denies recurrent flank pain Has had persistent frequency and urgency since stent removal Had no improvement with Myrbetriq   HPI: 65 y.o. female presents for follow-up.  Denies recurrent flank pain Has had persistent frequency and urgency since stent removal Had no improvement with Myrbetriq Denies gross hematuria   PMH: Past Medical History:  Diagnosis Date   Arthritis    GERD (gastroesophageal reflux disease)    History of kidney stones    Hyperparathyroidism ([facility])    Hypertension     Surgical History: Past Surgical History:  Procedure Laterality Date   APPENDECTOMY     BACK SURGERY     L4-L5   BREAST BIOPSY Left 04/09/2019   neg   BREAST CYST ASPIRATION Left 04/08/2019   neg   COLONOSCOPY W/ POLYPECTOMY     CYSTOSCOPY/URETEROSCOPY/HOLMIUM LASER/STENT PLACEMENT Left 09/29/2020   Procedure: CYSTOSCOPY/URETEROSCOPY, /STENT PLACEMENT;  Surgeon: Riki Altes, MD;  Location: [facility] ORS;  Service: Urology;  Laterality: Left;    CYSTOSCOPY/URETEROSCOPY/HOLMIUM LASER/STENT PLACEMENT Left 11/10/2020   Procedure: CYSTOSCOPY/URETEROSCOPY/HOLMIUM LASER/STENT PLACEMENT;  Surgeon: Riki Altes, MD;  Location: [facility] OR[facility];  Service: Urology;  Laterality: Left;   FRACTURE SURGERY Right    humerus 2005    Home Medications:  Allergies as of 02/17/2021   No Known Allergies      Medication List        Accurate as of February 17, 2021 10:36 AM. If you have any questions, ask your nurse or doctor.          STOP taking these medications    HYDROcodone-acetaminophen 5-325 MG tablet Commonly known as: NORCO/VICODIN Stopped by: Riki Altes, MD   magnesium oxide 400 MG tablet Commonly known as: MAG-OX Stopped by: Riki Altes, MD   mirabegron ER 50 MG Tb24 tablet Commonly known as: MYRBETRIQ Stopped by: Riki Altes, MD   pantoprazole 40 MG tablet Commonly known as: Protonix Stopped by: Riki Altes, MD   tamsulosin 0.4 MG Caps capsule Commonly known as: FLOMAX Stopped by: Riki Altes, MD       TAKE these medications    acetaminophen 500 MG tablet Commonly known as: TYLENOL Take 1,000 mg by mouth every 6 (six) hours as needed for moderate pain or mild pain.   aspirin 81 MG chewable tablet Chew 81 mg by mouth daily.   Baclofen 5 MG Tabs Take 5 mg by mouth 2 (two) times daily as needed for muscle pain.   lisinopril 10 MG tablet Commonly known as: ZESTRIL Take 10 mg by mouth every morning.   Melatonin 10 MG Tbcr Take 10 mg by mouth at bedtime.  Omega-3 1000 MG Caps Take 1,000 mg by mouth daily.   OVER THE COUNTER MEDICATION Take 10 mg by mouth daily. CBD oil   simvastatin 40 MG tablet Commonly known as: ZOCOR Take 40 mg by mouth daily at 6 PM.   Vitamin D3 50 MCG (2000 UT) Tabs Take 2,000 Units by mouth daily.   zinc gluconate 50 MG tablet Take 50 mg by mouth daily.        Allergies: No Known Allergies  Family History: Family History  Problem Relation Age of  Onset   Breast cancer Neg Hx     Social History:  reports that she has been smoking cigarettes. She has a 15.00 pack-year smoking history. She has never used smokeless tobacco. She reports current alcohol use. She reports that she does not use drugs.   Physical Exam: BP 140/80    Pulse 74    Ht 5\' 4"  (1.626 m)    Wt 185 lb (83.9 kg)    BMI 31.76 kg/m   Constitutional:  Alert and oriented, No acute distress. HEENT: [place] AT, moist mucus membranes.  Trachea midline, no masses. Cardiovascular: No clubbing, cyanosis, or edema. Respiratory: Normal respiratory effort, no increased work of breathing. Psychiatric: Normal mood and affect.   Pertinent Imaging: KUB images performed today were personally viewed and interpreted   Assessment & Plan:    1.  Left nephrolithiasis Stone burden appears stable when compared with Intra-Op images 11/10/2020 KUB was reviewed after she had left the office and will contact with results.  If she desires to continue observation would recommend repeat KUB 6 months or earlier for flank pain  2.  Urinary frequency/urgency Check UA Discussed other options including pelvic floor PT and PTNS   Riki Altes, MD  [facility] [address] [place], [place] [address] [phone]

## 2021-02-19 DIAGNOSIS — M25411 Effusion, right shoulder: Secondary | ICD-10-CM | POA: Diagnosis not present

## 2021-02-19 DIAGNOSIS — M25511 Pain in right shoulder: Secondary | ICD-10-CM | POA: Diagnosis not present

## 2021-02-20 ENCOUNTER — Encounter: Payer: Self-pay | Admitting: Urology

## 2021-02-23 DIAGNOSIS — R29898 Other symptoms and signs involving the musculoskeletal system: Secondary | ICD-10-CM | POA: Diagnosis not present

## 2021-05-07 ENCOUNTER — Ambulatory Visit
Admission: RE | Admit: 2021-05-07 | Discharge: 2021-05-07 | Disposition: A | Discharge status: Home / Self Care | Payer: Medicare HMO | Source: Ambulatory Visit | Attending: Physician Assistant | Admitting: Physician Assistant

## 2021-05-07 ENCOUNTER — Other Ambulatory Visit: Payer: Self-pay

## 2021-05-07 DIAGNOSIS — Z1231 Encounter for screening mammogram for malignant neoplasm of breast: Secondary | ICD-10-CM | POA: Diagnosis not present

## 2021-05-12 ENCOUNTER — Other Ambulatory Visit: Payer: Self-pay | Admitting: Physician Assistant

## 2021-05-12 DIAGNOSIS — R928 Other abnormal and inconclusive findings on diagnostic imaging of breast: Secondary | ICD-10-CM

## 2021-05-12 DIAGNOSIS — N6489 Other specified disorders of breast: Secondary | ICD-10-CM

## 2021-05-27 ENCOUNTER — Ambulatory Visit
Admission: RE | Admit: 2021-05-27 | Discharge: 2021-05-27 | Disposition: A | Discharge status: Home / Self Care | Payer: Medicare HMO | Source: Ambulatory Visit | Attending: Physician Assistant | Admitting: Physician Assistant

## 2021-05-27 ENCOUNTER — Other Ambulatory Visit: Payer: Self-pay

## 2021-05-27 DIAGNOSIS — N6489 Other specified disorders of breast: Secondary | ICD-10-CM | POA: Insufficient documentation

## 2021-05-27 DIAGNOSIS — R928 Other abnormal and inconclusive findings on diagnostic imaging of breast: Secondary | ICD-10-CM | POA: Insufficient documentation

## 2021-09-14 IMAGING — US US BREAST*L* LIMITED INC AXILLA
1 series · 8 of 8 positions shown · non-contrast
Comparison: Previous exam(s).

CLINICAL DATA: 64-year-old female presenting for delayed follow-up
of an enlarged left axillary lymph node thought to be related to a
COVID vaccine injection.

EXAM:
DIGITAL DIAGNOSTIC BILATERAL MAMMOGRAM WITH TOMOSYNTHESIS AND CAD;
ULTRASOUND LEFT BREAST LIMITED
TECHNIQUE: Bilateral digital diagnostic mammography and breast tomosynthesis
was performed. The images were evaluated with computer-aided
detection.; Targeted ultrasound examination of the left breast was
performed

[Series 1: us breast*left* limited inc axilla · 0.09mm/px · 8 of 8 slices shown]
[im 1/8]
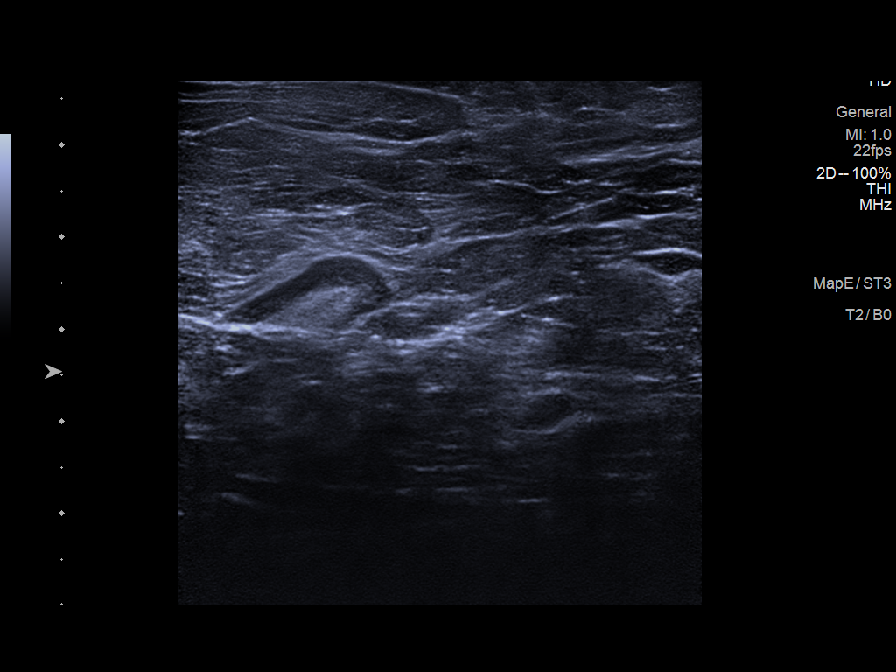
[im 2/8]
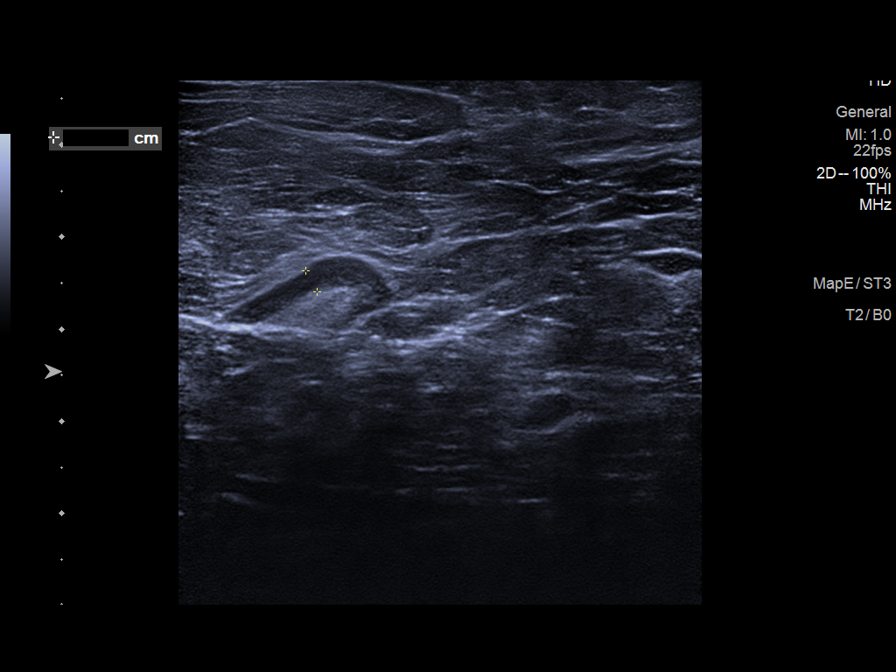
[im 3/8]
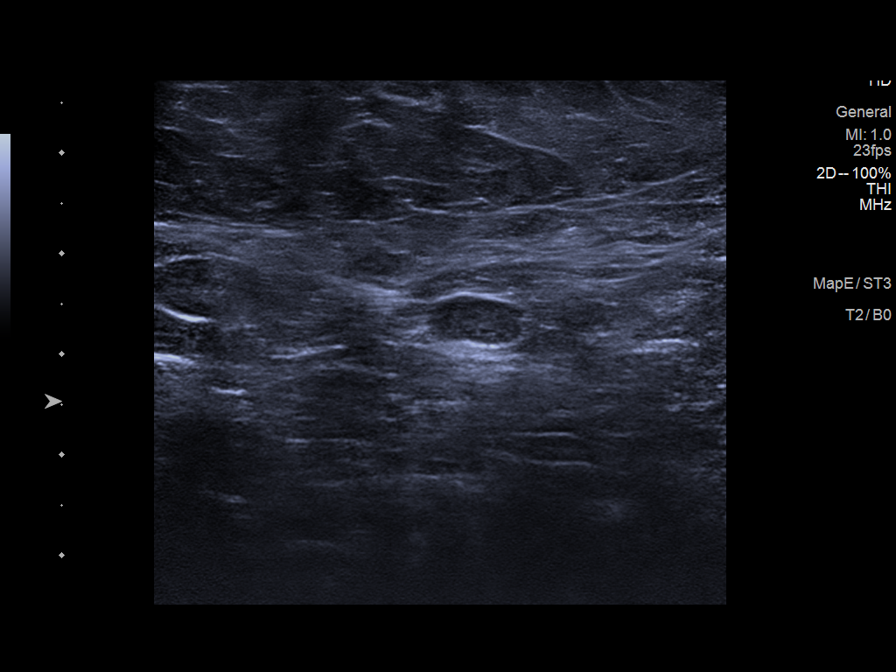
[im 4/8]
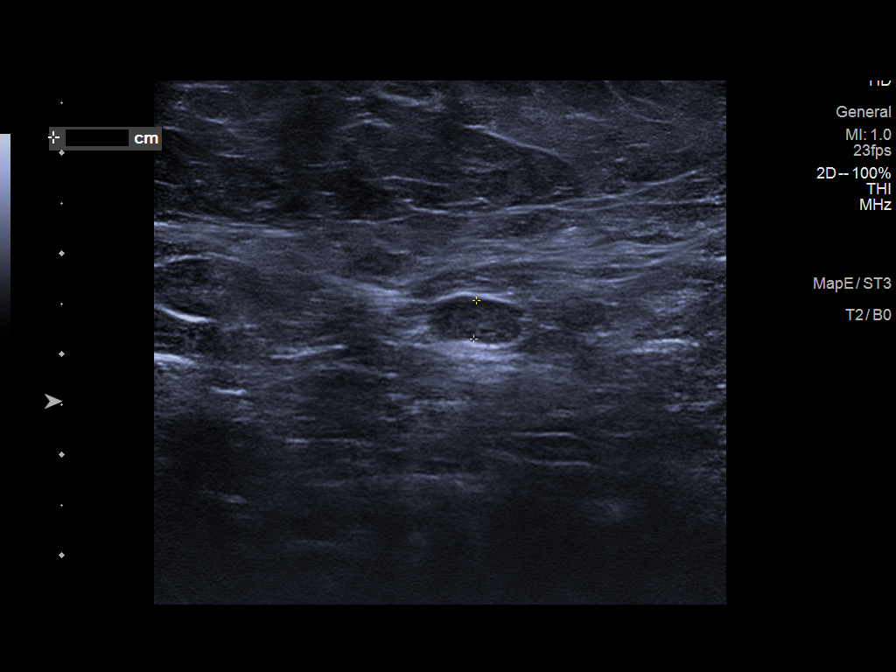
[im 5/8]
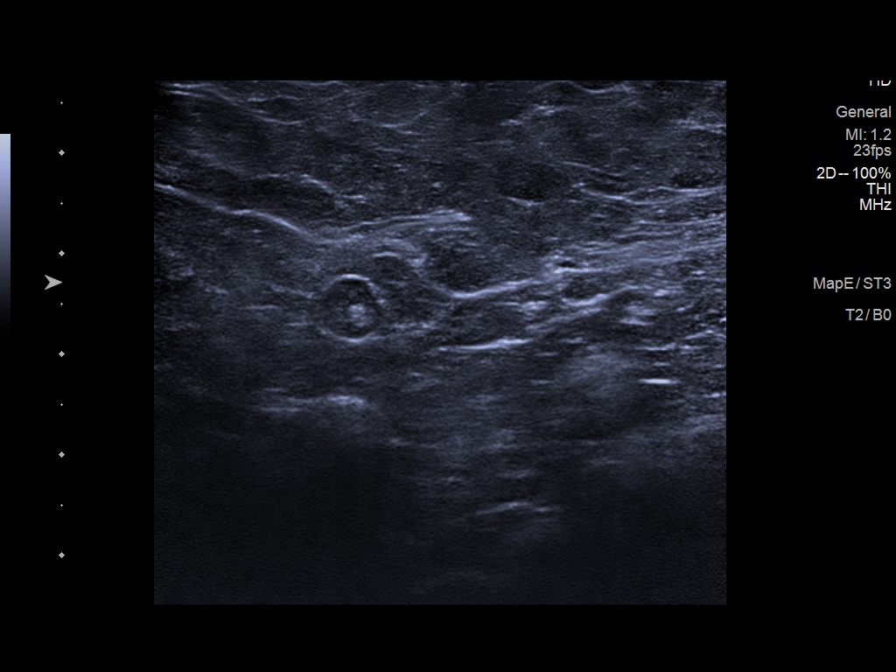
[im 6/8]
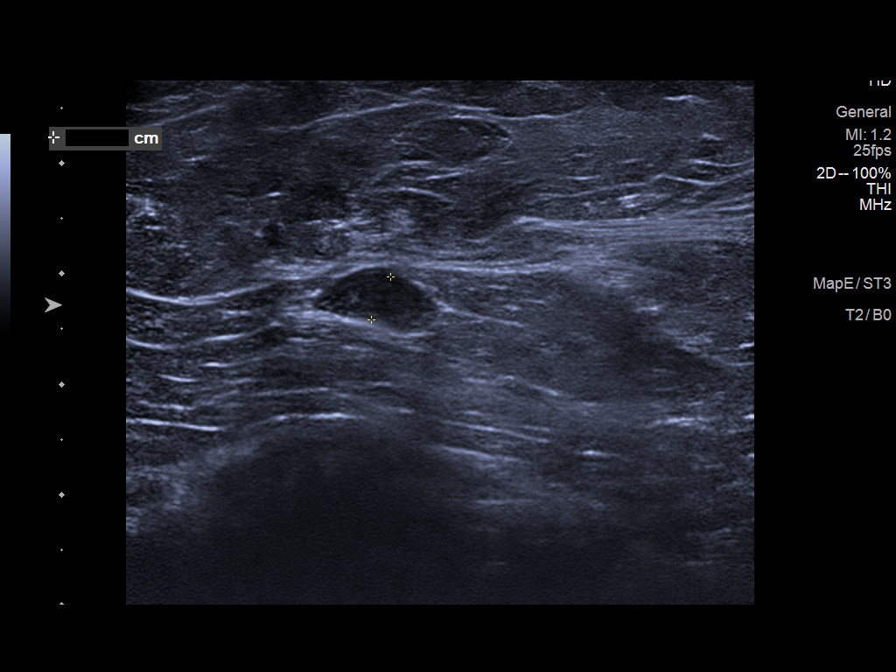
[im 7/8]
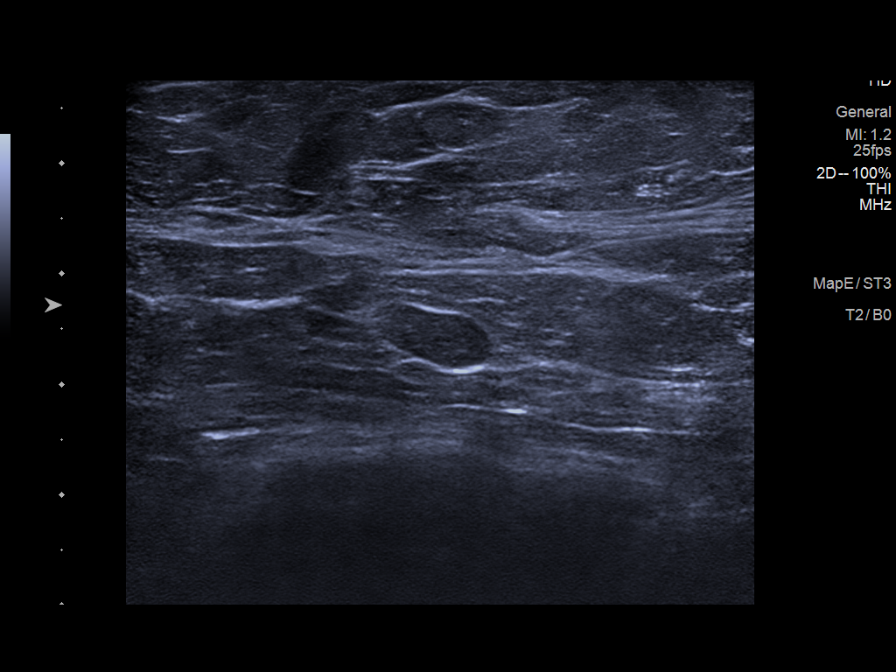
[im 8/8]
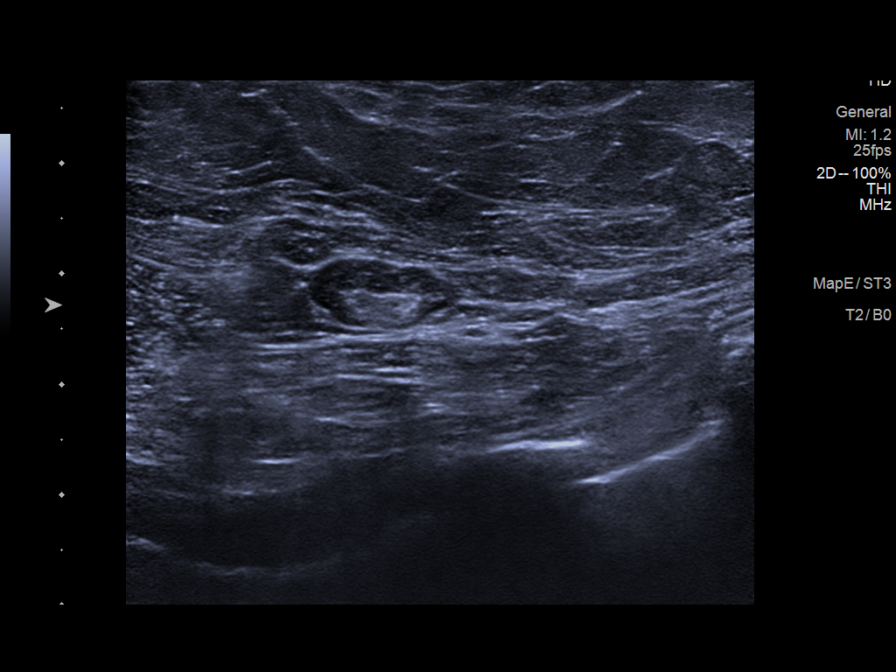

[8 of 8 positions shown; findings below may reference images not displayed]

ACR Breast Density Category b: There are scattered areas of
fibroglandular density.
FINDINGS: No suspicious calcifications, masses or areas of distortion are seen
in the bilateral breasts.

Ultrasound of the left axilla demonstrates that the lymph node
previously identified in the left axilla which measured 8 mm in long
axis has decreased in size now measuring 4 mm.
IMPRESSION: 1. Significant decrease in size of the enlarged lymph node in the
left axilla. The enlargement was likely secondary to the patient's
COVID vaccination.

2.  No mammographic evidence of malignancy in the bilateral breasts.

RECOMMENDATION:
Screening mammogram in one year.(Code:C0-W-1IU)

I have discussed the findings and recommendations with the patient.
If applicable, a reminder letter will be sent to the patient
regarding the next appointment.

BI-RADS CATEGORY  1: Negative.

## 2021-09-14 IMAGING — MG DIGITAL DIAGNOSTIC BILAT W/ TOMO W/ CAD
8 series · 8 of 24 positions shown · non-contrast
Comparison: Previous exam(s).

CLINICAL DATA: 64-year-old female presenting for delayed follow-up
of an enlarged left axillary lymph node thought to be related to a
COVID vaccine injection.

EXAM:
DIGITAL DIAGNOSTIC BILATERAL MAMMOGRAM WITH TOMOSYNTHESIS AND CAD;
ULTRASOUND LEFT BREAST LIMITED
TECHNIQUE: Bilateral digital diagnostic mammography and breast tomosynthesis
was performed. The images were evaluated with computer-aided
detection.; Targeted ultrasound examination of the left breast was
performed

[L MLO synth-2D]
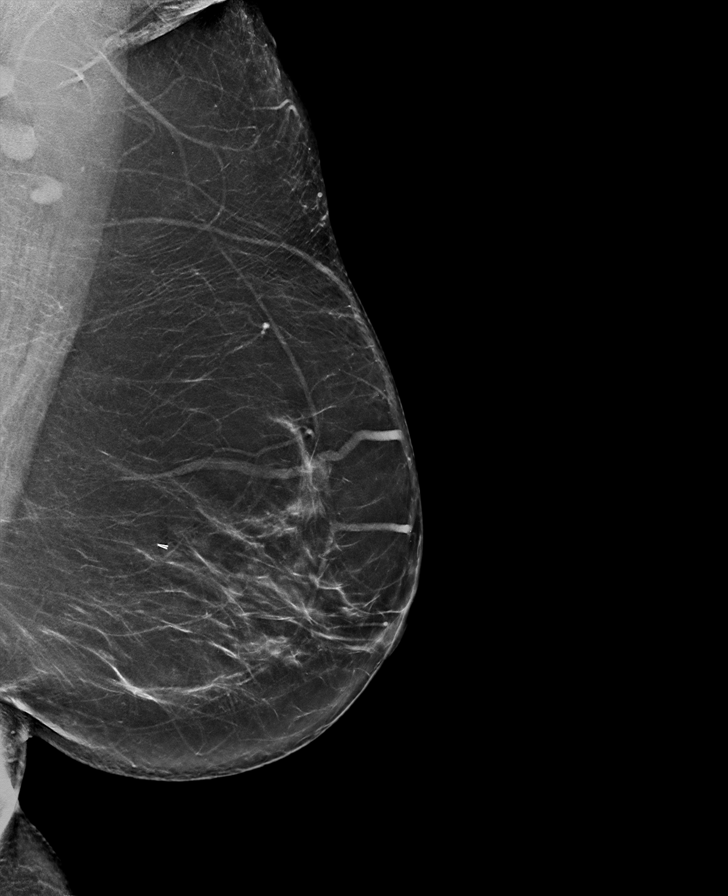

[R CC synth-2D]
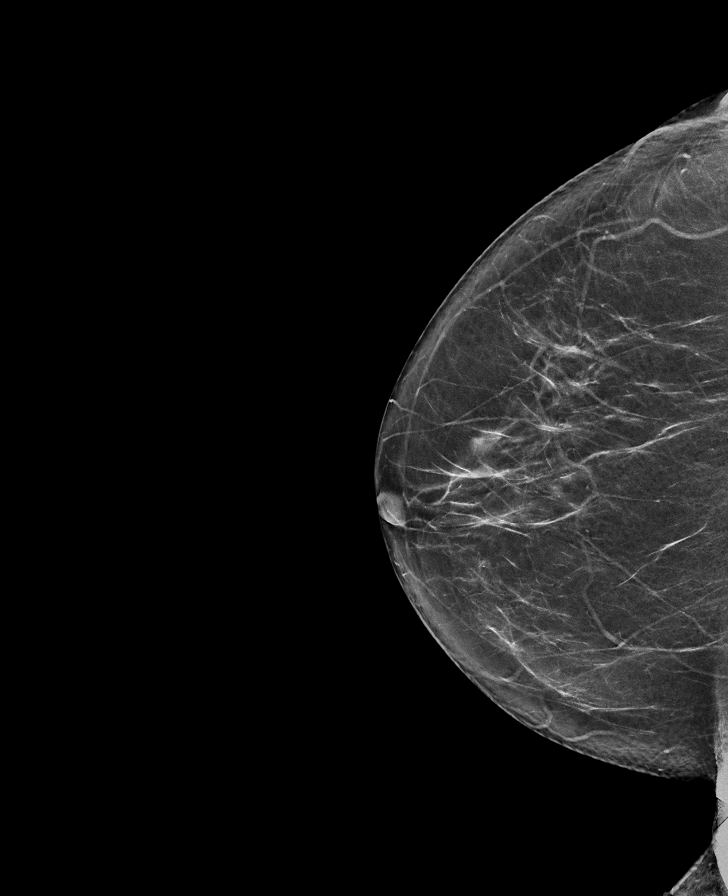

[R MLO synth-2D]
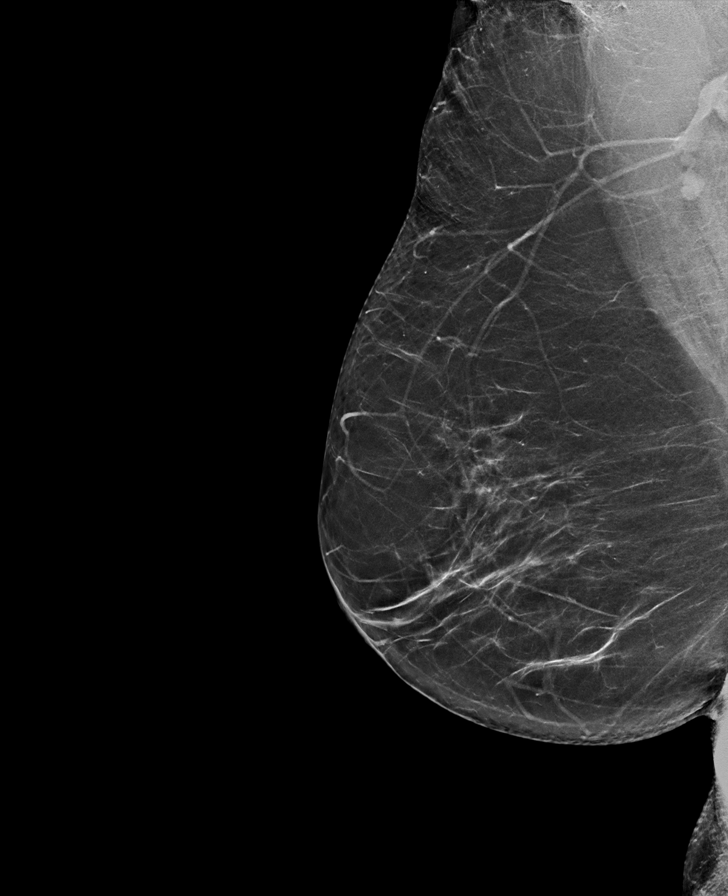

[L CC synth-2D]
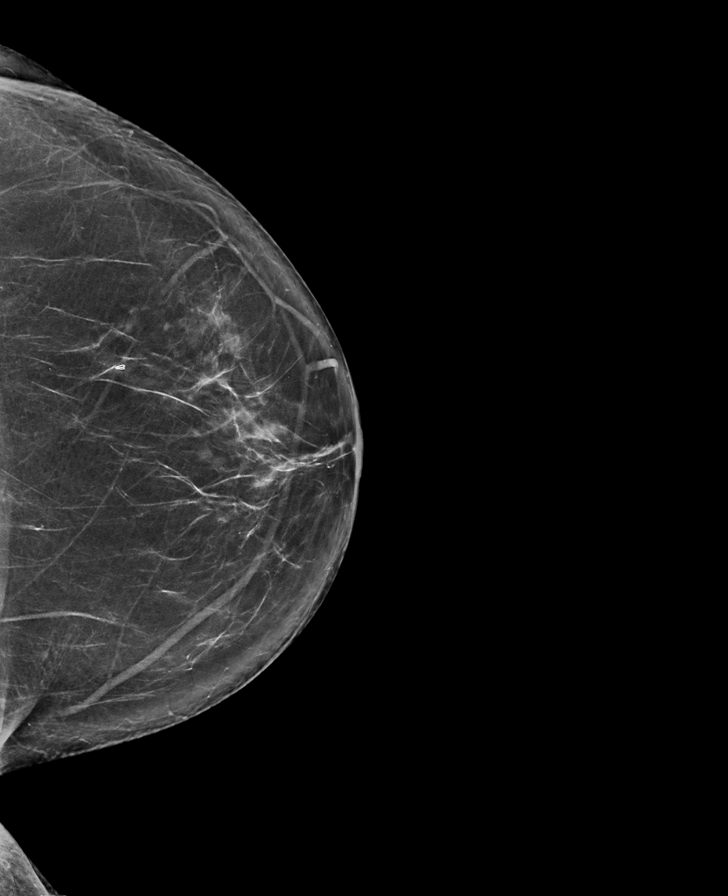

[R MLO tomo · tomo slice 35/70.0]
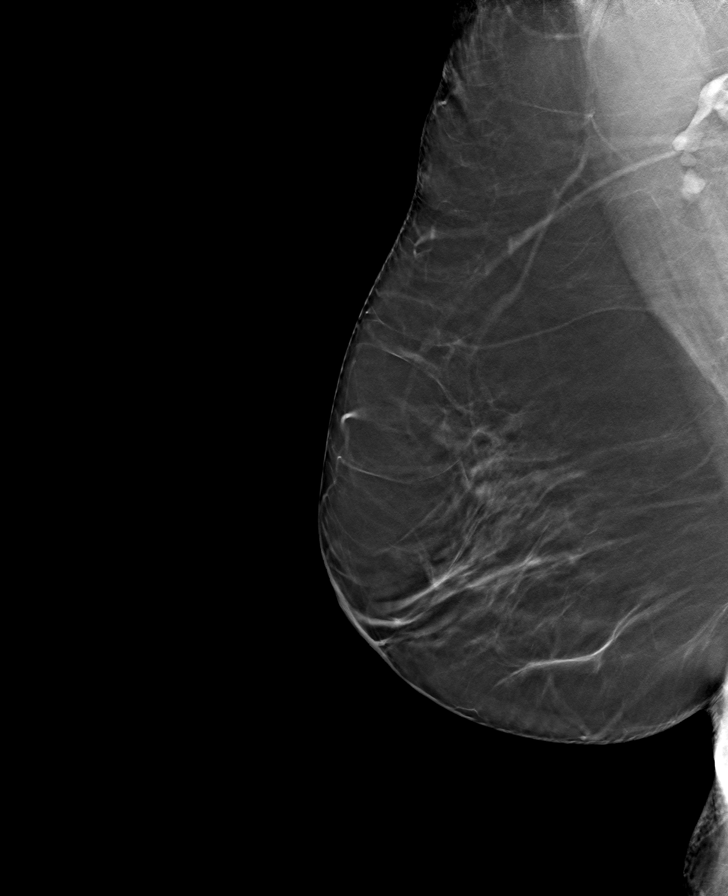

[R CC tomo · tomo slice 37/73.0]
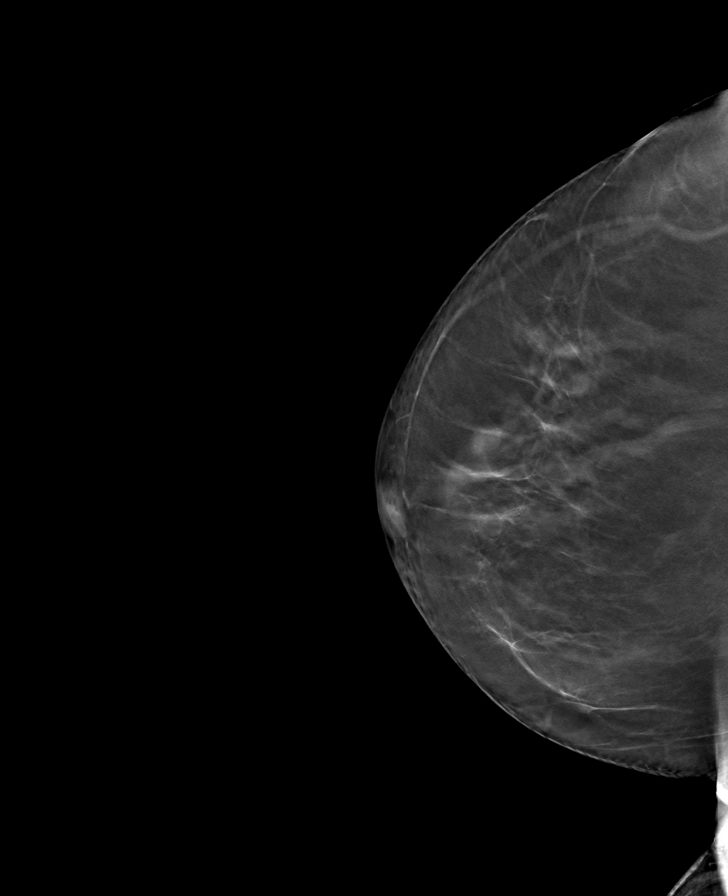

[L MLO tomo · tomo slice 38/75.0]
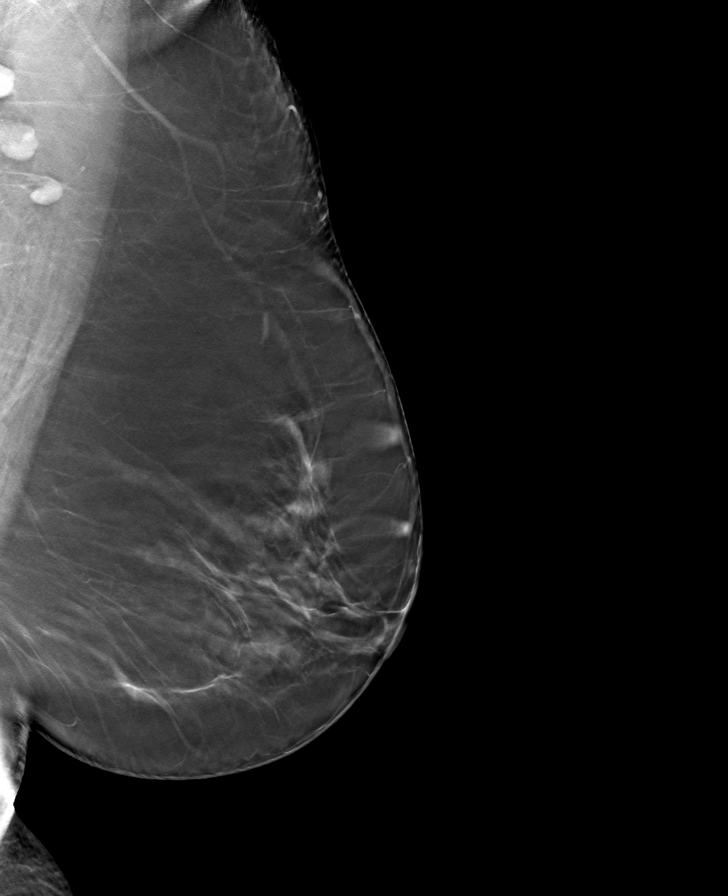

[L CC tomo · tomo slice 40/79.0]
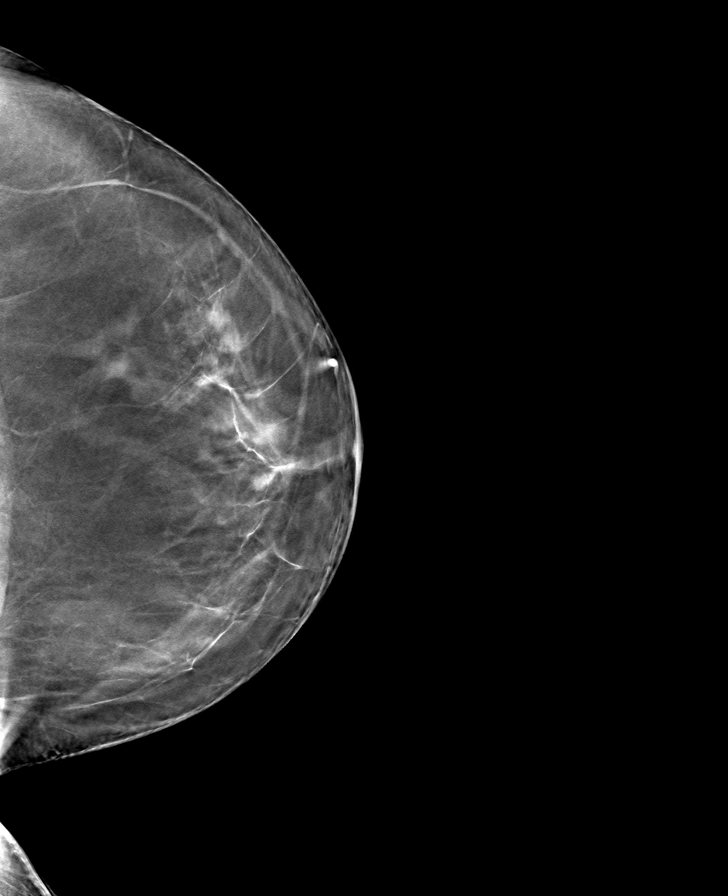

[8 of 24 positions shown; findings below may reference images not displayed]

ACR Breast Density Category b: There are scattered areas of
fibroglandular density.
FINDINGS: No suspicious calcifications, masses or areas of distortion are seen
in the bilateral breasts.

Ultrasound of the left axilla demonstrates that the lymph node
previously identified in the left axilla which measured 8 mm in long
axis has decreased in size now measuring 4 mm.
IMPRESSION: 1. Significant decrease in size of the enlarged lymph node in the
left axilla. The enlargement was likely secondary to the patient's
COVID vaccination.

2.  No mammographic evidence of malignancy in the bilateral breasts.

RECOMMENDATION:
Screening mammogram in one year.(Code:C0-W-1IU)

I have discussed the findings and recommendations with the patient.
If applicable, a reminder letter will be sent to the patient
regarding the next appointment.

BI-RADS CATEGORY  1: Negative.

## 2021-11-12 ENCOUNTER — Ambulatory Visit: Payer: Medicare HMO | Admitting: Physician Assistant

## 2021-11-12 ENCOUNTER — Ambulatory Visit
Admission: RE | Admit: 2021-11-12 | Discharge: 2021-11-12 | Disposition: A | Discharge status: Home / Self Care | Payer: Medicare HMO | Source: Ambulatory Visit | Attending: Urology | Admitting: Urology

## 2021-11-12 ENCOUNTER — Other Ambulatory Visit: Payer: Self-pay | Admitting: Physician Assistant

## 2021-11-12 ENCOUNTER — Ambulatory Visit
Admission: RE | Admit: 2021-11-12 | Discharge: 2021-11-12 | Disposition: A | Discharge status: Home / Self Care | Payer: Medicare HMO | Attending: Physician Assistant | Admitting: Physician Assistant

## 2021-11-12 ENCOUNTER — Other Ambulatory Visit
Admission: RE | Admit: 2021-11-12 | Discharge: 2021-11-12 | Disposition: A | Discharge status: Home / Self Care | Payer: Medicare HMO | Source: Home / Self Care | Attending: Physician Assistant | Admitting: Physician Assistant

## 2021-11-12 ENCOUNTER — Encounter: Payer: Self-pay | Admitting: Physician Assistant

## 2021-11-12 ENCOUNTER — Ambulatory Visit
Admission: RE | Admit: 2021-11-12 | Discharge: 2021-11-12 | Disposition: A | Discharge status: Home / Self Care | Payer: Medicare HMO | Source: Ambulatory Visit | Attending: Physician Assistant | Admitting: Physician Assistant

## 2021-11-12 ENCOUNTER — Telehealth: Payer: Self-pay

## 2021-11-12 VITALS — BP 170/81 | HR 111 | Ht 64.0 in | Wt 185.0 lb

## 2021-11-12 DIAGNOSIS — N200 Calculus of kidney: Secondary | ICD-10-CM

## 2021-11-12 DIAGNOSIS — Z87442 Personal history of urinary calculi: Secondary | ICD-10-CM | POA: Diagnosis present

## 2021-11-12 DIAGNOSIS — N3001 Acute cystitis with hematuria: Secondary | ICD-10-CM | POA: Diagnosis present

## 2021-11-12 DIAGNOSIS — N12 Tubulo-interstitial nephritis, not specified as acute or chronic: Secondary | ICD-10-CM | POA: Diagnosis not present

## 2021-11-12 LAB — URINALYSIS, COMPLETE (UACMP) WITH MICROSCOPIC
Bilirubin Urine: NEGATIVE
Glucose, UA: NEGATIVE mg/dL
Ketones, ur: NEGATIVE mg/dL
Nitrite: NEGATIVE
Protein, ur: 100 mg/dL — AB
Specific Gravity, Urine: 1.005 (ref 1.005–1.030)
WBC, UA: >50 WBC/hpf — ABNORMAL HIGH (ref 0–5)
pH: 6 (ref 5.0–8.0)

## 2021-11-12 MED ORDER — SULFAMETHOXAZOLE-TRIMETHOPRIM 800-160 MG PO TABS: 1.0000 | ORAL_TABLET | Freq: Two times a day (BID) | ORAL | 0 refills | Status: AC

## 2021-11-12 MED ORDER — CEFTRIAXONE SODIUM 1 G IJ SOLR
1.0000 g | Freq: Once | INTRAMUSCULAR | Status: AC
  Administered 2021-11-12: 1 g via INTRAMUSCULAR

## 2021-11-12 NOTE — Progress Notes (Signed)
11/12/2021 11:48 AM   Michele Sanders Apr 21, 1955 [id number]  CC: Chief Complaint  Patient presents with   Nephrolithiasis   HPI: Michele Sanders is a 66 y.o. female with PMH left partial staghorn stone who underwent partial ureteroscopy with Dr. Lonna Cobb in 2022 currently on observation who presents today for evaluation of possible acute stone episode.   Today she reports a 1 day history of left flank pain with dysuria, frequency, and intermittent gross hematuria.  She had a fever yesterday, Tmax 100 F at home.  She did not take any medications for this and it subsequently resolved.  She currently rates her pain 8/10 in intensity.  She denies nausea or vomiting.  Notably, she was diagnosed with COVID about 9 days ago and completed antiviral therapy.  KUB today with interval increase in size of the left partial staghorn stone.  She was sent from clinic for a stat CT stone study, which showed significant left perinephric stranding without hydronephrosis or ureteral stones.  He confirmed interval increase in size of the left partial staghorn stone.  She had cereal this morning at 0930, and has been sipping on water as recently as 1100.  Hospital lab UA today positive for large blood, large leukocytes, and proteinuria; urine microscopy with >50 WBCs/HPF, rare bacteria, and WBC clumps.   PMH: Past Medical History:  Diagnosis Date   Arthritis    GERD (gastroesophageal reflux disease)    History of kidney stones    Hyperparathyroidism (HCC)    Hypertension     Surgical History: Past Surgical History:  Procedure Laterality Date   APPENDECTOMY     BACK SURGERY     L4-L5   BREAST BIOPSY Left 04/09/2019   neg   BREAST CYST ASPIRATION Left 04/08/2019   neg   COLONOSCOPY W/ POLYPECTOMY     CYSTOSCOPY/URETEROSCOPY/HOLMIUM LASER/STENT PLACEMENT Left 09/29/2020   Procedure: CYSTOSCOPY/URETEROSCOPY, /STENT PLACEMENT;  Surgeon: Riki Altes, MD;  Location: [facility];  Service: Urology;   Laterality: Left;   CYSTOSCOPY/URETEROSCOPY/HOLMIUM LASER/STENT PLACEMENT Left 11/10/2020   Procedure: CYSTOSCOPY/URETEROSCOPY/HOLMIUM LASER/STENT PLACEMENT;  Surgeon: Riki Altes, MD;  Location: [facility];  Service: Urology;  Laterality: Left;   FRACTURE SURGERY Right    humerus 2005    Home Medications:  Allergies as of 11/12/2021   No Known Allergies      Medication List        Accurate as of November 12, 2021 11:48 AM. If you have any questions, ask your nurse or doctor.          STOP taking these medications    acetaminophen 500 MG tablet Commonly known as: TYLENOL Stopped by: Carman Ching, PA-C   Omega-3 1000 MG Caps Stopped by: Carman Ching, PA-C   Vitamin D3 50 MCG (2000 UT) Tabs Stopped by: Carman Ching, PA-C   zinc gluconate 50 MG tablet Stopped by: Carman Ching, PA-C       TAKE these medications    aspirin 81 MG chewable tablet Chew 81 mg by mouth daily.   Baclofen 5 MG Tabs Take 5 mg by mouth 2 (two) times daily as needed for muscle pain.   lisinopril 10 MG tablet Commonly known as: ZESTRIL Take 10 mg by mouth every morning.   Melatonin 10 MG Tbcr Take 10 mg by mouth at bedtime.   OVER THE COUNTER MEDICATION Take 10 mg by mouth daily. CBD oil   simvastatin 40 MG tablet Commonly known as: ZOCOR Take 40 mg by mouth daily at 6 PM.  Allergies:  No Known Allergies  Family History: Family History  Problem Relation Age of Onset   Breast cancer Neg Hx     Social History:   reports that she has been smoking cigarettes. She has a 15.00 pack-year smoking history. She has never used smokeless tobacco. She reports current alcohol use. She reports that she does not use drugs.  Physical Exam: BP (!) 170/81   Pulse (!) 111   Ht 5\' 4"  (1.626 m)   Wt 185 lb (83.9 kg)   BMI 31.76 kg/m   Constitutional:  Alert and oriented, no acute distress, nontoxic appearing HEENT: [place], AT Cardiovascular: No  clubbing, cyanosis, or edema Respiratory: Normal respiratory effort, no increased work of breathing Skin: No rashes, bruises or suspicious lesions Neurologic: Grossly intact, no focal deficits, moving all 4 extremities Psychiatric: Normal mood and affect  Laboratory Data: Results for orders placed or performed during the hospital encounter of 11/12/21  Urinalysis, Complete w Microscopic Urine, Clean Catch  Result Value Ref Range   Color, Urine YELLOW (A) YELLOW   APPearance CLOUDY (A) CLEAR   Specific Gravity, Urine 1.005 1.005 - 1.030   pH 6.0 5.0 - 8.0   Glucose, UA NEGATIVE NEGATIVE mg/dL   Hgb urine dipstick LARGE (A) NEGATIVE   Bilirubin Urine NEGATIVE NEGATIVE   Ketones, ur NEGATIVE NEGATIVE mg/dL   Protein, ur 540 (A) NEGATIVE mg/dL   Nitrite NEGATIVE NEGATIVE   Leukocytes,Ua LARGE (A) NEGATIVE   RBC / HPF 0-5 0 - 5 RBC/hpf   WBC, UA >50 (H) 0 - 5 WBC/hpf   Bacteria, UA RARE (A) NONE SEEN   Squamous Epithelial / LPF 0-5 0 - 5   WBC Clumps PRESENT    Pertinent Imaging: KUB, 11/12/2021: CLINICAL DATA:  Flank pain.   EXAM: ABDOMEN - 1 VIEW   COMPARISON:  One-view abdomen 11/18/2020   FINDINGS: Increasing stone burden noted at the lower pole of the left kidney. Bowel gas pattern is normal. Calcified uterine fibroid again noted.   IMPRESSION: Increasing stone burden at the lower pole of the left kidney.     Electronically Signed   By: Marin Roberts M.D.   On: 11/12/2021 12:44  Stat CT stone study, 11/12/2021: CLINICAL DATA:  Flank pain.  Kidney stone suspected.   EXAM: CT ABDOMEN AND PELVIS WITHOUT CONTRAST   TECHNIQUE: Multidetector CT imaging of the abdomen and pelvis was performed following the standard protocol without IV contrast.   RADIATION DOSE REDUCTION: This exam was performed according to the departmental dose-optimization program which includes automated exposure control, adjustment of the mA and/or kV according to patient size and/or use  of iterative reconstruction technique.   COMPARISON:  One-view abdomen 11/12/2021. CT abdomen without contrast 04/06/2020   FINDINGS: Lower chest: The lung bases are clear without focal nodule, mass, or airspace disease. Heart size is normal. No significant pleural or pericardial effusion is present.   Hepatobiliary: Fatty infiltration of the liver is again seen. No discrete lesion is present. Common bile duct and gallbladder are within normal limits.   Pancreas: Unremarkable. No pancreatic ductal dilatation or surrounding inflammatory changes.   Spleen: Normal in size without focal abnormality.   Adrenals/Urinary Tract: Adrenal glands are normal bilaterally. Water density lesion projecting laterally from the right kidney has increased in size, now measuring 5.2 cm. No follow-up imaging is recommended. The right kidney is otherwise unremarkable.   Multiple large stones are present at the lower pole of the left kidney. Largest stone measures 17  mm. Inflammatory changes are present about the left kidney. Punctate nonobstructing stone is present at the upper pole of the left kidney. Hydronephrosis is present. Left ureter is not dilated. Distal left ureter is within normal limits to the urinary bladder. Urinary bladder is unremarkable.   Stomach/Bowel: The stomach and duodenum are within normal limits. Small bowel is unremarkable. Terminal ileum is within normal limits. Appendix is surgically absent. Diverticular changes are present in the distal ascending colon and throughout the descending and sigmoid colon without focal inflammation to suggest diverticulitis.   Vascular/Lymphatic: Atherosclerotic calcifications are present in the aorta and branch vessels. No aneurysm is present. No significant adenopathy is present.   Reproductive: Calcified uterine fibroid is stable. Uterus and adnexa are otherwise unremarkable.   Other: No abdominal wall hernia or abnormality. No  abdominopelvic ascites.   Musculoskeletal: Degenerative changes are present in the lumbar spine. No focal osseous lesions are present. Bony pelvis is within normal limits. The hips are located and within normal limits bilaterally.   IMPRESSION: 1. Multiple large stones at the lower pole of the left kidney measuring up to 17 mm. 2. Inflammatory changes about the left kidney suggesting pyelonephritis in the setting of abnormal urinalysis. 3. Punctate nonobstructing stone at the upper pole of the left kidney. 4. Hepatic steatosis. 5. Stable calcified uterine fibroid. 6. Aortic Atherosclerosis (ICD10-I70.0).     Electronically Signed   By: Marin Roberts M.D.   On: 11/12/2021 12:43  I personally reviewed the images referenced above and note interval increase in the size of her left partial staghorn stone.  On CT, there is significant perinephric stranding without evidence of acute urinary obstruction..  Assessment & Plan:   1. Left nephrolithiasis Interval increase in size of her left partial staghorn stone, likely contributory to #2 below.  There is no evidence of acute urinary obstruction at this time; no indication for urgent urologic intervention.  We will plan to have her follow-up with Dr. Apolinar Junes in 2 weeks to discuss PCNL. - CT RENAL STONE STUDY; Future  2. Pyelonephritis of left kidney Perinephric stranding and grossly infected UA consistent with left pyelonephritis.  We administered 1 dose of IM Rocephin in clinic and will transition her to Bactrim DS twice daily x9 days for total of 10 days of tissue penetrating antibiotics.  I have sent her urine for culture via the hospital lab and we will call her if we need to switch her antibiotic therapy.  We discussed return precautions today including worsening fever, pain, or general condition.  We did discuss that she may have some persistent low-grade fevers for up to 72 hours after initiation of antibiotics.  She expressed  understanding. - cefTRIAXone (ROCEPHIN) injection 1 g - sulfamethoxazole-trimethoprim (BACTRIM DS) 800-160 MG tablet; Take 1 tablet by mouth 2 (two) times daily for 9 days.  Dispense: 18 tablet; Refill: 0   Return in about 2 weeks (around 11/26/2021) for Discuss PCNL with Dr. Apolinar Junes.  Carman Ching, PA-C  [facility] [address] [place], [place] [address] [phone]

## 2021-11-12 NOTE — Telephone Encounter (Signed)
Incoming call from pt on triage line who states that she has a hx of kidney stones and is having moderate pain left sided flank, she has also noticed blood in her urine over night, as well as low grade fever. Pt advised to have KUB completed and follow up on our office today as scheduled. Pt voiced understanding.

## 2021-11-12 NOTE — Patient Instructions (Signed)
I have sent your oral antibiotic prescription to the [facility] on [address].  Please start this tomorrow morning.  You will take it twice a day for 9 days.  I will call you if I need to switch your antibiotics based on your urine culture results. Your low-grade fevers and discomfort may continue for up to 72 hours after starting antibiotics.  If your fever, pain, or general condition worsen, please go to the emergency department because you may need IV antibiotics. We will plan to see you back in clinic in 2 weeks with Dr. Erlene Quan to discuss PCNL to treat your left-sided kidney stone.  More information about this procedure is found at the back of this packet.

## 2021-11-14 LAB — URINE CULTURE: Culture: >=100000 — AB

## 2021-12-01 ENCOUNTER — Ambulatory Visit: Payer: Medicare HMO | Admitting: Urology

## 2021-12-01 ENCOUNTER — Telehealth: Payer: Self-pay | Admitting: Urology

## 2021-12-01 VITALS — BP 141/103 | HR 88 | Ht 64.0 in | Wt 188.0 lb

## 2021-12-01 DIAGNOSIS — E213 Hyperparathyroidism, unspecified: Secondary | ICD-10-CM | POA: Diagnosis not present

## 2021-12-01 DIAGNOSIS — N200 Calculus of kidney: Secondary | ICD-10-CM | POA: Diagnosis not present

## 2021-12-01 DIAGNOSIS — N12 Tubulo-interstitial nephritis, not specified as acute or chronic: Secondary | ICD-10-CM

## 2021-12-01 LAB — URINALYSIS, COMPLETE
Bilirubin, UA: NEGATIVE
Glucose, UA: NEGATIVE
Ketones, UA: NEGATIVE
Nitrite, UA: NEGATIVE
Specific Gravity, UA: 1.02 (ref 1.005–1.030)
Urobilinogen, Ur: 0.2 mg/dL (ref 0.2–1.0)
pH, UA: 5.5 (ref 5.0–7.5)

## 2021-12-01 LAB — MICROSCOPIC EXAMINATION: WBC, UA: >30 /hpf — AB (ref 0–5)

## 2021-12-01 NOTE — Telephone Encounter (Signed)
Pt had OV 12/01/21  and needed the upcoming procedure code for her insurance.  Please call Aissa Lisowski at [phone]

## 2021-12-01 NOTE — Progress Notes (Signed)
12/01/2021 11:01 AM   Michele Sanders Jul 05, 1955 [id number]  Referring provider: Patrice Paradise, MD [address] [facility] [place],  [place] [address]  Chief Complaint  Patient presents with   Follow-up    Discuss PCNL    HPI: 66 year old female who presents today to discuss possible left PCNL.  She has a personal history of nephrolithiasis and underwent ureteroscopy x2 last year for left-sided stone burden.  This is a Dr. Lonna Cobb.  Upon review of operative report, it appears there was some significant angulation as well as infundibular stenosis into the lower pole some of the stone of which was fragmented with some was not able to be addressed.  She had a hard time tolerating the stent with significant urge incontinence.  More recently, she developed what is likely left pyelonephritis.  She been treated with a course of antibiotics and started feel better.  Urine culture ended up growing Klebsiella and she was treated with IM Rocephin and transition to additional 10 days of Bactrim.  As part of the work-up for this, she had another CT abdomen pelvis on 11/22/2021 which showed fairly significant stone burden primarily in the left lower pole up to 17 mm with surrounding inflammatory changes as well as presence of hydronephrosis.  There is no obvious ureteral calculi on the study.  Notably, she does have a personal history of elevated parathyroid hormone is now status post parathyroidectomy.  Clinically she is feeling better.  No flank pain.   PMH: Past Medical History:  Diagnosis Date   Arthritis    GERD (gastroesophageal reflux disease)    History of kidney stones    Hyperparathyroidism (HCC)    Hypertension     Surgical History: Past Surgical History:  Procedure Laterality Date   APPENDECTOMY     BACK SURGERY     L4-L5   BREAST BIOPSY Left 04/09/2019   neg   BREAST CYST ASPIRATION Left 04/08/2019   neg   COLONOSCOPY W/ POLYPECTOMY      CYSTOSCOPY/URETEROSCOPY/HOLMIUM LASER/STENT PLACEMENT Left 09/29/2020   Procedure: CYSTOSCOPY/URETEROSCOPY, /STENT PLACEMENT;  Surgeon: Riki Altes, MD;  Location: [facility];  Service: Urology;  Laterality: Left;   CYSTOSCOPY/URETEROSCOPY/HOLMIUM LASER/STENT PLACEMENT Left 11/10/2020   Procedure: CYSTOSCOPY/URETEROSCOPY/HOLMIUM LASER/STENT PLACEMENT;  Surgeon: Riki Altes, MD;  Location: [facility];  Service: Urology;  Laterality: Left;   FRACTURE SURGERY Right    humerus 2005    Home Medications:  Allergies as of 12/01/2021   No Known Allergies      Medication List        Accurate as of December 01, 2021 11:01 AM. If you have any questions, ask your nurse or doctor.          STOP taking these medications    Baclofen 5 MG Tabs   Melatonin 10 MG Tbcr       TAKE these medications    aspirin 81 MG chewable tablet Chew 81 mg by mouth daily.   lisinopril 10 MG tablet Commonly known as: ZESTRIL Take 10 mg by mouth every morning.   OVER THE COUNTER MEDICATION Take 10 mg by mouth daily. CBD oil   simvastatin 40 MG tablet Commonly known as: ZOCOR Take 40 mg by mouth daily at 6 PM.        Allergies: No Known Allergies  Family History: Family History  Problem Relation Age of Onset   Breast cancer Neg Hx     Social History:  reports that she has been smoking  cigarettes. She has a 15.00 pack-year smoking history. She has never used smokeless tobacco. She reports current alcohol use. She reports that she does not use drugs.   Physical Exam: BP (!) 141/103   Pulse 88   Ht 5\' 4"  (1.626 m)   Wt 188 lb (85.3 kg)   BMI 32.27 kg/m   Constitutional:  Alert and oriented, No acute distress. HEENT: [place] AT, moist mucus membranes.  Trachea midline, no masses. Neurologic: Grossly intact, no focal deficits, moving all 4 extremities. Psychiatric: Normal mood and affect.  Laboratory Data: Lab Results  Component Value Date   WBC 14.4 (H) 02/13/2018   HGB 15.2 (H)  02/13/2018   HCT 45.2 02/13/2018   MCV 92.8 02/13/2018   PLT 310 02/13/2018    Lab Results  Component Value Date   CREATININE 0.58 02/13/2018    Urinalysis Results for orders placed or performed in visit on 12/01/21  Microscopic Examination   Urine  Result Value Ref Range   WBC, UA >30 (A) 0 - 5 /hpf   RBC, Urine 11-30 (A) 0 - 2 /hpf   Epithelial Cells (non renal) 0-10 0 - 10 /hpf   Bacteria, UA Many (A) None seen/Few  Urinalysis, Complete  Result Value Ref Range   Specific Gravity, UA 1.020 1.005 - 1.030   pH, UA 5.5 5.0 - 7.5   Color, UA Yellow Yellow   Appearance Ur Hazy (A) Clear   Leukocytes,UA 2+ (A) Negative   Protein,UA 1+ (A) Negative/Trace   Glucose, UA Negative Negative   Ketones, UA Negative Negative   RBC, UA 2+ (A) Negative   Bilirubin, UA Negative Negative   Urobilinogen, Ur 0.2 0.2 - 1.0 mg/dL   Nitrite, UA Negative Negative   Microscopic Examination See below:       Pertinent Imaging:  CT RENAL STONE STUDY  Narrative CLINICAL DATA:  Flank pain.  Kidney stone suspected.  EXAM: CT ABDOMEN AND PELVIS WITHOUT CONTRAST  TECHNIQUE: Multidetector CT imaging of the abdomen and pelvis was performed following the standard protocol without IV contrast.  RADIATION DOSE REDUCTION: This exam was performed according to the departmental dose-optimization program which includes automated exposure control, adjustment of the mA and/or kV according to patient size and/or use of iterative reconstruction technique.  COMPARISON:  One-view abdomen 11/12/2021. CT abdomen without contrast 04/06/2020  FINDINGS: Lower chest: The lung bases are clear without focal nodule, mass, or airspace disease. Heart size is normal. No significant pleural or pericardial effusion is present.  Hepatobiliary: Fatty infiltration of the liver is again seen. No discrete lesion is present. Common bile duct and gallbladder are within normal limits.  Pancreas: Unremarkable. No  pancreatic ductal dilatation or surrounding inflammatory changes.  Spleen: Normal in size without focal abnormality.  Adrenals/Urinary Tract: Adrenal glands are normal bilaterally. Water density lesion projecting laterally from the right kidney has increased in size, now measuring 5.2 cm. No follow-up imaging is recommended. The right kidney is otherwise unremarkable.  Multiple large stones are present at the lower pole of the left kidney. Largest stone measures 17 mm. Inflammatory changes are present about the left kidney. Punctate nonobstructing stone is present at the upper pole of the left kidney. Hydronephrosis is present. Left ureter is not dilated. Distal left ureter is within normal limits to the urinary bladder. Urinary bladder is unremarkable.  Stomach/Bowel: The stomach and duodenum are within normal limits. Small bowel is unremarkable. Terminal ileum is within normal limits. Appendix is surgically absent. Diverticular changes are present  in the distal ascending colon and throughout the descending and sigmoid colon without focal inflammation to suggest diverticulitis.  Vascular/Lymphatic: Atherosclerotic calcifications are present in the aorta and branch vessels. No aneurysm is present. No significant adenopathy is present.  Reproductive: Calcified uterine fibroid is stable. Uterus and adnexa are otherwise unremarkable.  Other: No abdominal wall hernia or abnormality. No abdominopelvic ascites.  Musculoskeletal: Degenerative changes are present in the lumbar spine. No focal osseous lesions are present. Bony pelvis is within normal limits. The hips are located and within normal limits bilaterally.  IMPRESSION: 1. Multiple large stones at the lower pole of the left kidney measuring up to 17 mm. 2. Inflammatory changes about the left kidney suggesting pyelonephritis in the setting of abnormal urinalysis. 3. Punctate nonobstructing stone at the upper pole of the  left kidney. 4. Hepatic steatosis. 5. Stable calcified uterine fibroid. 6. Aortic Atherosclerosis (ICD10-I70.0).   Electronically Signed By: Marin Roberts M.D. On: 11/12/2021 12:43  The above CT scan was personally reviewed and I also was able to bring up the images and shared them with the patient as well.  There is evidence of what looks like layering within a dilated left lower pole calyx consistent with obstructed calyx.  Suspect this likely is related to previous partial fragmentation within a hydronephrotic calyx from infundibular stenosis.  There is also additional stones within that same compound calyx adjacent to this.   Assessment & Plan:    1. Nephrolithiasis Primarily left lower pole stone burden within fairly complex anatomy with left lower pole infundibular stenosis and a dilated lower pole compound calyx.  Unfortunately, ureteroscopic manipulation secondary to this particular anatomic variant has its constraints.  I reviewed the patient's particular anatomy with her as well as drew her pictures to help her understand.  We discussed various options including expectant management/observation and the risk and benefits of this which could include recurrent infections or cortical loss over time.  Alternatively, we discussed the option of percutaneous nephrolithotomy.  We discussed the procedure at length which involves interventional radiology localization into the dilated calyx, renal dilation and endoscopic removal of her stone burden.  Discussed the anatomic concerns as well given the infundibular stenosis, angulation of the additional stones as relates to the primary dilated calyx which would likely preclude nephroscopic removal.  Potentially, antegrade ureteroscopy may be possible to treat this additional stone burden but could not be guaranteed.  She understands that there is a very high risk of eventually needing a ureteral stent placed in antegrade fashion as well as the  need for possible staged retrograde ureteroscopy to treat and clear her of all residual stone burden based on her anatomic complexity.  We discussed the additional risk of bleeding, infection, damage surrounding structures, urine leak which is expected for period of time, possible need for blood transfusion around 20% in these cases, overnight admission at minimum amongst others.  All of her questions were answered.  Alternatively, could consider another attempt at retrograde treatment of this stone burden although this has been attempted and previously unsuccessful although there is some evidence that there has been some fragmentation of the left lower pole stone.  Ultimately, after discussing all of the risk and benefits, she is unsure how she would like to proceed.  She is interested in knowing the cost differential for these procedures would like to run the CPT code by her insurance company as well as look at her deductible and think about timing for this.  In addition, she  would like her daughter to be involved in the decision making.  She was post to come today but is unable to do so.  She is interested in a second virtual visit to include her daughter which is quite reasonable.  Lastly, she mentions that she tolerated the stent very poorly in the past.  We will likely start her on a beta 3 agonist at least a week before the procedure to help her tolerate this better with as needed oxybutynin as a strategy to facilitate this and make the stent more tolerable.  2. Pyelonephritis of left kidney UA remains somewhat suspicious but she is otherwise asymptomatic today  We will repeat urine culture and treat as needed - Urinalysis, Complete - CULTURE, URINE COMPREHENSIVE  3. Hyperparathyroidism (HCC) Contributing factor to stone burden     Vanna Scotland, MD  [facility] [address] [place], [place] [address] [phone]  I spent 42 total minutes on  the day of the encounter including pre-visit review of the medical record, face-to-face time with the patient, and post visit ordering of labs/imaging/tests.

## 2021-12-02 ENCOUNTER — Encounter: Payer: Self-pay | Admitting: Urology

## 2021-12-03 NOTE — Telephone Encounter (Signed)
Spoke with patient and she will wait until the final result comes back. She will call back on Monday

## 2021-12-04 LAB — CULTURE, URINE COMPREHENSIVE

## 2021-12-06 ENCOUNTER — Telehealth: Payer: Self-pay

## 2021-12-06 MED ORDER — SULFAMETHOXAZOLE-TRIMETHOPRIM 800-160 MG PO TABS: 1.0000 | ORAL_TABLET | Freq: Two times a day (BID) | ORAL | 0 refills | Status: DC

## 2021-12-06 NOTE — Telephone Encounter (Signed)
Informed patient of results and bactrim rx sent into pharmacy.

## 2021-12-06 NOTE — Telephone Encounter (Signed)
LMOM informed patient CPT code for PCNL is 50080.

## 2021-12-06 NOTE — Telephone Encounter (Signed)
-----   Message from Hollice Espy, MD sent at 12/06/2021  1:34 PM EDT ----- Urine culture grew a low colony count of Klebsiella.  Please treat with Bactrim DS twice daily for 7 days given her complex stone history.  Hollice Espy, MD

## 2021-12-08 ENCOUNTER — Telehealth: Payer: Medicare HMO | Admitting: Urology

## 2021-12-08 ENCOUNTER — Encounter: Payer: Self-pay | Admitting: Urology

## 2021-12-08 DIAGNOSIS — N200 Calculus of kidney: Secondary | ICD-10-CM

## 2021-12-08 NOTE — Progress Notes (Unsigned)
Virtual Visit via Video Note  I connected with Michele Sanders on 12/08/21 at  3:15 PM EDT by a video enabled telemedicine application and verified that I am speaking with the correct person using two identifiers.  Location: Patient: *** Provider: ***   I discussed the limitations of evaluation and management by telemedicine and the availability of in person appointments. The patient expressed understanding and agreed to proceed.  History of Present Illness:    Observations/Objective:   Assessment and Plan:   Follow Up Instructions:    I discussed the assessment and treatment plan with the patient. The patient was provided an opportunity to ask questions and all were answered. The patient agreed with the plan and demonstrated an understanding of the instructions.   The patient was advised to call back or seek an in-person evaluation if the symptoms worsen or if the condition fails to improve as anticipated.  I provided *** minutes of non-face-to-face time during this encounter.   Hollice Espy, MD

## 2021-12-09 ENCOUNTER — Other Ambulatory Visit: Payer: Self-pay | Admitting: Urology

## 2021-12-09 DIAGNOSIS — N200 Calculus of kidney: Secondary | ICD-10-CM

## 2021-12-09 NOTE — Progress Notes (Signed)
Surgical Physician Order Form [facility] [place]  * Scheduling expectation : Next Available; patient prefers after Thanksgiving  *Length of Case:   *Clearance needed: no  *Anticoagulation Instructions: Hold all anticoagulants  *Aspirin Instructions: Hold Aspirin  *Post-op visit Date/Instructions:  1 month with RUS prior  *Diagnosis: Left Nephrolithiasis  *Procedure: left PCNL (29021)   Additional orders: N/A  -Admit type: Observation  -Anesthesia: General  -VTE Prophylaxis Standing Order SCD's       Other:   -Standing Lab Orders Per Anesthesia    Lab other: UA&Urine Culture; CBC; BMP; INR; type and screen  -Standing Test orders EKG/Chest x-ray per Anesthesia       Test other:   - Medications:  Ancef 2gm IV  -Other orders:   Please provide patient with either Gemtesa or Myrbetriq samples to take starting 1 week prior to the procedure

## 2021-12-21 ENCOUNTER — Ambulatory Visit: Payer: Medicare HMO | Admitting: Physician Assistant

## 2021-12-21 VITALS — BP 162/83 | HR 92 | Ht 64.0 in | Wt 188.4 lb

## 2021-12-21 DIAGNOSIS — N390 Urinary tract infection, site not specified: Secondary | ICD-10-CM | POA: Diagnosis not present

## 2021-12-21 LAB — URINALYSIS, COMPLETE
Bilirubin, UA: NEGATIVE
Glucose, UA: NEGATIVE
Ketones, UA: NEGATIVE
Nitrite, UA: NEGATIVE
Specific Gravity, UA: 1.015 (ref 1.005–1.030)
Urobilinogen, Ur: 0.2 mg/dL (ref 0.2–1.0)
pH, UA: 6 (ref 5.0–7.5)

## 2021-12-21 LAB — MICROSCOPIC EXAMINATION: WBC, UA: >30 /hpf — AB (ref 0–5)

## 2021-12-21 MED ORDER — CEFTRIAXONE SODIUM 1 G IJ SOLR
1.0000 g | Freq: Once | INTRAMUSCULAR | Status: AC
  Administered 2021-12-21: 1 g via INTRAMUSCULAR

## 2021-12-21 MED ORDER — CIPROFLOXACIN HCL 500 MG PO TABS: 500.0000 mg | ORAL_TABLET | Freq: Two times a day (BID) | ORAL | 0 refills | Status: AC

## 2021-12-21 NOTE — Patient Instructions (Signed)
The antibiotics you received today will last 24 hours. You may start the Cipro I'm prescribing you tomorrow morning. Please refrain from strenuous physical activity while on Cipro.  I'll start you on daily suppressive antibiotics based on your urine culture results. I'll call you once I get your culture results back so we can pick the best antibiotic for this.

## 2021-12-21 NOTE — Progress Notes (Signed)
12/21/2021 5:04 PM   Michele Sanders 1956-01-31 [id number]  CC: Chief Complaint  Patient presents with   Urinary Tract Infection   HPI: Michele Sanders is a 66 y.o. female with PMH partial left staghorn stone scheduled for PCNL with Dr. Apolinar Junes next month who presents today for evaluation of possible UTI.  I recently treated her for complicated UTI with IM Rocephin followed by 9 days of Bactrim.  Today she reports she finished Bactrim 8 days ago.  Yesterday, she had a return of dysuria, chills, cloudy urine, mild left flank pain radiating to the left lower quadrant, Tmax 100.2 F.  She denies nausea or vomiting.  She took Tylenol this morning at home.  In-office UA today positive for 1+ blood, 2+ protein, and 3+ leukocytes; urine microscopy with >30 WBCs/HPF, 3-10 RBCs/HPF, and moderate bacteria.  PMH: Past Medical History:  Diagnosis Date   Arthritis    GERD (gastroesophageal reflux disease)    History of kidney stones    Hyperparathyroidism (HCC)    Hypertension     Surgical History: Past Surgical History:  Procedure Laterality Date   APPENDECTOMY     BACK SURGERY     L4-L5   BREAST BIOPSY Left 04/09/2019   neg   BREAST CYST ASPIRATION Left 04/08/2019   neg   COLONOSCOPY W/ POLYPECTOMY     CYSTOSCOPY/URETEROSCOPY/HOLMIUM LASER/STENT PLACEMENT Left 09/29/2020   Procedure: CYSTOSCOPY/URETEROSCOPY, /STENT PLACEMENT;  Surgeon: Riki Altes, MD;  Location: [facility] ORS;  Service: Urology;  Laterality: Left;   CYSTOSCOPY/URETEROSCOPY/HOLMIUM LASER/STENT PLACEMENT Left 11/10/2020   Procedure: CYSTOSCOPY/URETEROSCOPY/HOLMIUM LASER/STENT PLACEMENT;  Surgeon: Riki Altes, MD;  Location: [facility] ORS;  Service: Urology;  Laterality: Left;   FRACTURE SURGERY Right    humerus 2005    Home Medications:  Allergies as of 12/21/2021   No Known Allergies      Medication List        Accurate as of December 21, 2021  5:04 PM. If you have any questions, ask your nurse or doctor.           STOP taking these medications    sulfamethoxazole-trimethoprim 800-160 MG tablet Commonly known as: BACTRIM DS       TAKE these medications    aspirin 81 MG chewable tablet Chew 81 mg by mouth daily.   ciprofloxacin 500 MG tablet Commonly known as: Cipro Take 1 tablet (500 mg total) by mouth 2 (two) times daily for 7 days.   lisinopril 10 MG tablet Commonly known as: ZESTRIL Take 10 mg by mouth every morning.   OVER THE COUNTER MEDICATION Take 10 mg by mouth daily. CBD oil   simvastatin 40 MG tablet Commonly known as: ZOCOR Take 40 mg by mouth daily at 6 PM.        Allergies:  No Known Allergies  Family History: Family History  Problem Relation Age of Onset   Breast cancer Neg Hx     Social History:   reports that she has been smoking cigarettes. She has a 15.00 pack-year smoking history. She has never used smokeless tobacco. She reports current alcohol use. She reports that she does not use drugs.  Physical Exam: BP (!) 162/83   Pulse 92   Ht 5\' 4"  (1.626 m)   Wt 188 lb 6.4 oz (85.5 kg)   BMI 32.34 kg/m   Constitutional:  Alert and oriented, no acute distress, nontoxic appearing HEENT: [place], AT Cardiovascular: No clubbing, cyanosis, or edema Respiratory: Normal respiratory effort, no increased work of  breathing GU: No CVA tenderness Skin: No rashes, bruises or suspicious lesions Neurologic: Grossly intact, no focal deficits, moving all 4 extremities Psychiatric: Normal mood and affect  Laboratory Data: Results for orders placed or performed in visit on 12/21/21  Microscopic Examination   Urine  Result Value Ref Range   WBC, UA >30 (A) 0 - 5 /hpf   RBC, Urine 3-10 (A) 0 - 2 /hpf   Epithelial Cells (non renal) 0-10 0 - 10 /hpf   Bacteria, UA Moderate (A) None seen/Few  Urinalysis, Complete  Result Value Ref Range   Specific Gravity, UA 1.015 1.005 - 1.030   pH, UA 6.0 5.0 - 7.5   Color, UA Yellow Yellow   Appearance Ur Hazy (A) Clear    Leukocytes,UA 3+ (A) Negative   Protein,UA 2+ (A) Negative/Trace   Glucose, UA Negative Negative   Ketones, UA Negative Negative   RBC, UA 1+ (A) Negative   Bilirubin, UA Negative Negative   Urobilinogen, Ur 0.2 0.2 - 1.0 mg/dL   Nitrite, UA Negative Negative   Microscopic Examination See below:    Assessment & Plan:   1. Complicated UTI (urinary tract infection) Recurrent complicated UTI with low-grade fevers.  VSS in clinic today and she is well-appearing.  We discussed that I think her staghorn stone is playing a role in her recurrent infections.  We administered 1 dose of IM Rocephin in clinic today and will transition her to 7 days of Cipro, sending urine for culture today.  Based on her frequency of complicated UTIs, I would like to start her on a low-dose prophylactic antibiotic to last her through her upcoming surgery date.  We will select a prophylactic antibiotic based on her urine culture results.  She is in agreement with this plan.  We discussed return precautions today including fever greater than 101 F or symptom persistent past 72 hours of starting antibiotics. - Urinalysis, Complete - CULTURE, URINE COMPREHENSIVE - cefTRIAXone (ROCEPHIN) injection 1 g - ciprofloxacin (CIPRO) 500 MG tablet; Take 1 tablet (500 mg total) by mouth 2 (two) times daily for 7 days.  Dispense: 14 tablet; Refill: 0  Return for Will call to start prophylactic abx per urine culture results.  Carman Ching, PA-C  [facility] [address] [place], [place] [address] [phone]

## 2021-12-25 LAB — CULTURE, URINE COMPREHENSIVE

## 2021-12-27 NOTE — Progress Notes (Addendum)
Michele Sanders Urological Surgery Posting Form   Surgery Date/Time: Date: 01/31/2022  Surgeon: Dr. Hollice Espy, MD   Surgery Location: Day Surgery  Inpt ( No  )   Outpt (No)   Obs ( Yes  )   Diagnosis:Left Nephrolithiasis  -CPT: 50080   Surgery: Left Percutaneous Nephrolithotomy  Stop Anticoagulations: Yes and hold ASA  Cardiac/Medical/Pulmonary Clearance needed: no  *Orders entered into EPIC  Date: 12/28/21   *Case booked in EPIC  Date: 12/22/2021  *Notified pt of Surgery: Date: 12/22/2021  PRE-OP UA & CX: yes, will obtain at pre-op along with CBC, BMP, INR, Type and Screen  *Placed into Prior Authorization Work [place] Date: 12/28/21  Assistant/laser/rep:No

## 2021-12-28 ENCOUNTER — Telehealth: Payer: Self-pay

## 2021-12-28 NOTE — Telephone Encounter (Signed)
I spoke with Michele Sanders. We have discussed possible surgery dates and Monday November 27th, 2023 was agreed upon by all parties. Patient given information about surgery date, what to expect pre-operatively and post operatively.  We discussed that a Pre-Admission Testing office will be calling to set up the pre-op visit that will take place prior to surgery, and that these appointments are typically done over the phone with a Pre-Admissions RN. Informed patient that our office will communicate any additional care to be provided after surgery. Patients questions or concerns were discussed during our call. Advised to call our office should there be any additional information, questions or concerns that arise. Patient verbalized understanding.

## 2021-12-29 ENCOUNTER — Telehealth: Payer: Self-pay

## 2021-12-29 MED ORDER — TRIMETHOPRIM 100 MG PO TABS: 100.0000 mg | ORAL_TABLET | Freq: Every day | ORAL | 0 refills | Status: DC

## 2021-12-29 NOTE — Telephone Encounter (Signed)
I left a message for the patient to return my call.

## 2021-12-29 NOTE — Telephone Encounter (Signed)
Patient returned call, results were given. Patient voiced understanding. RX sent to pharmacy

## 2021-12-29 NOTE — Telephone Encounter (Signed)
-----   Message from [place], [place] sent at 12/28/2021  5:07 PM EDT ----- Once she finishes Cipro, let's put her on daily suppressive trimethoprim '100mg'$  x5 weeks, to last until her surgery + a couple days.

## 2021-12-29 NOTE — Telephone Encounter (Signed)
-----   Message from [place], [place] sent at 12/28/2021  5:07 PM EDT ----- Once she finishes Cipro, let's put her on daily suppressive trimethoprim '100mg'$  x5 weeks, to last until her surgery + a couple days.

## 2022-01-21 ENCOUNTER — Encounter: Payer: Self-pay | Admitting: Urgent Care

## 2022-01-21 ENCOUNTER — Encounter
Admission: RE | Admit: 2022-01-21 | Discharge: 2022-01-21 | Disposition: A | Discharge status: Home / Self Care | Payer: Medicare HMO | Source: Ambulatory Visit | Attending: Urology | Admitting: Urology

## 2022-01-21 ENCOUNTER — Other Ambulatory Visit: Payer: Self-pay

## 2022-01-21 DIAGNOSIS — R9431 Abnormal electrocardiogram [ECG] [EKG]: Secondary | ICD-10-CM | POA: Insufficient documentation

## 2022-01-21 DIAGNOSIS — Z01818 Encounter for other preprocedural examination: Secondary | ICD-10-CM | POA: Insufficient documentation

## 2022-01-21 DIAGNOSIS — N200 Calculus of kidney: Secondary | ICD-10-CM | POA: Diagnosis not present

## 2022-01-21 DIAGNOSIS — Z01810 Encounter for preprocedural cardiovascular examination: Secondary | ICD-10-CM

## 2022-01-21 HISTORY — DX: Other specified postprocedural states: Z98.890

## 2022-01-21 HISTORY — DX: Prediabetes: R73.03

## 2022-01-21 HISTORY — DX: Pneumonia, unspecified organism: J18.9

## 2022-01-21 HISTORY — DX: Other specified postprocedural states: R11.2

## 2022-01-21 LAB — BASIC METABOLIC PANEL
Anion gap: 9 (ref 5–15)
BUN: 16 mg/dL (ref 8–23)
CO2: 26 mmol/L (ref 22–32)
Calcium: 9.4 mg/dL (ref 8.9–10.3)
Chloride: 103 mmol/L (ref 98–111)
Creatinine, Ser: 0.86 mg/dL (ref 0.44–1.00)
GFR, Estimated: >60 mL/min (ref >60)
Glucose, Bld: 126 mg/dL — ABNORMAL HIGH (ref 70–99)
Potassium: 3.8 mmol/L (ref 3.5–5.1)
Sodium: 138 mmol/L (ref 135–145)

## 2022-01-21 LAB — CBC
HCT: 43.5 % (ref 36.0–46.0)
Hemoglobin: 14.7 g/dL (ref 12.0–15.0)
MCH: 30.1 pg (ref 26.0–34.0)
MCHC: 33.8 g/dL (ref 30.0–36.0)
MCV: 89 fL (ref 80.0–100.0)
Platelets: 220 10*3/uL (ref 150–400)
RBC: 4.89 MIL/uL (ref 3.87–5.11)
RDW: 14.1 % (ref 11.5–15.5)
WBC: 11.3 10*3/uL — ABNORMAL HIGH (ref 4.0–10.5)
nRBC: 0 % (ref 0.0–0.2)

## 2022-01-21 LAB — URINALYSIS, COMPLETE (UACMP) WITH MICROSCOPIC
Bilirubin Urine: NEGATIVE
Glucose, UA: NEGATIVE mg/dL
Hgb urine dipstick: NEGATIVE
Ketones, ur: NEGATIVE mg/dL
Nitrite: NEGATIVE
Protein, ur: NEGATIVE mg/dL
Specific Gravity, Urine: 1.018 (ref 1.005–1.030)
pH: 5 (ref 5.0–8.0)

## 2022-01-21 LAB — PROTIME-INR
INR: 1 (ref 0.8–1.2)
Prothrombin Time: 12.9 seconds (ref 11.4–15.2)

## 2022-01-21 LAB — TYPE AND SCREEN
ABO/RH(D): O NEG
Antibody Screen: NEGATIVE

## 2022-01-21 NOTE — Patient Instructions (Signed)
Your procedure is scheduled on: 01/31/22 - Monday Report to the Registration Desk on the 1st floor of the [facility]. To find out your arrival time, please call [phone] between 1PM - 3PM on: 01/28/22 - Friday If your arrival time is 6:00 am, do not arrive prior to that time as the [facility] entrance doors do not open until 6:00 am.  REMEMBER: Instructions that are not followed completely may result in serious medical risk, up to and including death; or upon the discretion of your surgeon and anesthesiologist your surgery may need to be rescheduled.  Do not eat food or drink any liquids after midnight the night before surgery.  No gum chewing, lozengers or hard candies.   TAKE ONLY THESE MEDICATIONS THE MORNING OF SURGERY WITH A SIP OF WATER: - trimethoprim (TRIMPEX)  - pantoprazole (PROTONIX)    HOLD lisinopril (PRINIVIL,ZESTRIL) on the day of surgery/  HOLD Aspirin 5 days prior to your surgery beginning 01/26/22.   One week prior to surgery HOLD beginning 01/24/22:  Stop Anti-inflammatories (NSAIDS) such as Advil, Aleve, Ibuprofen, Motrin, Naproxen, Naprosyn and Aspirin based products such as Excedrin, Goodys Powder, BC Powder.  Stop ANY OVER THE COUNTER supplements until after surgery.  You may however, continue to take Tylenol if needed for pain up until the day of surgery.  No Alcohol for 24 hours before or after surgery.  No Smoking including e-cigarettes for 24 hours prior to surgery.  No chewable tobacco products for at least 6 hours prior to surgery.  No nicotine patches on the day of surgery.  Do not use any "recreational" drugs for at least a week prior to your surgery.  Please be advised that the combination of cocaine and anesthesia may have negative outcomes, up to and including death. If you test positive for cocaine, your surgery will be cancelled.  On the morning of surgery brush your teeth with toothpaste and water, you may rinse your mouth with  mouthwash if you wish, Do not swallow any toothpaste or mouthwash.  Do not wear jewelry, make-up, hairpins, clips or nail polish.  Do not wear lotions, powders, or perfumes.   Do not shave body from the neck down 48 hours prior to surgery just in case you cut yourself which could leave a site for infection. Also, freshly shaved skin may become irritated if using the CHG soap.  Contact lenses, hearing aids and dentures may not be worn into surgery.  Do not bring valuables to the hospital. [facility] is not responsible for any missing/lost belongings or valuables.   Notify your doctor if there is any change in your medical condition (cold, fever, infection).  Wear comfortable clothing (specific to your surgery type) to the hospital.  After surgery, you can help prevent lung complications by doing breathing exercises.  Take deep breaths and cough every 1-2 hours. Your doctor may order a device called an Incentive Spirometer to help you take deep breaths. When coughing or sneezing, hold a pillow firmly against your incision with both hands. This is called "splinting." Doing this helps protect your incision. It also decreases belly discomfort.  If you are being admitted to the hospital overnight, leave your suitcase in the car. After surgery it may be brought to your room.  If you are being discharged the day of surgery, you will not be allowed to drive home. You will need a responsible adult (18 years or older) to drive you home and stay with you that night.  If you are taking public transportation, you will need to have a responsible adult (18 years or older) with you. Please confirm with your physician that it is acceptable to use public transportation.   Please call the [place] Dept. at [phone] if you have any questions about these instructions.  Surgery Visitation Policy:  Patients undergoing a surgery or procedure may have two family members or support  persons with them as long as the person is not COVID-19 positive or experiencing its symptoms.   Inpatient Visitation:    Visiting hours are 7 a.m. to 8 p.m. Up to four visitors are allowed at one time in a patient room. The visitors may rotate out with other people during the day. One designated support person (adult) may remain overnight.  MASKING: Due to an increase in RSV rates and hospitalizations, starting Wednesday, Nov. 15, in patient care areas in which we serve newborns, infants and children, masks will be required for teammates and visitors.  Children ages 37 and under may not visit. This policy affects the following departments only:  [place] Postpartum area Mother Baby Unit Newborn nursery/Special care nursery  Other areas: Masks continue to be strongly recommended for [place] teammates, visitors and patients in all other areas. Visitation is not restricted outside of the units listed above.

## 2022-01-22 LAB — URINE CULTURE

## 2022-01-26 ENCOUNTER — Other Ambulatory Visit: Payer: Self-pay | Admitting: Family Medicine

## 2022-01-26 ENCOUNTER — Other Ambulatory Visit: Payer: Self-pay | Admitting: Urology

## 2022-01-26 ENCOUNTER — Encounter: Payer: Self-pay | Admitting: Urology

## 2022-01-26 DIAGNOSIS — N200 Calculus of kidney: Secondary | ICD-10-CM

## 2022-01-28 ENCOUNTER — Other Ambulatory Visit: Payer: Self-pay | Admitting: Internal Medicine

## 2022-01-28 DIAGNOSIS — N200 Calculus of kidney: Secondary | ICD-10-CM

## 2022-01-30 MED ORDER — CHLORHEXIDINE GLUCONATE 0.12 % MT SOLN: 15.0000 mL | Freq: Once | OROMUCOSAL | Status: AC

## 2022-01-30 MED ORDER — CEFAZOLIN SODIUM-DEXTROSE 2-4 GM/100ML-% IV SOLN: 2.0000 g | INTRAVENOUS | Status: DC

## 2022-01-30 MED ORDER — ORAL CARE MOUTH RINSE: 15.0000 mL | Freq: Once | OROMUCOSAL | Status: AC

## 2022-01-30 MED ORDER — LACTATED RINGERS IV SOLN: INTRAVENOUS | Status: DC

## 2022-01-30 MED ORDER — SODIUM CHLORIDE 0.9 % IV SOLN
2.0000 g | INTRAVENOUS | Status: DC
  Filled 2022-01-30: qty 20

## 2022-01-30 NOTE — H&P (Signed)
Chief Complaint: Left sided stag horn calculi. Request is for left sided nephrostomy tube for access for nephrolithotomy   Referring Physician(s): Hollice Espy  Supervising Physician: Juliet Rude  Patient Status: [facility] - Out-pt  History of Present Illness: Michele Sanders is a 66 y.o. female outpatient. History of  HTN, GERD, left partial stag horn calculi s/p partial ureteroscopy in 2022. Presented to the [facility]  associates with   left sided flank pain and fever. Found to have left sided stag horn calculi. CT Renal from 9.8.23 reads  Multiple large stones at the lower pole of the left kidney measuring up to 17 mm. Team is requesting left sided nephrostomy tube placement for nephrolithotomy access. Surgery scheduled for 11.27.23 with Dr. Camillia Herter.   Currently without any significant complaints. Patient alert and laying in bed,calm. Denies any fevers, headache, chest pain, SOB, cough, abdominal pain, nausea, vomiting or bleeding. Return precautions and treatment recommendations and follow-up discussed with the patient who is agreeable with the plan.    Past Medical History:  Diagnosis Date   Arthritis    GERD (gastroesophageal reflux disease)    History of kidney stones    Hyperparathyroidism (HCC)    Hypertension    Pneumonia    PONV (postoperative nausea and vomiting)    Pre-diabetes     Past Surgical History:  Procedure Laterality Date   APPENDECTOMY     BACK SURGERY     L4-L5   BREAST BIOPSY Left 04/09/2019   neg   BREAST CYST ASPIRATION Left 04/08/2019   neg   COLONOSCOPY W/ POLYPECTOMY     CYSTOSCOPY/URETEROSCOPY/HOLMIUM LASER/STENT PLACEMENT Left 09/29/2020   Procedure: CYSTOSCOPY/URETEROSCOPY, /STENT PLACEMENT;  Surgeon: Abbie Sons, MD;  Location: [facility] ORS;  Service: Urology;  Laterality: Left;   CYSTOSCOPY/URETEROSCOPY/HOLMIUM LASER/STENT PLACEMENT Left 11/10/2020   Procedure: CYSTOSCOPY/URETEROSCOPY/HOLMIUM LASER/STENT PLACEMENT;  Surgeon:  Abbie Sons, MD;  Location: [facility] ORS;  Service: Urology;  Laterality: Left;   FRACTURE SURGERY Right    humerus 2005    Allergies: Patient has no known allergies.  Medications: Prior to Admission medications   Medication Sig Start Date End Date Taking? Authorizing Provider  acetaminophen (TYLENOL) 500 MG tablet Take 1,000 mg by mouth every 6 (six) hours as needed.    [provider]  aspirin 81 MG chewable tablet Chew 81 mg by mouth daily.    [provider]  cholecalciferol (VITAMIN D3) 25 MCG (1000 UNIT) tablet Take 1,000 Units by mouth daily.    [provider]  ibuprofen (ADVIL) 200 MG tablet Take 200 mg by mouth every 6 (six) hours as needed. 2 tablets    [provider]  lisinopril (PRINIVIL,ZESTRIL) 10 MG tablet Take 10 mg by mouth every morning. 10/17/17   [provider]  pantoprazole (PROTONIX) 40 MG tablet Take 40 mg by mouth daily.    [provider]  simvastatin (ZOCOR) 40 MG tablet Take 40 mg by mouth daily at 6 PM.    [provider]  trimethoprim (TRIMPEX) 100 MG tablet Take 1 tablet (100 mg total) by mouth daily. 12/29/21   Debroah Loop, PA-C     Family History  Problem Relation Age of Onset   Breast cancer Neg Hx     Social History   Socioeconomic History   Marital status: Married    Spouse name: Belenda Cruise   Number of children: Not on file   Years of education: Not on file   Highest education level: Not on file  Occupational History   Not on file  Tobacco Use   Smoking status: Every Day    Packs/day: 0.50    Years: 30.00    Total pack years: 15.00    Types: Cigarettes   Smokeless tobacco: Never  Vaping Use   Vaping Use: Never used  Substance and Sexual Activity   Alcohol use: Yes    Comment: wine nightly   Drug use: Never   Sexual activity: Not on file  Other Topics Concern   Not on file  Social History Narrative   Not on file   Social Determinants of Health    Financial Resource Strain: Not on file  Food Insecurity: Not on file  Transportation Needs: Not on file  Physical Activity: Not on file  Stress: Not on file  Social Connections: Not on file     Review of Systems: A 12 point ROS discussed and pertinent positives are indicated in the HPI above.  All other systems are negative.  Review of Systems  Constitutional:  Negative for fatigue and fever.  HENT:  Negative for congestion.   Respiratory:  Negative for cough and shortness of breath.   Gastrointestinal:  Negative for abdominal pain, diarrhea, nausea and vomiting.    Vital Signs: See procedure notes.    Physical Exam Vitals and nursing note reviewed.  Constitutional:      Appearance: She is well-developed.  HENT:     Head: Normocephalic and atraumatic.  Eyes:     Conjunctiva/sclera: Conjunctivae normal.  Cardiovascular:     Rate and Rhythm: Normal rate and regular rhythm.     Heart sounds: Normal heart sounds.  Pulmonary:     Effort: Pulmonary effort is normal.     Breath sounds: Normal breath sounds.  Musculoskeletal:        General: Normal range of motion.     Cervical back: Normal range of motion.  Skin:    General: Skin is warm.  Neurological:     Mental Status: She is alert and oriented to person, place, and time.     Imaging: No results found.  Labs:  CBC: Recent Labs    01/21/22 1336 01/31/22 0712  WBC 11.3* 10.3  HGB 14.7 14.4  HCT 43.5 43.1  PLT 220 280    COAGS: Recent Labs    01/21/22 1336 01/31/22 0712  INR 1.0 1.0    BMP: Recent Labs    01/21/22 1336 01/31/22 0712  NA 138 139  K 3.8 4.1  CL 103 108  CO2 26 23  GLUCOSE 126* 121*  BUN 16 18  CALCIUM 9.4 9.2  CREATININE 0.86 0.71  GFRNONAA >60 >60    Assessment and Plan:  66 y.o. female outpatient. History of  HTN, GERD, left partial stag horn calculi s/p partial ureteroscopy in 2022. Presented to the [facility]  associates with   left sided flank pain and  fever. Found to have left sided stag horn calculi. CT Renal from 9.8.23 reads  Multiple large stones at the lower pole of the left kidney measuring up to 17 mm. Team is requesting left sided nephrostomy tube placement for nephrolithotomy access. Surgery scheduled for 11.27.23 with Dr. Camillia Herter.   Labs from 11.17.23 shows WBC 11.3. Patient is on 81 mg of ASA. Last taken  . Patient is on prophylactic daily trimethoprim. NKDA. Patient has been NPO since midnight.   Risks and benefits of left sided PCN placement was discussed with the patient including, but not limited to, infection,  bleeding, significant bleeding causing loss or decrease in renal function or damage to adjacent structures.   All of the patient's questions were answered, patient is agreeable to proceed.  Consent signed and in chart.    Thank you for this interesting consult.  I greatly enjoyed meeting Michele Sanders and look forward to participating in their care.  A copy of this report was sent to the requesting provider on this date.  Electronically Signed: Jacqualine Mau, NP 01/31/2022, 8:12 AM   I spent a total of  30 Minutes  in face to face in clinical consultation, greater than 50% of which was counseling/coordinating care for left sided nephrostomy tube placement

## 2022-01-31 ENCOUNTER — Encounter: Payer: Self-pay | Admitting: Anesthesiology

## 2022-01-31 ENCOUNTER — Other Ambulatory Visit: Payer: Self-pay

## 2022-01-31 ENCOUNTER — Encounter
Admission: RE | Disposition: A | Discharge status: Home / Self Care | Payer: Self-pay | Source: Home / Self Care | Attending: Urology

## 2022-01-31 ENCOUNTER — Encounter: Payer: Self-pay | Admitting: Urology

## 2022-01-31 ENCOUNTER — Ambulatory Visit
Admission: RE | Admit: 2022-01-31 | Discharge: 2022-01-31 | Disposition: A | Discharge status: Home / Self Care | Payer: Medicare HMO | Source: Ambulatory Visit | Attending: Urology | Admitting: Urology

## 2022-01-31 ENCOUNTER — Ambulatory Visit
Admission: RE | Admit: 2022-01-31 | Discharge: 2022-01-31 | Disposition: A | Discharge status: Home / Self Care | Payer: Medicare HMO | Attending: Urology | Admitting: Urology

## 2022-01-31 DIAGNOSIS — F17210 Nicotine dependence, cigarettes, uncomplicated: Secondary | ICD-10-CM | POA: Insufficient documentation

## 2022-01-31 DIAGNOSIS — K219 Gastro-esophageal reflux disease without esophagitis: Secondary | ICD-10-CM | POA: Insufficient documentation

## 2022-01-31 DIAGNOSIS — N12 Tubulo-interstitial nephritis, not specified as acute or chronic: Secondary | ICD-10-CM | POA: Diagnosis not present

## 2022-01-31 DIAGNOSIS — N200 Calculus of kidney: Secondary | ICD-10-CM

## 2022-01-31 DIAGNOSIS — E213 Hyperparathyroidism, unspecified: Secondary | ICD-10-CM | POA: Diagnosis not present

## 2022-01-31 DIAGNOSIS — I10 Essential (primary) hypertension: Secondary | ICD-10-CM | POA: Insufficient documentation

## 2022-01-31 HISTORY — PX: IR URETERAL STENT LEFT NEW ACCESS W/O SEP NEPHROSTOMY CATH: IMG6075

## 2022-01-31 LAB — CBC WITH DIFFERENTIAL/PLATELET
Abs Immature Granulocytes: 0.04 10*3/uL (ref 0.00–0.07)
Basophils Absolute: 0.1 10*3/uL (ref 0.0–0.1)
Basophils Relative: 1 %
Eosinophils Absolute: 0.2 10*3/uL (ref 0.0–0.5)
Eosinophils Relative: 2 %
HCT: 43.1 % (ref 36.0–46.0)
Hemoglobin: 14.4 g/dL (ref 12.0–15.0)
Immature Granulocytes: 0 %
Lymphocytes Relative: 33 %
Lymphs Abs: 3.4 10*3/uL (ref 0.7–4.0)
MCH: 29.9 pg (ref 26.0–34.0)
MCHC: 33.4 g/dL (ref 30.0–36.0)
MCV: 89.6 fL (ref 80.0–100.0)
Monocytes Absolute: 0.9 10*3/uL (ref 0.1–1.0)
Monocytes Relative: 9 %
Neutro Abs: 5.6 10*3/uL (ref 1.7–7.7)
Neutrophils Relative %: 55 %
Platelets: 280 10*3/uL (ref 150–400)
RBC: 4.81 MIL/uL (ref 3.87–5.11)
RDW: 14.1 % (ref 11.5–15.5)
WBC: 10.3 10*3/uL (ref 4.0–10.5)
nRBC: 0 % (ref 0.0–0.2)

## 2022-01-31 LAB — PROTIME-INR
INR: 1 (ref 0.8–1.2)
Prothrombin Time: 13.1 seconds (ref 11.4–15.2)

## 2022-01-31 LAB — BASIC METABOLIC PANEL
Anion gap: 8 (ref 5–15)
BUN: 18 mg/dL (ref 8–23)
CO2: 23 mmol/L (ref 22–32)
Calcium: 9.2 mg/dL (ref 8.9–10.3)
Chloride: 108 mmol/L (ref 98–111)
Creatinine, Ser: 0.71 mg/dL (ref 0.44–1.00)
GFR, Estimated: >60 mL/min (ref >60)
Glucose, Bld: 121 mg/dL — ABNORMAL HIGH (ref 70–99)
Potassium: 4.1 mmol/L (ref 3.5–5.1)
Sodium: 139 mmol/L (ref 135–145)

## 2022-01-31 LAB — ABO/RH: ABO/RH(D): O NEG

## 2022-01-31 SURGERY — NEPHROLITHOTOMY PERCUTANEOUS
Anesthesia: General | Laterality: Left

## 2022-01-31 MED ORDER — MIDAZOLAM HCL 2 MG/2ML IJ SOLN
INTRAMUSCULAR | Status: DC | PRN
  Administered 2022-01-31: 1 mg via INTRAVENOUS

## 2022-01-31 MED ORDER — IOHEXOL 300 MG/ML  SOLN
16.0000 mL | Freq: Once | INTRAMUSCULAR | Status: AC | PRN
  Administered 2022-01-31: 16 mL

## 2022-01-31 MED ORDER — FENTANYL CITRATE (PF) 100 MCG/2ML IJ SOLN
INTRAMUSCULAR | Status: AC
  Filled 2022-01-31: qty 2

## 2022-01-31 MED ORDER — HYDROCODONE-ACETAMINOPHEN 5-325 MG PO TABS: 1.0000 | ORAL_TABLET | Freq: Four times a day (QID) | ORAL | 0 refills | Status: DC | PRN

## 2022-01-31 MED ORDER — SODIUM CHLORIDE 0.9 % IV SOLN
INTRAVENOUS | Status: DC | PRN
  Administered 2022-01-31: 2 g via INTRAVENOUS

## 2022-01-31 MED ORDER — MIDAZOLAM HCL 5 MG/5ML IJ SOLN
INTRAMUSCULAR | Status: DC | PRN
  Administered 2022-01-31: 1 mg via INTRAVENOUS

## 2022-01-31 MED ORDER — MIDAZOLAM HCL 2 MG/2ML IJ SOLN
INTRAMUSCULAR | Status: AC
  Filled 2022-01-31: qty 2

## 2022-01-31 MED ORDER — CEFTRIAXONE SODIUM 1 G IJ SOLR: 1.0000 g | Freq: Once | INTRAMUSCULAR | Status: DC

## 2022-01-31 MED ORDER — FENTANYL CITRATE (PF) 100 MCG/2ML IJ SOLN
INTRAMUSCULAR | Status: DC | PRN
  Administered 2022-01-31 (×3): 50 ug via INTRAVENOUS

## 2022-01-31 MED ORDER — SODIUM CHLORIDE 0.9 % IV SOLN
2.0000 g | Freq: Once | INTRAVENOUS | Status: DC
  Filled 2022-01-31: qty 20

## 2022-01-31 MED ORDER — DOCUSATE SODIUM 100 MG PO CAPS: 100.0000 mg | ORAL_CAPSULE | Freq: Every day | ORAL | 2 refills | Status: AC | PRN

## 2022-01-31 MED ORDER — CEFAZOLIN SODIUM-DEXTROSE 2-4 GM/100ML-% IV SOLN
INTRAVENOUS | Status: AC
  Filled 2022-01-31: qty 100

## 2022-01-31 MED ORDER — SODIUM CHLORIDE 0.9 % IV SOLN: INTRAVENOUS | Status: DC

## 2022-01-31 MED ORDER — TRIMETHOPRIM 100 MG PO TABS: 100.0000 mg | ORAL_TABLET | Freq: Every day | ORAL | 1 refills | Status: DC

## 2022-01-31 MED ORDER — CHLORHEXIDINE GLUCONATE 0.12 % MT SOLN
OROMUCOSAL | Status: AC
  Administered 2022-01-31: 15 mL via OROMUCOSAL
  Filled 2022-01-31: qty 15

## 2022-01-31 MED ORDER — LIDOCAINE HCL 1 % IJ SOLN
INTRAMUSCULAR | Status: AC
  Administered 2022-01-31: 10 mL
  Filled 2022-01-31: qty 20

## 2022-01-31 SURGICAL SUPPLY — 64 items
ADAPTER IRRIG TUBE 2 SPIKE SOL (ADAPTER) ×2 IMPLANT
ADAPTER SCOPE UROLOK II (MISCELLANEOUS) ×1 IMPLANT
BAG URINE DRAIN 2000ML AR STRL (UROLOGICAL SUPPLIES) ×1 IMPLANT
BALLN NEPHROSTOMY 10X15 (UROLOGICAL SUPPLIES) ×1 IMPLANT
BASKET ZERO TIP 1.9FR (BASKET) IMPLANT
BLADE SURG 15 STRL LF DISP TIS (BLADE) ×1 IMPLANT
BLADE SURG 15 STRL SS (BLADE) ×1
CATH COUNCIL 22FR (CATHETERS) IMPLANT
CATH FOLEY 2W COUNCIL 20FR 5CC (CATHETERS) IMPLANT
CATH FOLEY 2W COUNCIL 5CC 18FR (CATHETERS) IMPLANT
CATH STENT KAYE NEPHR TAMP (CATHETERS) IMPLANT
CATH URET FLEX-TIP 2 LUMEN 10F (CATHETERS) IMPLANT
CATH URETL OPEN 5X70 (CATHETERS) ×1 IMPLANT
CATH/STENT KAYE NEPHR TAMP (CATHETERS)
CHLORAPREP W/TINT 26 (MISCELLANEOUS) ×1 IMPLANT
CNTNR SPEC 2.5X3XGRAD LEK (MISCELLANEOUS) ×1
CONT SPEC 4OZ STRL OR WHT (MISCELLANEOUS) ×1
CONTAINER SPEC 2.5X3XGRAD LEK (MISCELLANEOUS) ×1 IMPLANT
COVER EZ STRL 42X30 (DRAPES) ×1 IMPLANT
DRAPE 3/4 80X56 (DRAPES) ×1 IMPLANT
DRAPE C-ARM XRAY 36X54 (DRAPES) ×1 IMPLANT
DRAPE SURG 17X11 SM STRL (DRAPES) ×4 IMPLANT
GAUZE 4X4 16PLY ~~LOC~~+RFID DBL (SPONGE) ×1 IMPLANT
GAUZE SPONGE 4X4 12PLY STRL (GAUZE/BANDAGES/DRESSINGS) ×1 IMPLANT
GLIDEWIRE STIFF .35X180X3 HYDR (WIRE) IMPLANT
GLOVE BIO SURGEON STRL SZ 6.5 (GLOVE) ×2 IMPLANT
GLOVE BIOGEL PI IND STRL 6.5 (GLOVE) ×2 IMPLANT
GOWN STRL REUS W/ TWL LRG LVL3 (GOWN DISPOSABLE) ×2 IMPLANT
GOWN STRL REUS W/TWL LRG LVL3 (GOWN DISPOSABLE) ×2
GUIDEWIRE GREEN .038 145CM (MISCELLANEOUS) ×1 IMPLANT
GUIDEWIRE INTRO SET STRAIGHT (WIRE) ×1 IMPLANT
GUIDEWIRE STR DUAL SENSOR (WIRE) ×1 IMPLANT
GUIDEWIRE STR ZIPWIRE 035X150 (MISCELLANEOUS) IMPLANT
GUIDEWIRE SUPER STIFF (WIRE) ×1 IMPLANT
HOLDER FOLEY CATH W/STRAP (MISCELLANEOUS) ×1 IMPLANT
IV NS IRRIG 3000ML ARTHROMATIC (IV SOLUTION) ×4 IMPLANT
KIT PROBE TRILOGY 3.9X350 (MISCELLANEOUS) ×1 IMPLANT
MANIFOLD NEPTUNE II (INSTRUMENTS) ×2 IMPLANT
MAT ABSORB  FLUID 56X50 GRAY (MISCELLANEOUS) ×2
MAT ABSORB FLUID 56X50 GRAY (MISCELLANEOUS) ×2 IMPLANT
NDL FASCIA INCISION 18GA (NEEDLE) ×1 IMPLANT
PACK BASIN MINOR ARMC (MISCELLANEOUS) ×1 IMPLANT
PAD ABD DERMACEA PRESS 5X9 (GAUZE/BANDAGES/DRESSINGS) ×2 IMPLANT
PAD PREP 24X41 OB/GYN DISP (PERSONAL CARE ITEMS) ×1 IMPLANT
SET IRRIGATING DISP (SET/KITS/TRAYS/PACK) ×1 IMPLANT
SHEET NEURO XL SOL CTL (MISCELLANEOUS) ×1 IMPLANT
SPONGE DRAIN TRACH 4X4 STRL 2S (GAUZE/BANDAGES/DRESSINGS) ×1 IMPLANT
STENT URET 6FRX24 CONTOUR (STENTS) IMPLANT
STENT URET 6FRX26 CONTOUR (STENTS) IMPLANT
STRAP SAFETY 5IN WIDE (MISCELLANEOUS) ×2 IMPLANT
SURGILUBE 2OZ TUBE FLIPTOP (MISCELLANEOUS) ×1 IMPLANT
SUT SILK 0 SH 30 (SUTURE) ×1 IMPLANT
SYR 10ML LL (SYRINGE) ×1 IMPLANT
SYR 20ML LL LF (SYRINGE) ×1 IMPLANT
SYR 30ML LL (SYRINGE) ×1 IMPLANT
SYR TOOMEY IRRIG 70ML (MISCELLANEOUS) ×1
SYRINGE TOOMEY IRRIG 70ML (MISCELLANEOUS) ×1 IMPLANT
TAPE CLOTH 3X10 WHT NS LF (GAUZE/BANDAGES/DRESSINGS) ×1 IMPLANT
TAPE MICROFOAM 4IN (TAPE) ×1 IMPLANT
TRAP FLUID SMOKE EVACUATOR (MISCELLANEOUS) ×1 IMPLANT
TRAY FOLEY MTR SLVR 16FR STAT (SET/KITS/TRAYS/PACK) ×1 IMPLANT
TUBING CONNECTING 10 (TUBING) ×1 IMPLANT
WATER STERILE IRR 1000ML POUR (IV SOLUTION) ×1 IMPLANT
WATER STERILE IRR 500ML POUR (IV SOLUTION) ×1 IMPLANT

## 2022-01-31 NOTE — Discharge Instructions (Signed)

## 2022-01-31 NOTE — Progress Notes (Signed)
Pt arrived via stretcher from IR awake and Ox4, VSS denies pain at this time

## 2022-01-31 NOTE — H&P (Signed)
01/31/22 RRR CTAB  Treated for recent UTI 12/28/21 then placed on suppression   Michele Sanders May 05, 1955 [id number]   Referring provider: Patrice Paradise, MD [address] [address] [place] [place] [facility] [place]  [place] [address]       Chief Complaint  Patient presents with   Follow-up      Discuss PCNL      HPI: 66 year old female who presents today to discuss possible left PCNL.   She has a personal history of nephrolithiasis and underwent ureteroscopy x2 last year for left-sided stone burden.  This is a Dr. Lonna Cobb.  Upon review of operative report, it appears there was some significant angulation as well as infundibular stenosis into the lower pole some of the stone of which was fragmented with some was not able to be addressed.  She had a hard time tolerating the stent with significant urge incontinence.   More recently, she developed what is likely left pyelonephritis.  She been treated with a course of antibiotics and started feel better.  Urine culture ended up growing Klebsiella and she was treated with IM Rocephin and transition to additional 10 days of Bactrim.   As part of the work-up for this, she had another CT abdomen pelvis on 11/22/2021 which showed fairly significant stone burden primarily in the left lower pole up to 17 mm with surrounding inflammatory changes as well as presence of hydronephrosis.  There is no obvious ureteral calculi on the study.   Notably, she does have a personal history of elevated parathyroid hormone is now status post parathyroidectomy.   Clinically she is feeling better.  No flank pain.     PMH:     Past Medical History:  Diagnosis Date   Arthritis     GERD (gastroesophageal reflux disease)     History of kidney stones     Hyperparathyroidism (HCC)     Hypertension        Surgical History:      Past Surgical History:  Procedure Laterality Date   APPENDECTOMY       BACK SURGERY        L4-L5   BREAST BIOPSY Left 04/09/2019     neg   BREAST CYST ASPIRATION Left 04/08/2019    neg   COLONOSCOPY W/ POLYPECTOMY       CYSTOSCOPY/URETEROSCOPY/HOLMIUM LASER/STENT PLACEMENT Left 09/29/2020    Procedure: CYSTOSCOPY/URETEROSCOPY, /STENT PLACEMENT;  Surgeon: Riki Altes, MD;  Location: [facility] OR[facility];  Service: Urology;  Laterality: Left;   CYSTOSCOPY/URETEROSCOPY/HOLMIUM LASER/STENT PLACEMENT Left 11/10/2020    Procedure: CYSTOSCOPY/URETEROSCOPY/HOLMIUM LASER/STENT PLACEMENT;  Surgeon: Riki Altes, MD;  Location: [facility];  Service: Urology;  Laterality: Left;   FRACTURE SURGERY Right      humerus 2005      Home Medications:  Allergies as of 12/01/2021   No Known Allergies         Medication List           Accurate as of December 01, 2021 11:01 AM. If you have any questions, ask your nurse or doctor.              STOP taking these medications     Baclofen 5 MG Tabs    Melatonin 10 MG Tbcr           TAKE these medications     aspirin 81 MG chewable tablet Chew 81 mg by mouth daily.    lisinopril 10 MG tablet Commonly known as: ZESTRIL Take 10 mg by mouth every  morning.    OVER THE COUNTER MEDICATION Take 10 mg by mouth daily. CBD oil    simvastatin 40 MG tablet Commonly known as: ZOCOR Take 40 mg by mouth daily at 6 PM.             Allergies: No Known Allergies   Family History:      Family History  Problem Relation Age of Onset   Breast cancer Neg Hx        Social History:  reports that she has been smoking cigarettes. She has a 15.00 pack-year smoking history. She has never used smokeless tobacco. She reports current alcohol use. She reports that she does not use drugs.     Physical Exam: BP (!) 141/103   Pulse 88   Ht 5\' 4"  (1.626 m)   Wt 188 lb (85.3 kg)   BMI 32.27 kg/m   Constitutional:  Alert and oriented, No acute distress. HEENT: [place] AT, moist mucus membranes.  Trachea midline, no masses. Neurologic: Grossly intact, no focal deficits, moving all 4  extremities. Psychiatric: Normal mood and affect.   Laboratory Data: Recent Labs       Lab Results  Component Value Date    WBC 14.4 (H) 02/13/2018    HGB 15.2 (H) 02/13/2018    HCT 45.2 02/13/2018    MCV 92.8 02/13/2018    PLT 310 02/13/2018        Recent Labs       Lab Results  Component Value Date    CREATININE 0.58 02/13/2018        Urinalysis      Results for orders placed or performed in visit on 12/01/21  Microscopic Examination    Urine  Result Value Ref Range    WBC, UA >30 (A) 0 - 5 /hpf    RBC, Urine 11-30 (A) 0 - 2 /hpf    Epithelial Cells (non renal) 0-10 0 - 10 /hpf    Bacteria, UA Many (A) None seen/Few  Urinalysis, Complete  Result Value Ref Range    Specific Gravity, UA 1.020 1.005 - 1.030    pH, UA 5.5 5.0 - 7.5    Color, UA Yellow Yellow    Appearance Ur Hazy (A) Clear    Leukocytes,UA 2+ (A) Negative    Protein,UA 1+ (A) Negative/Trace    Glucose, UA Negative Negative    Ketones, UA Negative Negative    RBC, UA 2+ (A) Negative    Bilirubin, UA Negative Negative    Urobilinogen, Ur 0.2 0.2 - 1.0 mg/dL    Nitrite, UA Negative Negative    Microscopic Examination See below:            Pertinent Imaging:   CT RENAL STONE STUDY   Narrative CLINICAL DATA:  Flank pain.  Kidney stone suspected.   EXAM: CT ABDOMEN AND PELVIS WITHOUT CONTRAST   TECHNIQUE: Multidetector CT imaging of the abdomen and pelvis was performed following the standard protocol without IV contrast.   RADIATION DOSE REDUCTION: This exam was performed according to the departmental dose-optimization program which includes automated exposure control, adjustment of the mA and/or kV according to patient size and/or use of iterative reconstruction technique.   COMPARISON:  One-view abdomen 11/12/2021. CT abdomen without contrast 04/06/2020   FINDINGS: Lower chest: The lung bases are clear without focal nodule, mass, or airspace disease. Heart size is normal. No  significant pleural or pericardial effusion is present.   Hepatobiliary: Fatty infiltration of the liver is again seen.  No discrete lesion is present. Common bile duct and gallbladder are within normal limits.   Pancreas: Unremarkable. No pancreatic ductal dilatation or surrounding inflammatory changes.   Spleen: Normal in size without focal abnormality.   Adrenals/Urinary Tract: Adrenal glands are normal bilaterally. Water density lesion projecting laterally from the right kidney has increased in size, now measuring 5.2 cm. No follow-up imaging is recommended. The right kidney is otherwise unremarkable.   Multiple large stones are present at the lower pole of the left kidney. Largest stone measures 17 mm. Inflammatory changes are present about the left kidney. Punctate nonobstructing stone is present at the upper pole of the left kidney. Hydronephrosis is present. Left ureter is not dilated. Distal left ureter is within normal limits to the urinary bladder. Urinary bladder is unremarkable.   Stomach/Bowel: The stomach and duodenum are within normal limits. Small bowel is unremarkable. Terminal ileum is within normal limits. Appendix is surgically absent. Diverticular changes are present in the distal ascending colon and throughout the descending and sigmoid colon without focal inflammation to suggest diverticulitis.   Vascular/Lymphatic: Atherosclerotic calcifications are present in the aorta and branch vessels. No aneurysm is present. No significant adenopathy is present.   Reproductive: Calcified uterine fibroid is stable. Uterus and adnexa are otherwise unremarkable.   Other: No abdominal wall hernia or abnormality. No abdominopelvic ascites.   Musculoskeletal: Degenerative changes are present in the lumbar spine. No focal osseous lesions are present. Bony pelvis is within normal limits. The hips are located and within normal limits bilaterally.   IMPRESSION: 1.  Multiple large stones at the lower pole of the left kidney measuring up to 17 mm. 2. Inflammatory changes about the left kidney suggesting pyelonephritis in the setting of abnormal urinalysis. 3. Punctate nonobstructing stone at the upper pole of the left kidney. 4. Hepatic steatosis. 5. Stable calcified uterine fibroid. 6. Aortic Atherosclerosis (ICD10-I70.0).     Electronically Signed By: Marin Roberts M.D. On: 11/12/2021 12:43   The above CT scan was personally reviewed and I also was able to bring up the images and shared them with the patient as well.  There is evidence of what looks like layering within a dilated left lower pole calyx consistent with obstructed calyx.  Suspect this likely is related to previous partial fragmentation within a hydronephrotic calyx from infundibular stenosis.  There is also additional stones within that same compound calyx adjacent to this.     Assessment & Plan:     1. Nephrolithiasis Primarily left lower pole stone burden within fairly complex anatomy with left lower pole infundibular stenosis and a dilated lower pole compound calyx.  Unfortunately, ureteroscopic manipulation secondary to this particular anatomic variant has its constraints.  I reviewed the patient's particular anatomy with her as well as drew her pictures to help her understand.   We discussed various options including expectant management/observation and the risk and benefits of this which could include recurrent infections or cortical loss over time.  Alternatively, we discussed the option of percutaneous nephrolithotomy.  We discussed the procedure at length which involves interventional radiology localization into the dilated calyx, renal dilation and endoscopic removal of her stone burden.  Discussed the anatomic concerns as well given the infundibular stenosis, angulation of the additional stones as relates to the primary dilated calyx which would likely preclude  nephroscopic removal.  Potentially, antegrade ureteroscopy may be possible to treat this additional stone burden but could not be guaranteed.  She understands that there is a very high  risk of eventually needing a ureteral stent placed in antegrade fashion as well as the need for possible staged retrograde ureteroscopy to treat and clear her of all residual stone burden based on her anatomic complexity.  We discussed the additional risk of bleeding, infection, damage surrounding structures, urine leak which is expected for period of time, possible need for blood transfusion around 20% in these cases, overnight admission at minimum amongst others.  All of her questions were answered.  Alternatively, could consider another attempt at retrograde treatment of this stone burden although this has been attempted and previously unsuccessful although there is some evidence that there has been some fragmentation of the left lower pole stone.   Ultimately, after discussing all of the risk and benefits, she is unsure how she would like to proceed.  She is interested in knowing the cost differential for these procedures would like to run the CPT code by her insurance company as well as look at her deductible and think about timing for this.  In addition, she would like her daughter to be involved in the decision making.  She was post to come today but is unable to do so.  She is interested in a second virtual visit to include her daughter which is quite reasonable.   Lastly, she mentions that she tolerated the stent very poorly in the past.  We will likely start her on a beta 3 agonist at least a week before the procedure to help her tolerate this better with as needed oxybutynin as a strategy to facilitate this and make the stent more tolerable.   2. Pyelonephritis of left kidney UA remains somewhat suspicious but she is otherwise asymptomatic today   We will repeat urine culture and treat as needed - Urinalysis,  Complete - CULTURE, URINE COMPREHENSIVE   3. Hyperparathyroidism (HCC) Contributing factor to stone burden         Vanna Scotland, MD   [facility] [address], [address] [place], [place] [address] [phone]   I spent 42 total minutes on the day of the encounter including pre-visit review of the medical record, face-to-face time with the patient, and post visit ordering of labs/imaging/tests.

## 2022-01-31 NOTE — Anesthesia Preprocedure Evaluation (Deleted)
Anesthesia Evaluation  Patient identified by MRN, date of birth, ID band Patient awake    Reviewed: Allergy & Precautions, NPO status , Patient's Chart, lab work & pertinent test results  Airway Mallampati: III  TM Distance: >3 FB Neck ROM: full    Dental no notable dental hx.    Pulmonary Current Smoker and Patient abstained from smoking.   Pulmonary exam normal        Cardiovascular hypertension, Pt. on medications Normal cardiovascular exam     Neuro/Psych negative neurological ROS  negative psych ROS   GI/Hepatic Neg liver ROS,GERD  Medicated,,  Endo/Other  negative endocrine ROS    Renal/GU      Musculoskeletal   Abdominal   Peds  Hematology negative hematology ROS (+)   Anesthesia Other Findings Arthritis    GERD (gastroesophageal reflux disease)  History of kidney stones    Hyperparathyroidism ([place])    Hypertension       Reproductive/Obstetrics negative OB ROS                             Anesthesia Physical Anesthesia Plan  ASA: 2  Anesthesia Plan: General ETT   Post-op Pain Management:    Induction: Intravenous  PONV Risk Score and Plan: Ondansetron, Dexamethasone, Midazolam and Treatment may vary due to age or medical condition  Airway Management Planned: Oral ETT  Additional Equipment:   Intra-op Plan:   Post-operative Plan: Extubation in OR  Informed Consent:      Dental Advisory Given  Plan Discussed with: Anesthesiologist, CRNA and Surgeon  Anesthesia Plan Comments: (Patient consented for risks of anesthesia including but not limited to:  - adverse reactions to medications - damage to eyes, teeth, lips or other oral mucosa - nerve damage due to positioning  - sore throat or hoarseness - Damage to heart, brain, nerves, lungs, other parts of body or loss of life  Patient voiced understanding.)        Anesthesia Quick Evaluation

## 2022-01-31 NOTE — Procedures (Signed)
Interventional Radiology Procedure Note  Date of Procedure: 01/31/2022  Procedure: IR left percutaneous nephrostomy access   Findings:  1. Attempted left percutaneous nephrostomy access x2, unable to cross wire into renal pelvis    Complications: No immediate complications noted.   Estimated Blood Loss: minimal  Follow-up and Recommendations: 1. Bedrest 2 hours  2. Additional follow up per Urology    Albin Felling, MD  Vascular & Interventional Radiology  01/31/2022 10:10 AM

## 2022-01-31 NOTE — Progress Notes (Signed)
Unsuccessful PCN you placement today.  In the absence of access, unable to complete PCNL.  Case was discussed with Dr.El-Abd  Plan to resume suppressive antibiotics for the next 2 months, reassess plan for kidney stones in early January.  She may be interested in a second opinion at [facility] or [place].  Hollice Espy, MD

## 2022-01-31 NOTE — Progress Notes (Signed)
Patient clinically stable post procedure. Per DR El -Abd, vitals stable. Multiple attempts to place PCNL per IR, but unsuccessful, contacting Dr Erlene Quan for further discussion. Patient awake/alert with update given to her prior to transferring her back to SDS. Received Versed 2 mg along with Fentanyl 150 mcg IV for procedure. Report given to [employer] post procedure/bedside/SDS,

## 2022-02-02 ENCOUNTER — Telehealth: Payer: Self-pay

## 2022-02-02 NOTE — Telephone Encounter (Signed)
Called pt to f/u on access nurse-  Failed PCNL on 11/27. Pt saw 2 small amounts of blood in urine yesterday. None since. NO fever or pain. Pushing fluids and taking ATB.   Pt advised to contact office if anything changes. Pt voiced understanding.

## 2022-02-21 ENCOUNTER — Encounter: Payer: Self-pay | Admitting: Urology

## 2022-03-09 ENCOUNTER — Ambulatory Visit: Payer: Medicare HMO | Admitting: Urology

## 2022-04-13 ENCOUNTER — Other Ambulatory Visit: Payer: Self-pay | Admitting: Physician Assistant

## 2022-04-13 DIAGNOSIS — Z1231 Encounter for screening mammogram for malignant neoplasm of breast: Secondary | ICD-10-CM

## 2022-05-09 ENCOUNTER — Ambulatory Visit
Admission: RE | Admit: 2022-05-09 | Discharge: 2022-05-09 | Disposition: A | Discharge status: Home / Self Care | Payer: Medicare HMO | Source: Ambulatory Visit | Attending: Physician Assistant | Admitting: Physician Assistant

## 2022-05-09 DIAGNOSIS — Z1231 Encounter for screening mammogram for malignant neoplasm of breast: Secondary | ICD-10-CM | POA: Diagnosis present

## 2022-07-20 NOTE — Addendum Note (Signed)
Encounter addended by: Meta Hatchet, RT on: 07/20/2022 9:41 AM  Actions taken: Imaging Exam ended

## 2022-09-27 ENCOUNTER — Ambulatory Visit
Admission: RE | Admit: 2022-09-27 | Discharge: 2022-09-27 | Disposition: A | Discharge status: Home / Self Care | Payer: Medicare HMO | Source: Ambulatory Visit | Attending: Physician Assistant | Admitting: Physician Assistant

## 2022-09-27 ENCOUNTER — Other Ambulatory Visit: Admission: RE | Admit: 2022-09-27 | Payer: Medicare HMO | Source: Ambulatory Visit

## 2022-09-27 ENCOUNTER — Other Ambulatory Visit: Payer: Self-pay | Admitting: Physician Assistant

## 2022-09-27 DIAGNOSIS — R1010 Upper abdominal pain, unspecified: Secondary | ICD-10-CM | POA: Diagnosis not present

## 2022-09-27 MED ORDER — IOHEXOL 300 MG/ML  SOLN
100.0000 mL | Freq: Once | INTRAMUSCULAR | Status: AC | PRN
  Administered 2022-09-27: 100 mL via INTRAVENOUS

## 2022-11-23 ENCOUNTER — Ambulatory Visit: Payer: Medicare HMO

## 2022-11-23 DIAGNOSIS — D123 Benign neoplasm of transverse colon: Secondary | ICD-10-CM | POA: Diagnosis not present

## 2022-11-23 DIAGNOSIS — K5730 Diverticulosis of large intestine without perforation or abscess without bleeding: Secondary | ICD-10-CM | POA: Diagnosis not present

## 2022-11-23 DIAGNOSIS — D124 Benign neoplasm of descending colon: Secondary | ICD-10-CM | POA: Diagnosis not present

## 2022-11-23 DIAGNOSIS — K621 Rectal polyp: Secondary | ICD-10-CM | POA: Diagnosis not present

## 2022-11-23 DIAGNOSIS — Z1211 Encounter for screening for malignant neoplasm of colon: Secondary | ICD-10-CM | POA: Diagnosis present

## 2022-11-23 DIAGNOSIS — D122 Benign neoplasm of ascending colon: Secondary | ICD-10-CM | POA: Diagnosis not present

## 2022-11-23 DIAGNOSIS — K641 Second degree hemorrhoids: Secondary | ICD-10-CM | POA: Diagnosis not present

## 2022-12-29 ENCOUNTER — Inpatient Hospital Stay
Admission: EM | Admit: 2022-12-29 | Discharge: 2023-01-06 | DRG: 329 | Disposition: A | Discharge status: Home Health | Payer: Medicare HMO | Attending: Surgery | Admitting: Surgery

## 2022-12-29 ENCOUNTER — Other Ambulatory Visit: Payer: Self-pay

## 2022-12-29 ENCOUNTER — Emergency Department: Payer: Medicare HMO

## 2022-12-29 DIAGNOSIS — I700 Atherosclerosis of aorta: Secondary | ICD-10-CM | POA: Diagnosis present

## 2022-12-29 DIAGNOSIS — R198 Other specified symptoms and signs involving the digestive system and abdomen: Secondary | ICD-10-CM

## 2022-12-29 DIAGNOSIS — K567 Ileus, unspecified: Secondary | ICD-10-CM | POA: Diagnosis not present

## 2022-12-29 DIAGNOSIS — E213 Hyperparathyroidism, unspecified: Secondary | ICD-10-CM | POA: Diagnosis present

## 2022-12-29 DIAGNOSIS — K650 Generalized (acute) peritonitis: Secondary | ICD-10-CM | POA: Diagnosis present

## 2022-12-29 DIAGNOSIS — R7303 Prediabetes: Secondary | ICD-10-CM | POA: Diagnosis present

## 2022-12-29 DIAGNOSIS — K5732 Diverticulitis of large intestine without perforation or abscess without bleeding: Secondary | ICD-10-CM | POA: Diagnosis present

## 2022-12-29 DIAGNOSIS — J432 Centrilobular emphysema: Secondary | ICD-10-CM | POA: Diagnosis present

## 2022-12-29 DIAGNOSIS — Z87442 Personal history of urinary calculi: Secondary | ICD-10-CM

## 2022-12-29 DIAGNOSIS — Z79899 Other long term (current) drug therapy: Secondary | ICD-10-CM

## 2022-12-29 DIAGNOSIS — K5720 Diverticulitis of large intestine with perforation and abscess without bleeding: Secondary | ICD-10-CM | POA: Diagnosis present

## 2022-12-29 DIAGNOSIS — E876 Hypokalemia: Secondary | ICD-10-CM | POA: Diagnosis present

## 2022-12-29 DIAGNOSIS — R309 Painful micturition, unspecified: Secondary | ICD-10-CM | POA: Diagnosis present

## 2022-12-29 DIAGNOSIS — F17210 Nicotine dependence, cigarettes, uncomplicated: Secondary | ICD-10-CM | POA: Diagnosis present

## 2022-12-29 DIAGNOSIS — I10 Essential (primary) hypertension: Secondary | ICD-10-CM | POA: Diagnosis present

## 2022-12-29 DIAGNOSIS — Z7982 Long term (current) use of aspirin: Secondary | ICD-10-CM

## 2022-12-29 DIAGNOSIS — E785 Hyperlipidemia, unspecified: Secondary | ICD-10-CM | POA: Diagnosis present

## 2022-12-29 DIAGNOSIS — K219 Gastro-esophageal reflux disease without esophagitis: Secondary | ICD-10-CM | POA: Diagnosis present

## 2022-12-29 DIAGNOSIS — K5780 Diverticulitis of intestine, part unspecified, with perforation and abscess without bleeding: Principal | ICD-10-CM

## 2022-12-29 LAB — COMPREHENSIVE METABOLIC PANEL
ALT: 28 U/L (ref 0–44)
AST: 22 U/L (ref 15–41)
Albumin: 4.1 g/dL (ref 3.5–5.0)
Alkaline Phosphatase: 98 U/L (ref 38–126)
Anion gap: 9 (ref 5–15)
BUN: 10 mg/dL (ref 8–23)
CO2: 24 mmol/L (ref 22–32)
Calcium: 8.5 mg/dL — ABNORMAL LOW (ref 8.9–10.3)
Chloride: 100 mmol/L (ref 98–111)
Creatinine, Ser: 0.71 mg/dL (ref 0.44–1.00)
GFR, Estimated: >60 mL/min (ref >60)
Glucose, Bld: 130 mg/dL — ABNORMAL HIGH (ref 70–99)
Potassium: 3.6 mmol/L (ref 3.5–5.1)
Sodium: 133 mmol/L — ABNORMAL LOW (ref 135–145)
Total Bilirubin: 1.3 mg/dL — ABNORMAL HIGH (ref 0.3–1.2)
Total Protein: 7.4 g/dL (ref 6.5–8.1)

## 2022-12-29 LAB — CBC
HCT: 42.6 % (ref 36.0–46.0)
HCT: 38 % (ref 36.0–46.0)
Hemoglobin: 14 g/dL (ref 12.0–15.0)
Hemoglobin: 12.1 g/dL (ref 12.0–15.0)
MCH: 29.2 pg (ref 26.0–34.0)
MCH: 28.9 pg (ref 26.0–34.0)
MCHC: 32.9 g/dL (ref 30.0–36.0)
MCHC: 31.8 g/dL (ref 30.0–36.0)
MCV: 88.9 fL (ref 80.0–100.0)
MCV: 90.9 fL (ref 80.0–100.0)
Platelets: 301 10*3/uL (ref 150–400)
Platelets: 245 10*3/uL (ref 150–400)
RBC: 4.79 MIL/uL (ref 3.87–5.11)
RBC: 4.18 MIL/uL (ref 3.87–5.11)
RDW: 12.7 % (ref 11.5–15.5)
RDW: 13 % (ref 11.5–15.5)
WBC: 25.2 10*3/uL — ABNORMAL HIGH (ref 4.0–10.5)
WBC: 17.9 10*3/uL — ABNORMAL HIGH (ref 4.0–10.5)
nRBC: 0 % (ref 0.0–0.2)
nRBC: 0 % (ref 0.0–0.2)

## 2022-12-29 LAB — LACTIC ACID, PLASMA: Lactic Acid, Venous: 1 mmol/L (ref 0.5–1.9)

## 2022-12-29 LAB — LIPASE, BLOOD: Lipase: 25 U/L (ref 11–51)

## 2022-12-29 LAB — HIV ANTIBODY (ROUTINE TESTING W REFLEX): HIV Screen 4th Generation wRfx: NONREACTIVE

## 2022-12-29 MED ORDER — PIPERACILLIN-TAZOBACTAM 3.375 G IVPB
3.3750 g | Freq: Three times a day (TID) | INTRAVENOUS | Status: AC
  Administered 2022-12-29 – 2023-01-04 (×20): 3.375 g via INTRAVENOUS
  Filled 2022-12-29 (×18): qty 50

## 2022-12-29 MED ORDER — ONDANSETRON 4 MG PO TBDP: 4.0000 mg | ORAL_TABLET | Freq: Four times a day (QID) | ORAL | Status: DC | PRN

## 2022-12-29 MED ORDER — DOCUSATE SODIUM 100 MG PO CAPS: 100.0000 mg | ORAL_CAPSULE | Freq: Two times a day (BID) | ORAL | Status: DC | PRN

## 2022-12-29 MED ORDER — MORPHINE SULFATE (PF) 2 MG/ML IV SOLN
2.0000 mg | INTRAVENOUS | Status: DC | PRN
  Administered 2022-12-29 – 2023-01-01 (×8): 2 mg via INTRAVENOUS
  Filled 2022-12-29 (×8): qty 1

## 2022-12-29 MED ORDER — TRAMADOL HCL 50 MG PO TABS
50.0000 mg | ORAL_TABLET | Freq: Four times a day (QID) | ORAL | Status: DC | PRN
  Administered 2022-12-31: 50 mg via ORAL
  Filled 2022-12-29 (×2): qty 1

## 2022-12-29 MED ORDER — ONDANSETRON HCL 4 MG/2ML IJ SOLN
4.0000 mg | Freq: Four times a day (QID) | INTRAMUSCULAR | Status: DC | PRN
  Administered 2023-01-03 – 2023-01-05 (×3): 4 mg via INTRAVENOUS
  Filled 2022-12-29 (×3): qty 2

## 2022-12-29 MED ORDER — HYDROCODONE-ACETAMINOPHEN 5-325 MG PO TABS
1.0000 | ORAL_TABLET | ORAL | Status: DC | PRN
  Administered 2022-12-30 – 2022-12-31 (×3): 2 via ORAL
  Filled 2022-12-29 (×3): qty 2

## 2022-12-29 MED ORDER — LACTATED RINGERS IV SOLN: INTRAVENOUS | Status: AC

## 2022-12-29 MED ORDER — SODIUM CHLORIDE 0.9 % IV BOLUS
1000.0000 mL | Freq: Once | INTRAVENOUS | Status: AC
  Administered 2022-12-29: 1000 mL via INTRAVENOUS

## 2022-12-29 MED ORDER — ENOXAPARIN SODIUM 40 MG/0.4ML IJ SOSY
40.0000 mg | PREFILLED_SYRINGE | INTRAMUSCULAR | Status: DC
  Administered 2022-12-30 – 2023-01-06 (×7): 40 mg via SUBCUTANEOUS
  Filled 2022-12-29 (×7): qty 0.4

## 2022-12-29 MED ORDER — LISINOPRIL 10 MG PO TABS
10.0000 mg | ORAL_TABLET | ORAL | Status: DC
  Administered 2022-12-29 – 2023-01-03 (×5): 10 mg via ORAL
  Filled 2022-12-29 (×6): qty 1

## 2022-12-29 MED ORDER — ONDANSETRON HCL 4 MG/2ML IJ SOLN
4.0000 mg | Freq: Once | INTRAMUSCULAR | Status: AC
Start: 2022-12-29 — End: 2022-12-29
  Administered 2022-12-29: 4 mg via INTRAVENOUS
  Filled 2022-12-29: qty 2

## 2022-12-29 MED ORDER — IOHEXOL 300 MG/ML  SOLN
100.0000 mL | Freq: Once | INTRAMUSCULAR | Status: AC | PRN
  Administered 2022-12-29: 100 mL via INTRAVENOUS

## 2022-12-29 MED ORDER — HYDROMORPHONE HCL 1 MG/ML IJ SOLN
1.0000 mg | Freq: Once | INTRAMUSCULAR | Status: AC
  Administered 2022-12-29: 1 mg via INTRAVENOUS
  Filled 2022-12-29: qty 1

## 2022-12-29 MED ORDER — PIPERACILLIN-TAZOBACTAM 3.375 G IVPB 30 MIN
3.3750 g | Freq: Once | INTRAVENOUS | Status: AC
  Administered 2022-12-29: 3.375 g via INTRAVENOUS
  Filled 2022-12-29: qty 50

## 2022-12-29 NOTE — ED Triage Notes (Signed)
Pt states she began to have abd pain, n/v yesterday. Hx diverticulitis

## 2022-12-29 NOTE — ED Notes (Signed)
Assisted to restroom.

## 2022-12-29 NOTE — ED Notes (Signed)
Patient ambulated to bathroom and back with steady gait and no assistance. Pt hooked back up to monitoring equipment and IV at this time.

## 2022-12-29 NOTE — ED Notes (Signed)
Antibiotics started after blood cultures drawn.  

## 2022-12-29 NOTE — ED Notes (Signed)
See triage note, pt to ED for abd pain started last night with n/v. Reports hx diverticulitis.

## 2022-12-29 NOTE — ED Notes (Signed)
Desat 85 % after dilaudid administration. 2 LNC placed. Dr Erma Heritage notified

## 2022-12-29 NOTE — H&P (Signed)
Subjective:   CC: Diverticulitis  HPI:  Michele Sanders is a 67 y.o. female who was consulted by Erma Heritage for issue above.  Symptoms were first noted 1 day ago. Pain is sharp, lower abdomen.  Associated with nothing, exacerbated by touch.  History of diverticulitis in the past treated with outpatient antibiotic treatment.  Recent colonoscopy with diverticulosis and several polyps removed.   Past Medical History:  has a past medical history of Arthritis, GERD (gastroesophageal reflux disease), History of kidney stones, Hyperparathyroidism (HCC), Hypertension, Pneumonia, PONV (postoperative nausea and vomiting), and Pre-diabetes.  Past Surgical History:  Past Surgical History:  Procedure Laterality Date   APPENDECTOMY     BACK SURGERY     L4-L5   BREAST BIOPSY Left 04/09/2019   neg   BREAST CYST ASPIRATION Left 04/08/2019   neg   COLONOSCOPY W/ POLYPECTOMY     CYSTOSCOPY/URETEROSCOPY/HOLMIUM LASER/STENT PLACEMENT Left 09/29/2020   Procedure: CYSTOSCOPY/URETEROSCOPY, /STENT PLACEMENT;  Surgeon: Riki Altes, MD;  Location: [facility];  Service: Urology;  Laterality: Left;   CYSTOSCOPY/URETEROSCOPY/HOLMIUM LASER/STENT PLACEMENT Left 11/10/2020   Procedure: CYSTOSCOPY/URETEROSCOPY/HOLMIUM LASER/STENT PLACEMENT;  Surgeon: Riki Altes, MD;  Location: [facility];  Service: Urology;  Laterality: Left;   FRACTURE SURGERY Right    humerus 2005   IR URETERAL STENT LEFT NEW ACCESS W/O SEP NEPHROSTOMY CATH  01/31/2022    Family History: family history is not on file.  Social History:  reports that she has been smoking cigarettes. She has a 15 pack-year smoking history. She has never used smokeless tobacco. She reports current alcohol use. She reports that she does not use drugs.  Current Medications:  Prior to Admission medications   Medication Sig Start Date End Date Taking? Authorizing Provider  acetaminophen (TYLENOL) 500 MG tablet Take 1,000 mg by mouth every 6 (six) hours as needed.   Yes  [provider]  aspirin 81 MG chewable tablet Chew 81 mg by mouth daily.   Yes [provider]  cholecalciferol (VITAMIN D3) 25 MCG (1000 UNIT) tablet Take 1,000 Units by mouth daily.   Yes [provider]  ciprofloxacin (CIPRO) 500 MG tablet Take 500 mg by mouth 2 (two) times daily. 12/28/22 01/07/23 Yes [provider]  cyanocobalamin (VITAMIN B12) 1000 MCG tablet Take 1,000 mcg by mouth daily.   Yes [provider]  docusate sodium (COLACE) 100 MG capsule Take 1 capsule (100 mg total) by mouth daily as needed. 01/31/22 01/31/23 Yes Vanna Scotland, MD  ibuprofen (ADVIL) 200 MG tablet Take 200 mg by mouth every 6 (six) hours as needed. 2 tablets   Yes [provider]  lisinopril (ZESTRIL) 20 MG tablet Take 20 mg by mouth daily. 12/12/22  Yes [provider]  metroNIDAZOLE (FLAGYL) 500 MG tablet Take 500 mg by mouth 3 (three) times daily. 12/28/22 01/07/23 Yes [provider]  pantoprazole (PROTONIX) 40 MG tablet Take 40 mg by mouth daily.   Yes [provider]  simvastatin (ZOCOR) 40 MG tablet Take 40 mg by mouth daily at 6 PM.   Yes [provider]    Allergies:  Allergies as of 12/29/2022   (No Known Allergies)    ROS:  General: Denies weight loss, weight gain, fatigue, fevers, chills, and night sweats. Eyes: Denies blurry vision, double vision, eye pain, itchy eyes, and tearing. Ears: Denies hearing loss, earache, and ringing in ears. Nose: Denies sinus pain, congestion, infections, runny nose, and nosebleeds. Mouth/throat: Denies hoarseness, sore throat, bleeding gums, and difficulty swallowing. Heart:  Denies chest pain, palpitations, racing heart, irregular heartbeat, leg pain or swelling, and decreased activity tolerance. Respiratory: Denies breathing difficulty, shortness of breath, wheezing, cough, and sputum. GI: Denies change in appetite, heartburn, nausea, vomiting, constipation, diarrhea,  and blood in stool. GU: Denies difficulty urinating, pain with urinating, urgency, frequency, blood in urine. Musculoskeletal: Denies joint stiffness, pain, swelling, muscle weakness. Skin: Denies rash, itching, mass, tumors, sores, and boils Neurologic: Denies headache, fainting, dizziness, seizures, numbness, and tingling. Psychiatric: Denies depression, anxiety, difficulty sleeping, and memory loss. Endocrine: Denies heat or cold intolerance, and increased thirst or urination. Blood/lymph: Denies easy bruising, easy bruising, and swollen glands     Objective:     BP 125/81   Pulse 71   Temp 97.9 F (36.6 C)   Resp 14   Ht 5\' 4"  (1.626 m)   Wt 85.7 kg   SpO2 96%   BMI 32.44 kg/m   Constitutional :  alert, cooperative, appears stated age, and no distress  Lymphatics/Throat:  no asymmetry, masses, or scars  Respiratory:  clear to auscultation bilaterally  Cardiovascular:  regular rate and rhythm  Gastrointestinal: Soft, no guarding, moderate tenderness to palpation suprapubic area .   Musculoskeletal: Steady movement  Skin: Cool and moist  Psychiatric: Normal affect, non-agitated, not confused       LABS:     Latest Ref Rng & Units 12/29/2022    6:12 AM 01/31/2022    7:12 AM 01/21/2022    1:36 PM  CMP  Glucose 70 - 99 mg/dL 130  865  784   BUN 8 - 23 mg/dL 10  18  16    Creatinine 0.44 - 1.00 mg/dL 6.96  2.95  2.84   Sodium 135 - 145 mmol/L 133  139  138   Potassium 3.5 - 5.1 mmol/L 3.6  4.1  3.8   Chloride 98 - 111 mmol/L 100  108  103   CO2 22 - 32 mmol/L 24  23  26    Calcium 8.9 - 10.3 mg/dL 8.5  9.2  9.4   Total Protein 6.5 - 8.1 g/dL 7.4     Total Bilirubin 0.3 - 1.2 mg/dL 1.3     Alkaline Phos 38 - 126 U/L 98     AST 15 - 41 U/L 22     ALT 0 - 44 U/L 28         Latest Ref Rng & Units 12/29/2022    6:12 AM 01/31/2022    7:12 AM 01/21/2022    1:36 PM  CBC  WBC 4.0 - 10.5 K/uL 25.2  10.3  11.3   Hemoglobin 12.0 - 15.0 g/dL 13.2  44.0  10.2    Hematocrit 36.0 - 46.0 % 42.6  43.1  43.5   Platelets 150 - 400 K/uL 301  280  220     RADS: Narrative & Impression  CLINICAL DATA:  Left lower quadrant abdominal pain   EXAM: CT ABDOMEN AND PELVIS WITH CONTRAST   TECHNIQUE: Multidetector CT imaging of the abdomen and pelvis was performed using the standard protocol following bolus administration of intravenous contrast.   RADIATION DOSE REDUCTION: This exam was performed according to the departmental dose-optimization program which includes automated exposure control, adjustment of the mA and/or kV according to patient size and/or use of iterative reconstruction technique.   CONTRAST:  OMNIPAQUE IOHEXOL 300 MG/ML  SOLN   COMPARISON:  09/27/2022   FINDINGS: Lower chest: Centrilobular emphysema. Mild dependent subsegmental atelectasis in the lower lobes and  lingula. Mild cardiomegaly. Small to moderate-sized hiatal hernia.   Hepatobiliary: Diffuse hepatic steatosis. Stable 4 mm hypodense lesion favoring cyst in the right hepatic lobe on image 36 series 3. No biliary dilatation. No portal venous gas observed.   Pancreas: Unremarkable   Spleen: Unremarkable   Adrenals/Urinary Tract: 5.1 by 4.5 cm simple right renal cyst, image 42 series 3. No further imaging workup of this lesion is indicated.   Benign 1.1 cm left renal cyst on image 35 series 3. No further imaging workup of this lesion is indicated.   Benign 1.6 by 0.8 cm left renal cyst on image 40 series 3. No further imaging workup of this lesion is indicated.   Mild scarring of the left kidney lower pole. Three left kidney lower pole calculi, the largest measuring 1.6 cm in long axis on image 42 series 3.   Stomach/Bowel: Scattered free intraperitoneal gas compatible with perforated viscus. The sigmoid colon diverticulitis on image 83 series 3, this is highly likely to be the cause of the free intraperitoneal gas no current abscess pocket. This site of  abnormal bowel wall thickening and extraluminal gas along with mesenteric stranding is just distal to the prior site of inflammatory wall thickening and diverticulitis shown on 09/27/2022.   Mildly mobile cecum in the right abdomen. Several high-density pill-like structures are present in the cecum. Scattered diverticula of the descending colon.   Vascular/Lymphatic: Atherosclerosis is present, including aortoiliac atherosclerotic disease. Mild atheromatous plaque proximally in the SMA without high-grade stenosis. No pathologic adenopathy.   Reproductive: Dense calcified 3.1 cm right uterine body fibroid, image 83 series 3, stable.   Other: No supplemental non-categorized findings.   Musculoskeletal: Mild lower lumbar spondylosis and degenerative disc disease.   IMPRESSION: 1. Scattered moderate free intraperitoneal gas compatible with perforated viscus. The source of this free gas is likely from acute sigmoid colon diverticulitis, just distal to the prior site of inflammatory wall thickening and diverticulitis shown on 09/27/2022. No current abscess pocket. 2. Diffuse hepatic steatosis. 3. Small to moderate-sized hiatal hernia. 4. Left kidney lower pole nonobstructing calculi. 5. Mild cardiomegaly. 6. Centrilobular emphysema. 7. Aortic atherosclerosis.   Aortic Atherosclerosis (ICD10-I70.0) and Emphysema (ICD10-J43.9).   Critical Value/emergent results were called by telephone at the time of interpretation on 12/29/2022 at 9:15 am to provider [facility] , who verbally acknowledged these results.     Electronically Signed   By: Gaylyn Rong M.D.   On: 12/29/2022 09:15     Assessment:   Diverticulitis continue to consist of  Plan:   Patient has moderate amount of tenderness to palpation but her heart rate is within normal limits and no sign of overt peritonitis.  Initially recommended IV antibiotic treatment and bowel rest, understanding that proceeding  with surgery is not totally out of the question at this time  N.p.o., IV fluids and pain control.  Continue home medications for her hypertension.  The patient verbalized understanding and all questions were answered to the patient's satisfaction.  labs/images/medications/previous chart entries reviewed personally and relevant changes/updates noted above.

## 2022-12-29 NOTE — ED Provider Notes (Signed)
[facility] Provider Note    Event Date/Time   First MD Initiated Contact with Patient 12/29/22 0732     (approximate)   History   Abdominal Pain   HPI  Michele Sanders is a 67 y.o. female with past medical history of hypertension, hyperparathyroidism, GERD, here with abdominal pain.  The patient states that over the last 24 hours, she has had progressive worsening lower abdominal pain.  It is severe.  She states that she was started on antibiotics and has taken 1 or 2 doses.  Over the last few hours, the pain is significantly worsened and she now has severe pain with any kind of movement.  She said chills.  No fevers.  She has had some mild pain with urination.      Physical Exam   Triage Vital Signs: ED Triage Vitals  Encounter Vitals Group     BP 12/29/22 0611 (!) 172/98     Systolic BP Percentile --      Diastolic BP Percentile --      Pulse Rate 12/29/22 0611 (!) 104     Resp 12/29/22 0611 20     Temp 12/29/22 0611 98.6 F (37 C)     Temp Source 12/29/22 0611 Oral     SpO2 12/29/22 0611 95 %     Weight 12/29/22 0610 189 lb (85.7 kg)     Height 12/29/22 0610 5\' 4"  (1.626 m)     Head Circumference --      Peak Flow --      Pain Score 12/29/22 0610 9     Pain Loc --      Pain Education --      Exclude from Growth Chart --     Most recent vital signs: Vitals:   12/29/22 0800 12/29/22 0948  BP: 139/64 (!) 146/70  Pulse: 90 76  Resp:  14  Temp:    SpO2: 95% 92%     General: Awake, no distress.  CV:  Good peripheral perfusion.  Regular rate and rhythm.  No murmurs or rubs. Resp:  Normal work of breathing.  Lungs clear to auscultation bilaterally. Abd:  Significant tenderness to palpation over the lower abdomen.  Guarding.  Slight rebound. Other:  Appears uncomfortable.   ED Results / Procedures / Treatments   Labs (all labs ordered are listed, but only abnormal results are displayed) Labs Reviewed  COMPREHENSIVE METABOLIC PANEL -  Abnormal; Notable for the following components:      Result Value   Sodium 133 (*)    Glucose, Bld 130 (*)    Calcium 8.5 (*)    Total Bilirubin 1.3 (*)    All other components within normal limits  CBC - Abnormal; Notable for the following components:   WBC 25.2 (*)    All other components within normal limits  CULTURE, BLOOD (ROUTINE X 2)  CULTURE, BLOOD (ROUTINE X 2)  LIPASE, BLOOD  LACTIC ACID, PLASMA  URINALYSIS, ROUTINE W REFLEX MICROSCOPIC     EKG    RADIOLOGY CT abdomen/pelvis: Scattered moderate free intraperitoneal gas consistent with perforated viscus from acute diverticulitis   I also independently reviewed and agree with radiologist interpretations.   PROCEDURES:  Critical Care performed: Yes, see critical care procedure note(s)   .Critical Care  Performed by: Shaune Pollack, MD Authorized by: Shaune Pollack, MD   Critical care provider statement:    Critical care time (minutes):  30   Critical care time was exclusive of:  Separately billable procedures and treating other patients   Critical care was necessary to treat or prevent imminent or life-threatening deterioration of the following conditions:  Cardiac failure, circulatory failure and respiratory failure   Critical care was time spent personally by me on the following activities:  Development of treatment plan with patient or surrogate, discussions with consultants, evaluation of patient's response to treatment, examination of patient, ordering and review of laboratory studies, ordering and review of radiographic studies, ordering and performing treatments and interventions, pulse oximetry, re-evaluation of patient's condition and review of old charts    MEDICATIONS ORDERED IN ED: Medications  HYDROmorphone (DILAUDID) injection 1 mg (1 mg Intravenous Given 12/29/22 0807)  ondansetron (ZOFRAN) injection 4 mg (4 mg Intravenous Given 12/29/22 0803)  piperacillin-tazobactam (ZOSYN) IVPB 3.375 g (0 g  Intravenous Stopped 12/29/22 0933)  sodium chloride 0.9 % bolus 1,000 mL (1,000 mLs Intravenous New Bag/Given 12/29/22 0802)  iohexol (OMNIPAQUE) 300 MG/ML solution 100 mL (100 mLs Intravenous Contrast Given 12/29/22 0823)     IMPRESSION / MDM / ASSESSMENT AND PLAN / ED COURSE  I reviewed the triage vital signs and the nursing notes.                              Differential diagnosis includes, but is not limited to, diverticulitis, colitis, volvulus, perforation, obstruction  Patient's presentation is most consistent with acute presentation with potential threat to life or bodily function.  The patient is on the cardiac monitor to evaluate for evidence of arrhythmia and/or significant heart rate changes   67 year old female with past medical history of hypertension, hyperlipidemia, here with diffuse abdominal pain.  Patient appears significantly uncomfortable on examination.  White count 25,000.  CMP is largely unremarkable.  Normal renal function and LFTs.  Lipase normal.  Lactic acid 1.0.  CT scan obtained, reviewed, shows perforated diverticulitis with small amount of free air.  Patient started on broad-spectrum antibiotics, started on IV fluids, and will admit to general surgery.  Dr. Tonna Boehringer consulted for admission.   FINAL CLINICAL IMPRESSION(S) / ED DIAGNOSES   Final diagnoses:  Perforated diverticulum  Perforated viscus     Rx / DC Orders   ED Discharge Orders     None        Note:  This document was prepared using Dragon voice recognition software and may include unintentional dictation errors.   Shaune Pollack, MD 12/29/22 1009

## 2022-12-30 LAB — BASIC METABOLIC PANEL
Anion gap: 10 (ref 5–15)
BUN: 8 mg/dL (ref 8–23)
CO2: 23 mmol/L (ref 22–32)
Calcium: 8 mg/dL — ABNORMAL LOW (ref 8.9–10.3)
Chloride: 104 mmol/L (ref 98–111)
Creatinine, Ser: 0.57 mg/dL (ref 0.44–1.00)
GFR, Estimated: >60 mL/min (ref >60)
Glucose, Bld: 100 mg/dL — ABNORMAL HIGH (ref 70–99)
Potassium: 3.3 mmol/L — ABNORMAL LOW (ref 3.5–5.1)
Sodium: 137 mmol/L (ref 135–145)

## 2022-12-30 LAB — CBC
HCT: 37.1 % (ref 36.0–46.0)
Hemoglobin: 11.6 g/dL — ABNORMAL LOW (ref 12.0–15.0)
MCH: 29.4 pg (ref 26.0–34.0)
MCHC: 31.3 g/dL (ref 30.0–36.0)
MCV: 93.9 fL (ref 80.0–100.0)
Platelets: 227 10*3/uL (ref 150–400)
RBC: 3.95 MIL/uL (ref 3.87–5.11)
RDW: 13 % (ref 11.5–15.5)
WBC: 16.8 10*3/uL — ABNORMAL HIGH (ref 4.0–10.5)
nRBC: 0 % (ref 0.0–0.2)

## 2022-12-30 MED ORDER — POTASSIUM CHLORIDE CRYS ER 20 MEQ PO TBCR
20.0000 meq | EXTENDED_RELEASE_TABLET | ORAL | Status: AC
  Administered 2022-12-30 (×2): 20 meq via ORAL
  Filled 2022-12-30 (×2): qty 1

## 2022-12-30 MED ORDER — KCL IN DEXTROSE-NACL 40-5-0.45 MEQ/L-%-% IV SOLN
INTRAVENOUS | Status: AC
  Filled 2022-12-30 (×2): qty 1000

## 2022-12-30 NOTE — ED Notes (Signed)
Pt ambulated to bathroom with steady and even gait. 

## 2022-12-31 ENCOUNTER — Inpatient Hospital Stay: Payer: Medicare HMO | Admitting: Anesthesiology

## 2022-12-31 ENCOUNTER — Other Ambulatory Visit: Payer: Self-pay

## 2022-12-31 ENCOUNTER — Encounter
Admission: EM | Disposition: A | Discharge status: Home Health | Payer: Self-pay | Source: Home / Self Care | Attending: Surgery

## 2022-12-31 DIAGNOSIS — K5720 Diverticulitis of large intestine with perforation and abscess without bleeding: Secondary | ICD-10-CM

## 2022-12-31 DIAGNOSIS — K5780 Diverticulitis of intestine, part unspecified, with perforation and abscess without bleeding: Principal | ICD-10-CM

## 2022-12-31 HISTORY — PX: COLECTOMY WITH COLOSTOMY CREATION/HARTMANN PROCEDURE: SHX6598

## 2022-12-31 LAB — CBC
HCT: 37.7 % (ref 36.0–46.0)
Hemoglobin: 12.4 g/dL (ref 12.0–15.0)
MCH: 29.2 pg (ref 26.0–34.0)
MCHC: 32.9 g/dL (ref 30.0–36.0)
MCV: 88.9 fL (ref 80.0–100.0)
Platelets: 255 10*3/uL (ref 150–400)
RBC: 4.24 MIL/uL (ref 3.87–5.11)
RDW: 12.7 % (ref 11.5–15.5)
WBC: 16.4 10*3/uL — ABNORMAL HIGH (ref 4.0–10.5)
nRBC: 0 % (ref 0.0–0.2)

## 2022-12-31 LAB — BASIC METABOLIC PANEL
Anion gap: 10 (ref 5–15)
BUN: 6 mg/dL — ABNORMAL LOW (ref 8–23)
CO2: 25 mmol/L (ref 22–32)
Calcium: 8.6 mg/dL — ABNORMAL LOW (ref 8.9–10.3)
Chloride: 99 mmol/L (ref 98–111)
Creatinine, Ser: 0.68 mg/dL (ref 0.44–1.00)
GFR, Estimated: >60 mL/min (ref >60)
Glucose, Bld: 133 mg/dL — ABNORMAL HIGH (ref 70–99)
Potassium: 3.8 mmol/L (ref 3.5–5.1)
Sodium: 134 mmol/L — ABNORMAL LOW (ref 135–145)

## 2022-12-31 SURGERY — COLECTOMY, WITH COLOSTOMY CREATION
Anesthesia: General

## 2022-12-31 MED ORDER — ONDANSETRON HCL 4 MG/2ML IJ SOLN
INTRAMUSCULAR | Status: AC
  Filled 2022-12-31: qty 2

## 2022-12-31 MED ORDER — MIDAZOLAM HCL 2 MG/2ML IJ SOLN
INTRAMUSCULAR | Status: AC
  Filled 2022-12-31: qty 2

## 2022-12-31 MED ORDER — LACTATED RINGERS IV SOLN: INTRAVENOUS | Status: DC | PRN

## 2022-12-31 MED ORDER — KETOROLAC TROMETHAMINE 15 MG/ML IJ SOLN
INTRAMUSCULAR | Status: AC
  Filled 2022-12-31: qty 1

## 2022-12-31 MED ORDER — PHENYLEPHRINE 80 MCG/ML (10ML) SYRINGE FOR IV PUSH (FOR BLOOD PRESSURE SUPPORT)
PREFILLED_SYRINGE | INTRAVENOUS | Status: AC
  Filled 2022-12-31: qty 10

## 2022-12-31 MED ORDER — ACETAMINOPHEN 10 MG/ML IV SOLN
INTRAVENOUS | Status: AC
Start: 2022-12-31 — End: ?
  Filled 2022-12-31: qty 100

## 2022-12-31 MED ORDER — ACETAMINOPHEN 500 MG PO TABS
1000.0000 mg | ORAL_TABLET | Freq: Four times a day (QID) | ORAL | Status: DC
  Administered 2022-12-31 – 2023-01-03 (×10): 1000 mg via ORAL
  Filled 2022-12-31 (×12): qty 2

## 2022-12-31 MED ORDER — SEVOFLURANE IN SOLN
RESPIRATORY_TRACT | Status: AC
  Filled 2022-12-31: qty 250

## 2022-12-31 MED ORDER — PROPOFOL 10 MG/ML IV BOLUS
INTRAVENOUS | Status: AC
  Filled 2022-12-31: qty 20

## 2022-12-31 MED ORDER — HYDROMORPHONE HCL 1 MG/ML IJ SOLN
INTRAMUSCULAR | Status: AC
  Filled 2022-12-31: qty 1

## 2022-12-31 MED ORDER — DEXAMETHASONE SODIUM PHOSPHATE 10 MG/ML IJ SOLN
INTRAMUSCULAR | Status: AC
  Filled 2022-12-31: qty 1

## 2022-12-31 MED ORDER — EPHEDRINE 5 MG/ML INJ
INTRAVENOUS | Status: AC
  Filled 2022-12-31: qty 5

## 2022-12-31 MED ORDER — ROCURONIUM BROMIDE 100 MG/10ML IV SOLN
INTRAVENOUS | Status: DC | PRN
  Administered 2022-12-31: 50 mg via INTRAVENOUS

## 2022-12-31 MED ORDER — PHENYLEPHRINE 80 MCG/ML (10ML) SYRINGE FOR IV PUSH (FOR BLOOD PRESSURE SUPPORT)
PREFILLED_SYRINGE | INTRAVENOUS | Status: DC | PRN
  Administered 2022-12-31: 160 ug via INTRAVENOUS

## 2022-12-31 MED ORDER — KETOROLAC TROMETHAMINE 15 MG/ML IJ SOLN
15.0000 mg | Freq: Four times a day (QID) | INTRAMUSCULAR | Status: AC
  Administered 2022-12-31 – 2023-01-05 (×18): 15 mg via INTRAVENOUS
  Filled 2022-12-31 (×17): qty 1

## 2022-12-31 MED ORDER — OXYCODONE HCL 5 MG PO TABS: 5.0000 mg | ORAL_TABLET | Freq: Once | ORAL | Status: DC | PRN

## 2022-12-31 MED ORDER — MIDAZOLAM HCL 2 MG/2ML IJ SOLN
INTRAMUSCULAR | Status: DC | PRN
  Administered 2022-12-31: 2 mg via INTRAVENOUS

## 2022-12-31 MED ORDER — OXYCODONE HCL 5 MG/5ML PO SOLN: 5.0000 mg | Freq: Once | ORAL | Status: DC | PRN

## 2022-12-31 MED ORDER — SEPRAFILM FOR OPTIME
ORAL_FILM | TOPICAL | Status: DC | PRN
  Administered 2022-12-31: 4 via TOPICAL

## 2022-12-31 MED ORDER — ONDANSETRON HCL 4 MG/2ML IJ SOLN
INTRAMUSCULAR | Status: DC | PRN
  Administered 2022-12-31: 4 mg via INTRAVENOUS

## 2022-12-31 MED ORDER — FENTANYL CITRATE (PF) 100 MCG/2ML IJ SOLN
INTRAMUSCULAR | Status: DC | PRN
  Administered 2022-12-31: 50 ug via INTRAVENOUS
  Administered 2022-12-31 (×6): 25 ug via INTRAVENOUS

## 2022-12-31 MED ORDER — FENTANYL CITRATE (PF) 100 MCG/2ML IJ SOLN
INTRAMUSCULAR | Status: AC
  Filled 2022-12-31: qty 2

## 2022-12-31 MED ORDER — IRRISEPT - 450ML BOTTLE WITH 0.05% CHG IN STERILE WATER, USP 99.95% OPTIME
TOPICAL | Status: DC | PRN
  Administered 2022-12-31: 450 mL

## 2022-12-31 MED ORDER — 0.9 % SODIUM CHLORIDE (POUR BTL) OPTIME
TOPICAL | Status: DC | PRN
  Administered 2022-12-31: 500 mL
  Administered 2022-12-31: 3000 mL

## 2022-12-31 MED ORDER — CHLORHEXIDINE GLUCONATE CLOTH 2 % EX PADS
6.0000 | MEDICATED_PAD | Freq: Every day | CUTANEOUS | Status: DC
  Administered 2023-01-01 – 2023-01-02 (×2): 6 via TOPICAL

## 2022-12-31 MED ORDER — PROPOFOL 10 MG/ML IV BOLUS
INTRAVENOUS | Status: DC | PRN
  Administered 2022-12-31: 13 mg via INTRAVENOUS

## 2022-12-31 MED ORDER — SODIUM CHLORIDE (PF) 0.9 % IJ SOLN
INTRAMUSCULAR | Status: DC | PRN
  Administered 2022-12-31: 100 mL

## 2022-12-31 MED ORDER — OXYCODONE HCL 5 MG PO TABS
5.0000 mg | ORAL_TABLET | ORAL | Status: DC | PRN
  Administered 2023-01-01: 5 mg via ORAL
  Filled 2022-12-31 (×2): qty 1

## 2022-12-31 MED ORDER — FENTANYL CITRATE (PF) 100 MCG/2ML IJ SOLN: INTRAMUSCULAR | Status: DC | PRN

## 2022-12-31 MED ORDER — SUGAMMADEX SODIUM 200 MG/2ML IV SOLN
INTRAVENOUS | Status: DC | PRN
  Administered 2022-12-31: 171.4 mg via INTRAVENOUS

## 2022-12-31 MED ORDER — HYDROMORPHONE HCL 1 MG/ML IJ SOLN
0.2500 mg | INTRAMUSCULAR | Status: DC | PRN
  Administered 2022-12-31: 0.25 mg via INTRAVENOUS

## 2022-12-31 MED ORDER — DEXAMETHASONE SODIUM PHOSPHATE 10 MG/ML IJ SOLN
INTRAMUSCULAR | Status: DC | PRN
  Administered 2022-12-31: 10 mg via INTRAVENOUS

## 2022-12-31 MED ORDER — LIDOCAINE HCL (CARDIAC) PF 100 MG/5ML IV SOSY
PREFILLED_SYRINGE | INTRAVENOUS | Status: DC | PRN
  Administered 2022-12-31: 100 mg via INTRAVENOUS

## 2022-12-31 MED ORDER — PIPERACILLIN-TAZOBACTAM 3.375 G IVPB
INTRAVENOUS | Status: AC
  Filled 2022-12-31: qty 50

## 2022-12-31 MED ORDER — EPHEDRINE SULFATE-NACL 50-0.9 MG/10ML-% IV SOSY
PREFILLED_SYRINGE | INTRAVENOUS | Status: DC | PRN
  Administered 2022-12-31: 10 mg via INTRAVENOUS

## 2022-12-31 MED ORDER — PREGABALIN 50 MG PO CAPS
50.0000 mg | ORAL_CAPSULE | Freq: Three times a day (TID) | ORAL | Status: DC
  Administered 2022-12-31 – 2023-01-06 (×14): 50 mg via ORAL
  Filled 2022-12-31 (×15): qty 1

## 2022-12-31 MED ORDER — DEXMEDETOMIDINE HCL IN NACL 80 MCG/20ML IV SOLN
INTRAVENOUS | Status: DC | PRN
  Administered 2022-12-31: 8 ug via INTRAVENOUS

## 2022-12-31 SURGICAL SUPPLY — 57 items
ADH LQ OCL WTPRF AMP STRL LF (MISCELLANEOUS) ×1
ADHESIVE MASTISOL STRL (MISCELLANEOUS) IMPLANT
BARRIER ADH SEPRAFILM 3INX5IN (MISCELLANEOUS) IMPLANT
BRR ADH 5X3 SEPRAFILM 2 SHT (MISCELLANEOUS) ×2
DRAIN CHANNEL JP 19F (MISCELLANEOUS) IMPLANT
DRAIN PENROSE .5X12 LATEX STL (DRAIN) IMPLANT
DRAPE LAPAROTOMY 100X77 ABD (DRAPES) ×1 IMPLANT
DRSG OPSITE POSTOP 4X10 (GAUZE/BANDAGES/DRESSINGS) IMPLANT
DRSG TEGADERM 4X4.75 (GAUZE/BANDAGES/DRESSINGS) IMPLANT
ELECT BLADE 6.5 EXT (BLADE) IMPLANT
ELECT CAUTERY BLADE 6.4 (BLADE) ×1 IMPLANT
ELECT EZSTD 165MM 6.5IN (MISCELLANEOUS) ×1
ELECT REM PT RETURN 9FT ADLT (ELECTROSURGICAL) ×1
ELECTRODE EZSTD 165MM 6.5IN (MISCELLANEOUS) ×1 IMPLANT
ELECTRODE REM PT RTRN 9FT ADLT (ELECTROSURGICAL) ×1 IMPLANT
EVACUATOR SILICONE 100CC (DRAIN) IMPLANT
GAUZE 4X4 16PLY ~~LOC~~+RFID DBL (SPONGE) ×1 IMPLANT
GAUZE SPONGE 4X4 12PLY STRL (GAUZE/BANDAGES/DRESSINGS) ×1 IMPLANT
GLOVE BIO SURGEON STRL SZ7 (GLOVE) ×2 IMPLANT
GOWN STRL REUS W/ TWL LRG LVL3 (GOWN DISPOSABLE) ×4 IMPLANT
GOWN STRL REUS W/TWL LRG LVL3 (GOWN DISPOSABLE) ×4
JET LAVAGE IRRISEPT WOUND (IRRIGATION / IRRIGATOR) ×1
KIT OSTOMY 2 PC DRNBL 2.25 STR (WOUND CARE) IMPLANT
KIT OSTOMY DRAINABLE 2.25 STR (WOUND CARE) ×1
KIT TURNOVER KIT A (KITS) ×1 IMPLANT
LABEL OR SOLS (LABEL) ×1 IMPLANT
LAVAGE JET IRRISEPT WOUND (IRRIGATION / IRRIGATOR) IMPLANT
LIGASURE IMPACT 36 18CM CVD LR (INSTRUMENTS) ×1 IMPLANT
MANIFOLD NEPTUNE II (INSTRUMENTS) ×1 IMPLANT
NDL HYPO 22X1.5 SAFETY MO (MISCELLANEOUS) ×1 IMPLANT
NEEDLE HYPO 22X1.5 SAFETY MO (MISCELLANEOUS) ×1 IMPLANT
NS IRRIG 1000ML POUR BTL (IV SOLUTION) ×1 IMPLANT
NS IRRIG 500ML POUR BTL (IV SOLUTION) IMPLANT
PACK BASIN MAJOR ARMC (MISCELLANEOUS) ×1 IMPLANT
RELOAD PROXIMATE 75MM BLUE (ENDOMECHANICALS) IMPLANT
RELOAD STAPLE 75 3.8 BLU REG (ENDOMECHANICALS) IMPLANT
SPIKE FLUID TRANSFER (MISCELLANEOUS) ×1 IMPLANT
SPONGE DRAIN TRACH 4X4 STRL 2S (GAUZE/BANDAGES/DRESSINGS) IMPLANT
SPONGE T-LAP 18X18 ~~LOC~~+RFID (SPONGE) ×5 IMPLANT
STAPLER CVD CUT BL 40 RELOAD (ENDOMECHANICALS) ×1 IMPLANT
STAPLER CVD CUT BLU 40 RELOAD (ENDOMECHANICALS) ×1 IMPLANT
STAPLER PROXIMATE 75MM BLUE (STAPLE) IMPLANT
STAPLER SKIN PROX 35W (STAPLE) ×1 IMPLANT
SUT ETHILON 3-0 FS-10 30 BLK (SUTURE) ×1
SUT PDS AB 0 CT1 27 (SUTURE) ×3 IMPLANT
SUT PROLENE 2 0 SH DA (SUTURE) IMPLANT
SUT SILK 2 0 (SUTURE) ×1
SUT SILK 2 0SH CR/8 30 (SUTURE) ×1 IMPLANT
SUT SILK 2-0 18XBRD TIE 12 (SUTURE) ×1 IMPLANT
SUT VIC AB 3-0 SH 27 (SUTURE) ×3
SUT VIC AB 3-0 SH 27X BRD (SUTURE) ×1 IMPLANT
SUTURE EHLN 3-0 FS-10 30 BLK (SUTURE) IMPLANT
SYR 20ML LL LF (SYRINGE) ×1 IMPLANT
SYR 30ML LL (SYRINGE) IMPLANT
TRAP FLUID SMOKE EVACUATOR (MISCELLANEOUS) ×1 IMPLANT
TRAY FOLEY MTR SLVR 16FR STAT (SET/KITS/TRAYS/PACK) ×1 IMPLANT
WATER STERILE IRR 500ML POUR (IV SOLUTION) ×1 IMPLANT

## 2022-12-31 NOTE — Plan of Care (Signed)
?  Problem: Education: ?Goal: Knowledge of General Education information will improve ?Description: Including pain rating scale, medication(s)/side effects and non-pharmacologic comfort measures ?Outcome: Progressing ?  ?Problem: Clinical Measurements: ?Goal: Ability to maintain clinical measurements within normal limits will improve ?Outcome: Progressing ?Goal: Respiratory complications will improve ?Outcome: Progressing ?  ?Problem: Nutrition: ?Goal: Adequate nutrition will be maintained ?Outcome: Progressing ?  ?

## 2022-12-31 NOTE — Op Note (Signed)
PROCEDURES: Open Hartmann's procedure Takedown of splenic flexure  Pre-operative Diagnosis: perforated diverticulitis w peritonitis  Post-operative Diagnosis: Same   Surgeon: Merri Ray Can Lucci    Anesthesia: General endotracheal anesthesia  ASA Class: 2  Surgeon: Sterling Big , MD FACS  Anesthesia: Gen. with endotracheal tube  Findings: Purulent peritonitis Acute diverticulitis With Phlegmon and perforation  Estimated Blood Loss: 50cc         Drains: 19 Fr pelvis         Specimens: sigmoid colon          Complications: none         Condition: stable  Procedure Details  The patient was seen again in the Holding Room. The benefits, complications, treatment options, and expected outcomes were discussed with the patient. The risks of bleeding, infection, recurrence of symptoms, failure to resolve symptoms,  bowel injury, any of which could require further surgery were reviewed with the patient.   The patient was taken to Operating Room, identified and the procedure verified.  A Time Out was held and the above information confirmed.  Prior to the induction of general anesthesia, antibiotic prophylaxis was administered ( pt on antibiotics preop and appropriate redosing performed per usual treatment dose). VTE prophylaxis was in place. General endotracheal anesthesia was then administered and tolerated well. After the induction, A [place] was placed by RN,  The abdomen was prepped with Chloraprep and draped in the sterile fashion. The patient was positioned in the supine position. Midline  incision was created with 10 blade knife.  Electrocautery was used to dissect through subcutaneous tissue and the fascia was elevated between Kocher clamps and incised.  The abdominal cavity was entered under direct visualization .  No injuries were seen.  Initially there was evidence of purulent fluid within the abdominal cavity. This was cultured. Patient was placed in a steep Trendelenburg position and  the sigmoid colon was identified.  There was dense sigmoid colon plastered to the pelvis and to a piece of small bowel.  The mesentery of the sigmoid colon was hard and there was evidence of a phlegmon. I Started by mobilizing the sigmoid and descending colon from a lateral to medial fashion after incising the white line of tault with electrocautery.  This allowed [place] to have some mobilization.  We were able to identify the left ureter and the iliac vessels and preserve it at all times. there was evidence of significant inflammatory response in the pelvis I was able to finger fracture adhesions and inflammation to free upe the distal sigmoid.  At this time I decided to resect the descending colon right at the junction of the pelvic rim.  We used a standard 75 GIA stapler.  I then I was able to use the vessel sealer to initially divide the healthy sigmoid mesentery towards the pelvis.  There was evidence of an abscess and there was evidence of chronic phlegmonous changes within the sigmoid and upper rectum.  The abscess was drained and we obtained cultures from this area.  We were able to irrigate the area.   By finger fracturing were able to dissect the sigmoid from the pelvis..  We stayed close to the wall of the sigmoid colon to avoid any vessel injuries.  We had a decent rectal margin.  Distal margin of resection was upper rectum This was done after we created mesentery defects in the rectum and using an [place] stapler were able to divide the rectum. Specimen passed off for final pathology.  Abdominal cavity was profusely irrigated and we went back and mobilize the splenic flexure with electrocautery in the standard fashion. Defect to the left of the abdominal wall was created using a knife and electrocautery was used to form a cruciate incision in the fascia.  We went right through the rectus abdominal muscle.  We were able to exteriorize the descending colon stump.    Second look showed no evidence of  bowel injuries and an intact ureter with no evidence of active bleeding.  A 19 Blake drain was placed in the pelvis with an exit site in the right lower quadrant and secured with a 3-0 nylon suture. Liposomal Marcaine was used to perform bilateral T AP blocks in the standard fashion under direct visualization.  Multiple sheets of Seprafilm was used to prevent adhesions.  I change gown and gloves and close the abdominal cavity with running 0 PDS using the small bite technique in the standard fashion.  I placed a Penrose and closed incision over a Penrose with multiple staplers.  All dressing was placed and we matured the end colostomy in a standard rosebud fashion with multiple 3-0 Vicryl sutures.  Ostomy appliance was placed appropriately. please note that this w  Needle and laparotomy count were correct and there were no immediate complications.  Sterling Big, MD, FACS

## 2022-12-31 NOTE — Progress Notes (Signed)
Subjective:  CC: Michele Sanders is a 67 y.o. female  Hospital stay day 1,   diverticulitis  HPI: Pain slightly improved.    ROS:  General: Denies weight loss, weight gain, fatigue, fevers, chills, and night sweats. Heart: Denies chest pain, palpitations, racing heart, irregular heartbeat, leg pain or swelling, and decreased activity tolerance. Respiratory: Denies breathing difficulty, shortness of breath, wheezing, cough, and sputum. GI: Denies change in appetite, heartburn, nausea, vomiting, constipation, diarrhea, and blood in stool. GU: Denies difficulty urinating, pain with urinating, urgency, frequency, blood in urine.   Objective:   VSS  Constitutional :  alert, cooperative, appears stated age, and no distress  Respiratory:  clear to auscultation bilaterally  Cardiovascular:  regular rate and rhythm  Gastrointestinal: Soft, no guarding, moderate TTP suprapubic and RLQ now, nothing on LLQ .   Skin: Cool and moist.   Psychiatric: Normal affect, non-agitated, not confused       LABS:  WBC down slightly to 16.8 from 17.9 RADS: N/a Assessment:   Diverticulitis. No worsening abdominal pain. improved wbc. Still has TTP on exam, but no acute abdomen and vitals remain stable.  Advance to clears and continue IV abx.  Monitor closely  labs/images/medications/previous chart entries reviewed personally and relevant changes/updates noted above.

## 2022-12-31 NOTE — Anesthesia Procedure Notes (Signed)
Procedure Name: Intubation Date/Time: 12/31/2022 3:30 PM  Performed by: Stormy Fabian, CRNAPre-anesthesia Checklist: Patient identified, Patient being monitored, Timeout performed, Emergency Drugs available and Suction available Patient Re-evaluated:Patient Re-evaluated prior to induction Oxygen Delivery Method: Circle system utilized Preoxygenation: Pre-oxygenation with 100% oxygen Induction Type: IV induction Ventilation: Mask ventilation without difficulty Laryngoscope Size: Mac and 4 Grade View: Grade I Tube type: Oral Tube size: 7.0 mm Number of attempts: 1 Airway Equipment and Method: Stylet Placement Confirmation: ETT inserted through vocal cords under direct vision, positive ETCO2 and breath sounds checked- equal and bilateral Secured at: 21 cm Tube secured with: Tape Dental Injury: Teeth and Oropharynx as per pre-operative assessment

## 2022-12-31 NOTE — Progress Notes (Signed)
67 year old female with evidence of acute perforated diverticulitis with moderate amount of free air patient admitted by Dr. Tonna Boehringer on 12/29/2022.  At that time she was not peritonitic and was giving a trial of medical management.  She continues to endorse significant pain.  This morning she was not able to make it to the bathroom.  Nurse started gave him both p.o. with narcotics as well as IV narcotics.  White count still 16,000.  I have personally reviewed the CT showing evidence of moderate amount of free air with locules distant to the sigmoid.  On exam she is awake and alert not toxic. Abdomen: There is obvious signs of peritonitis evidenced by rebound tenderness.  Positive Rovsing sign recent tenderness to palpation of left lower quadrant.  There is evidence of a tympanic abdomen consistent with free air  A/P 67 year old female with perforated diverticulitis and clinical signs of peritonitis.  I had an extensive discussion with the patient and her husband regarding her disease process.  My recommendation for exploratory laparotomy and Hartman's procedure.  They were very hesitant.  I talked to the daughter [name] at length who is in [place].  She is a cardiac nurse.  I discussed the situation in detail.  I recommended exploratory laparotomy today as she clearly has failed medical therapy and she does have a perforation. Patient wanted me to transfer to [facility]  In fact I did call the transfer center and talk to appropriate channels.  They are at capacity and cannot accept her in transfer.  He also wanted me to seek a second opinion from one of our local surgeons.  I contacted Dr. Tonna Boehringer who is the initial admitting doctor.  He agreed with me that given the persistent pain peritoneal signs and exploratory laparotomy is the right course of action.. Agree with the plan to proceed with laparotomy.  She again called me back and stated that had questions.  Over the phone I discussed again  my rationale the risks, the benefits and the possible complications including but not limited to: Bleeding, infection, bowel, bladder injuries.  She understands and now wishes to proceed.  I also discussed with her that likely we will be able to reverse the Hartman's in about 6 months.  Please note that I spent over 2 hours in this encounter including extensive review of medical records, extensive review of imaging studies, extensive counseling with the patient, husband and daughter.  Placing orders and performing documentation

## 2022-12-31 NOTE — Plan of Care (Signed)

## 2022-12-31 NOTE — Anesthesia Postprocedure Evaluation (Signed)
Anesthesia Post Note  Patient: Doneen Bellanca  Procedure(s) Performed: LAPAROTOMY WITH COLOSTOMY CREATION/HARTMANN PROCEDURE  Patient location during evaluation: PACU Anesthesia Type: General Level of consciousness: awake and alert Pain management: pain level controlled Vital Signs Assessment: post-procedure vital signs reviewed and stable Respiratory status: spontaneous breathing, nonlabored ventilation, respiratory function stable and patient connected to nasal cannula oxygen Cardiovascular status: blood pressure returned to baseline and stable Postop Assessment: no apparent nausea or vomiting Anesthetic complications: no   No notable events documented.   Last Vitals:  Vitals:   12/31/22 1915 12/31/22 1921  BP: 135/60   Pulse: 84 84  Resp: 17 17  Temp:    SpO2: 93% 94%    Last Pain:  Vitals:   12/31/22 1924  TempSrc:   PainSc: 3                  Louie Boston

## 2022-12-31 NOTE — Transfer of Care (Signed)
Immediate Anesthesia Transfer of Care Note  Patient: Michele Sanders  Procedure(s) Performed: Procedure(s): LAPAROTOMY WITH COLOSTOMY CREATION/HARTMANN PROCEDURE (N/A)  Patient Location: PACU  Anesthesia Type:General  Level of Consciousness: sedated  Airway & Oxygen Therapy: Patient Spontanous Breathing and Patient connected to face mask oxygen  Post-op Assessment: Report given to RN and Post -op Vital signs reviewed and stable  Post vital signs: Reviewed and stable  Last Vitals:  Vitals:   12/31/22 1514 12/31/22 1801  BP: (!) 126/59 (!) 122/51  Pulse: 68 79  Resp: 18 (!) 22  Temp:    SpO2: 93% 98%    Complications: No apparent anesthesia complications

## 2022-12-31 NOTE — Anesthesia Preprocedure Evaluation (Addendum)
Anesthesia Evaluation  Patient identified by MRN, date of birth, ID band Patient awake    Reviewed: Allergy & Precautions, NPO status , Patient's Chart, lab work & pertinent test results  History of Anesthesia Complications (+) PONV and history of anesthetic complications  Airway Mallampati: III  TM Distance: >3 FB Neck ROM: full    Dental no notable dental hx.    Pulmonary Current Smoker and Patient abstained from smoking.   Pulmonary exam normal        Cardiovascular hypertension, On Medications Normal cardiovascular exam     Neuro/Psych negative neurological ROS  negative psych ROS   GI/Hepatic Neg liver ROS,GERD  Controlled,,  Endo/Other  negative endocrine ROS    Renal/GU hyperparathyroidism     Musculoskeletal   Abdominal   Peds  Hematology negative hematology ROS (+)   Anesthesia Other Findings Past Medical History: No date: Arthritis No date: GERD (gastroesophageal reflux disease) No date: History of kidney stones No date: Hyperparathyroidism (HCC) No date: Hypertension No date: Pneumonia No date: PONV (postoperative nausea and vomiting) No date: Pre-diabetes  Past Surgical History: No date: APPENDECTOMY No date: BACK SURGERY     Comment:  L4-L5 04/09/2019: BREAST BIOPSY; Left     Comment:  neg 04/08/2019: BREAST CYST ASPIRATION; Left     Comment:  neg No date: COLONOSCOPY W/ POLYPECTOMY 09/29/2020: CYSTOSCOPY/URETEROSCOPY/HOLMIUM LASER/STENT PLACEMENT; Left     Comment:  Procedure: CYSTOSCOPY/URETEROSCOPY, /STENT PLACEMENT;                Surgeon: Riki Altes, MD;  Location: [facility] ORS;                Service: Urology;  Laterality: Left; 11/10/2020: CYSTOSCOPY/URETEROSCOPY/HOLMIUM LASER/STENT PLACEMENT; Left     Comment:  Procedure: CYSTOSCOPY/URETEROSCOPY/HOLMIUM LASER/STENT               PLACEMENT;  Surgeon: Riki Altes, MD;  Location:               [facility] ORS;  Service: Urology;   Laterality: Left; No date: FRACTURE SURGERY; Right     Comment:  humerus 2005 01/31/2022: IR URETERAL STENT LEFT NEW ACCESS W/O SEP NEPHROSTOMY CATH  BMI    Body Mass Index: 32.44 kg/m      Reproductive/Obstetrics negative OB ROS                             Anesthesia Physical Anesthesia Plan  ASA: 2 and emergent  Anesthesia Plan: General ETT   Post-op Pain Management:    Induction: Intravenous  PONV Risk Score and Plan: Ondansetron, Dexamethasone, Midazolam and Treatment may vary due to age or medical condition  Airway Management Planned: Oral ETT  Additional Equipment:   Intra-op Plan:   Post-operative Plan: Extubation in OR  Informed Consent: I have reviewed the patients History and Physical, chart, labs and discussed the procedure including the risks, benefits and alternatives for the proposed anesthesia with the patient or authorized representative who has indicated his/her understanding and acceptance.     Dental Advisory Given  Plan Discussed with: Anesthesiologist, CRNA and Surgeon  Anesthesia Plan Comments: (Patient consented for risks of anesthesia including but not limited to:  - adverse reactions to medications - damage to eyes, teeth, lips or other oral mucosa - nerve damage due to positioning  - sore throat or hoarseness - Damage to heart, brain, nerves, lungs, other parts of body or loss of life  Patient voiced  understanding and assent.)        Anesthesia Quick Evaluation

## 2023-01-01 ENCOUNTER — Encounter: Payer: Self-pay | Admitting: Surgery

## 2023-01-01 LAB — CBC
HCT: 37.8 % (ref 36.0–46.0)
Hemoglobin: 12.5 g/dL (ref 12.0–15.0)
MCH: 29.5 pg (ref 26.0–34.0)
MCHC: 33.1 g/dL (ref 30.0–36.0)
MCV: 89.2 fL (ref 80.0–100.0)
Platelets: 291 10*3/uL (ref 150–400)
RBC: 4.24 MIL/uL (ref 3.87–5.11)
RDW: 12.5 % (ref 11.5–15.5)
WBC: 16.2 10*3/uL — ABNORMAL HIGH (ref 4.0–10.5)
nRBC: 0 % (ref 0.0–0.2)

## 2023-01-01 LAB — BASIC METABOLIC PANEL
Anion gap: 9 (ref 5–15)
BUN: 12 mg/dL (ref 8–23)
CO2: 24 mmol/L (ref 22–32)
Calcium: 7.9 mg/dL — ABNORMAL LOW (ref 8.9–10.3)
Chloride: 100 mmol/L (ref 98–111)
Creatinine, Ser: 0.71 mg/dL (ref 0.44–1.00)
GFR, Estimated: >60 mL/min (ref >60)
Glucose, Bld: 141 mg/dL — ABNORMAL HIGH (ref 70–99)
Potassium: 4.1 mmol/L (ref 3.5–5.1)
Sodium: 133 mmol/L — ABNORMAL LOW (ref 135–145)

## 2023-01-01 NOTE — TOC Initial Note (Signed)
Transition of Care [facility]) - Initial/Assessment Note    Patient Details  Name: Michele Sanders MRN: [id number] Date of Birth: 11-Nov-1955  [facility]) CM/SW Contact:    Kemper Durie, RN Phone Number: 01/01/2023, 2:45 PM  Clinical Narrative:                  Patient admitted from home with husband.  Dr. Merlinda Frederick is PCP at [facility], obtains [facility].  Has shower chair at home, but does not have any other DME.  Will need ordered if any recommended.  She is aware of referral for wound/ostomy nurse for education on new ostomy, but also state she feels she will need additional help in the home for ADLs.  She is open to paying out of pocket if necessary, list of private pay in home aide services printed and provided to patient. TOC will continue to follow for needs.   Expected Discharge Plan: Home w Home Health Services Barriers to Discharge: Continued Medical Work up   Patient Goals and CMS Choice            Expected Discharge Plan and Services       Living arrangements for the past 2 months: Single Family Home                                      Prior Living Arrangements/Services Living arrangements for the past 2 months: Single Family Home Lives with:: Spouse Patient language and need for interpreter reviewed:: Yes Do you feel safe going back to the place where you live?: Yes      Need for Family Participation in Patient Care: Yes (Comment) Care giver support system in place?: Yes (comment) Current home services: DME Criminal Activity/Legal Involvement Pertinent to Current Situation/Hospitalization: No - Comment as needed  Activities of Daily Living   ADL Screening (condition at time of admission) Independently performs ADLs?: No Does the patient have a NEW difficulty with bathing/dressing/toileting/self-feeding that is expected to last >3 days?: No Does the patient have a NEW difficulty with getting in/out of  bed, walking, or climbing stairs that is expected to last >3 days?: No Does the patient have a NEW difficulty with communication that is expected to last >3 days?: No Is the patient deaf or have difficulty hearing?: No Does the patient have difficulty seeing, even when wearing glasses/contacts?: No Does the patient have difficulty concentrating, remembering, or making decisions?: No  Permission Sought/Granted Permission sought to share information with : Case Manager Permission granted to share information with : Yes, Verbal Permission Granted              Emotional Assessment       Orientation: : Oriented to Self, Oriented to Place, Oriented to  Time, Oriented to Situation   Psych Involvement: No (comment)  Admission diagnosis:  Diverticulitis large intestine [K57.32] Perforated viscus [R19.8] Perforated diverticulum [K57.80] Patient Active Problem List   Diagnosis Date Noted   Perforated diverticulum 12/31/2022   Diverticulitis large intestine 12/29/2022   Cigarette smoker one half pack a day or less 10/17/2017   Essential hypertension 10/17/2017   Osteopenia of multiple sites 10/17/2017   PCP:  Patrice Paradise, MD Pharmacy:   [phone] [place], [place] - [address] [place] [address] Phone: [phone] Fax: [phone]  [id number] [place], [place] -  [address] [facility] [phone] [place] [address] Phone: [phone] Fax: [phone]     Social Determinants of Health (SDOH) Social History: SDOH Screenings   Food Insecurity: No Food Insecurity (12/29/2022)  Housing: Low Risk  (12/29/2022)  Transportation Needs: No Transportation Needs (12/29/2022)  Utilities: Not At Risk (12/29/2022)  Financial Resource Strain: Low Risk  (12/12/2022)   Received from [facility]  Tobacco Use: High Risk (12/29/2022)   SDOH Interventions:     Readmission  Risk Interventions     No data to display

## 2023-01-01 NOTE — Plan of Care (Signed)
  Problem: Education: Goal: Knowledge of General Education information will improve Description: Including pain rating scale, medication(s)/side effects and non-pharmacologic comfort measures Outcome: Progressing   Problem: Health Behavior/Discharge Planning: Goal: Ability to manage health-related needs will improve Outcome: Progressing   Problem: Education: Goal: Knowledge of ostomy care will improve Outcome: Progressing Goal: Understanding of discharge needs will improve Outcome: Progressing

## 2023-01-01 NOTE — Progress Notes (Signed)
CC: s/p hartmann's POD #1  Subjective: Doing better, pain is only incisional today. Updated about surgery She seems more appreciative today Good UO  Tolerating CLD Labs ok  Objective: Vital signs in last 24 hours: Temp:  [97 F (36.1 C)-98.6 F (37 C)] 97.7 F (36.5 C) (10/27 0331) Pulse Rate:  [65-95] 69 (10/27 0946) Resp:  [14-23] 18 (10/27 0946) BP: (106-166)/(51-83) 166/82 (10/27 0946) SpO2:  [89 %-98 %] 98 % (10/27 0946) Last BM Date : 12/29/22  Intake/Output from previous day: 10/26 0701 - 10/27 0700 In: 1360 [P.O.:360; I.V.:900; IV Piggyback:100] Out: 655 [Urine:470; Drains:185] Intake/Output this shift: No intake/output data recorded.  Physical exam:  NAD ZOX:WRUE, appropriate incisional tenderness w/o peritonitis, ostomy pink w some sweat.  JP serous fluid Lab Results: CBC  Recent Labs    12/31/22 0508 01/01/23 0439  WBC 16.4* 16.2*  HGB 12.4 12.5  HCT 37.7 37.8  PLT 255 291   BMET Recent Labs    12/31/22 0508 01/01/23 0439  NA 134* 133*  K 3.8 4.1  CL 99 100  CO2 25 24  GLUCOSE 133* 141*  BUN 6* 12  CREATININE 0.68 0.71  CALCIUM 8.6* 7.9*   PT/INR No results for input(s): "LABPROT", "INR" in the last 72 hours. ABG No results for input(s): "PHART", "HCO3" in the last 72 hours.  Invalid input(s): "PCO2", "PO2"  Studies/Results: No results found.  Anti-infectives: Anti-infectives (From admission, onward)    Start     Dose/Rate Route Frequency Ordered Stop   12/29/22 1100  piperacillin-tazobactam (ZOSYN) IVPB 3.375 g        3.375 g 12.5 mL/hr over 240 Minutes Intravenous Every 8 hours 12/29/22 1045 01/05/23 0759   12/29/22 0745  piperacillin-tazobactam (ZOSYN) IVPB 3.375 g        3.375 g 100 mL/hr over 30 Minutes Intravenous  Once 12/29/22 0744 12/29/22 0933       Assessment/Plan:  Doing well Continue a/bs DC [place] Fulls Mobilize Pulm toilet  Sterling Big, MD, FACS  01/01/2023

## 2023-01-02 LAB — BASIC METABOLIC PANEL
Anion gap: 9 (ref 5–15)
BUN: 17 mg/dL (ref 8–23)
CO2: 26 mmol/L (ref 22–32)
Calcium: 8.3 mg/dL — ABNORMAL LOW (ref 8.9–10.3)
Chloride: 100 mmol/L (ref 98–111)
Creatinine, Ser: 0.77 mg/dL (ref 0.44–1.00)
GFR, Estimated: >60 mL/min (ref >60)
Glucose, Bld: 103 mg/dL — ABNORMAL HIGH (ref 70–99)
Potassium: 3.3 mmol/L — ABNORMAL LOW (ref 3.5–5.1)
Sodium: 135 mmol/L (ref 135–145)

## 2023-01-02 LAB — CBC
HCT: 37.3 % (ref 36.0–46.0)
Hemoglobin: 12.2 g/dL (ref 12.0–15.0)
MCH: 29 pg (ref 26.0–34.0)
MCHC: 32.7 g/dL (ref 30.0–36.0)
MCV: 88.6 fL (ref 80.0–100.0)
Platelets: 335 10*3/uL (ref 150–400)
RBC: 4.21 MIL/uL (ref 3.87–5.11)
RDW: 12.6 % (ref 11.5–15.5)
WBC: 14.1 10*3/uL — ABNORMAL HIGH (ref 4.0–10.5)
nRBC: 0 % (ref 0.0–0.2)

## 2023-01-02 MED ORDER — POTASSIUM CHLORIDE CRYS ER 20 MEQ PO TBCR
20.0000 meq | EXTENDED_RELEASE_TABLET | Freq: Two times a day (BID) | ORAL | Status: DC
  Administered 2023-01-02 – 2023-01-06 (×7): 20 meq via ORAL
  Filled 2023-01-02 (×7): qty 1

## 2023-01-02 NOTE — Progress Notes (Signed)
[facility] SURGICAL PROGRESS NOTE  Hospital Day(s): 4.   Post op day(s): 2 Days Post-Op.   Interval History:  Patient seen and examined No acute events or new complaints overnight.  Patient reports she is doing well; abdominal soreness No fever, chills, nausea, emesis Leukocytosis is making improvements; now 14.1K (from 16.2K) Hgb to 12.2 Renal function normal; sCr - 0.77; UO - 400 ccs Hypokalemia to 3.3 Surgical drain with 110 ccs out in last 24 hours; serosanguinous FLD; tolerating Ostomy with gas in bag this AM Zosyn  Ambulating without issue  Vital signs in last 24 hours: [min-max] current  Temp:  [97.5 F (36.4 C)-98.6 F (37 C)] 98.6 F (37 C) (10/28 0542) Pulse Rate:  [59-69] 60 (10/28 0707) Resp:  [16-18] 16 (10/28 0542) BP: (145-196)/(62-82) 177/78 (10/28 0707) SpO2:  [93 %-98 %] 93 % (10/28 0542)     Height: 5\' 4"  (162.6 cm) Weight: 85.7 kg BMI (Calculated): 32.43   Intake/Output last 2 shifts:  10/27 0701 - 10/28 0700 In: 0  Out: 510 [Urine:400; Drains:110]   Physical Exam:  Constitutional: alert, cooperative and no distress  Respiratory: breathing non-labored at rest  Cardiovascular: regular rate and sinus rhythm  Gastrointestinal: soft, non-tender, and non-distended, no rebound/guarding. Colostomy in left mid-abdomen; gas in bag. Drain in right abdomen; output serosanguinous Integumentary: Laparotomy is CDI with staples and penrose, no erythema, scant drainage  Labs:     Latest Ref Rng & Units 01/02/2023    5:26 AM 01/01/2023    4:39 AM 12/31/2022    5:08 AM  CBC  WBC 4.0 - 10.5 K/uL 14.1  16.2  16.4   Hemoglobin 12.0 - 15.0 g/dL 82.9  56.2  13.0   Hematocrit 36.0 - 46.0 % 37.3  37.8  37.7   Platelets 150 - 400 K/uL 335  291  255       Latest Ref Rng & Units 01/02/2023    5:26 AM 01/01/2023    4:39 AM 12/31/2022    5:08 AM  CMP  Glucose 70 - 99 mg/dL 865  784  696   BUN 8 - 23 mg/dL 17  12  6    Creatinine 0.44 - 1.00 mg/dL  2.95  2.84  1.32   Sodium 135 - 145 mmol/L 135  133  134   Potassium 3.5 - 5.1 mmol/L 3.3  4.1  3.8   Chloride 98 - 111 mmol/L 100  100  99   CO2 22 - 32 mmol/L 26  24  25    Calcium 8.9 - 10.3 mg/dL 8.3  7.9  8.6      Imaging studies: No new pertinent imaging studies   Assessment/Plan:  67 y.o. female 2 Days Post-Op s/p Hartman's procedure for perforated diverticulitis.   - Will advance to soft diet  - Continue IV Abx (Zosyn); certainly go home on PO antibiotics   - Will replete K; monitor   - Continue surgical drain; monitor and record output   - Monitor abdominal examination - Pain control prn; antiemetics prn - Awaiting WOC RN evaluation today for ostomy teaching/care - Mobilize; doing well  - Discharge Planning: She is doing well. Diet advancing. Having bowel function. Awaiting WOC RN evaluation today. If continues to do well, anticipate DC in next 24-48 hours.    All of the above findings and recommendations were discussed with the patient, and the medical team, and all of patient's questions were answered to her expressed satisfaction.  -- Lynden Oxford, PA-C [facility] 01/02/2023, 7:38 AM M-F: 7am - 4pm

## 2023-01-02 NOTE — Plan of Care (Signed)
  Problem: Education: Goal: Knowledge of General Education information will improve Description: Including pain rating scale, medication(s)/side effects and non-pharmacologic comfort measures Outcome: Progressing   Problem: Health Behavior/Discharge Planning: Goal: Ability to manage health-related needs will improve Outcome: Progressing   Problem: Clinical Measurements: Goal: Ability to maintain clinical measurements within normal limits will improve Outcome: Progressing Goal: Will remain free from infection Outcome: Progressing Goal: Diagnostic test results will improve Outcome: Progressing Goal: Respiratory complications will improve Outcome: Progressing Goal: Cardiovascular complication will be avoided Outcome: Progressing   Problem: Activity: Goal: Risk for activity intolerance will decrease Outcome: Progressing   Problem: Nutrition: Goal: Adequate nutrition will be maintained Outcome: Progressing   Problem: Coping: Goal: Level of anxiety will decrease Outcome: Progressing   Problem: Elimination: Goal: Will not experience complications related to bowel motility Outcome: Progressing Goal: Will not experience complications related to urinary retention Outcome: Progressing   Problem: Pain Management: Goal: General experience of comfort will improve Outcome: Progressing   Problem: Safety: Goal: Ability to remain free from injury will improve Outcome: Progressing   Problem: Skin Integrity: Goal: Risk for impaired skin integrity will decrease Outcome: Progressing   Problem: Education: Goal: Knowledge of ostomy care will improve Outcome: Progressing Goal: Understanding of discharge needs will improve Outcome: Progressing   Problem: Bowel/Gastric/Urinary: Goal: Gastrointestinal status for postoperative course will improve Outcome: Progressing   Problem: Coping: Goal: Coping ability will improve Outcome: Progressing   Problem: Fluid Volume: Goal: Ability  to achieve a balanced intake and output will improve Outcome: Progressing   Problem: Health Behavior/Discharge Planning: Goal: Ability to manage health-related needs will improve Outcome: Progressing   Problem: Nutrition: Goal: Will attain and maintain optimal nutritional status will improve Outcome: Progressing   Problem: Clinical Measurements: Goal: Postoperative complications will be avoided or minimized Outcome: Progressing   Problem: Skin Integrity: Goal: Will show signs of wound healing Outcome: Progressing Goal: Risk for impaired skin integrity will decrease Outcome: Progressing

## 2023-01-02 NOTE — Plan of Care (Signed)

## 2023-01-02 NOTE — Consult Note (Signed)
WOC Nurse ostomy consult note Patient available for teaching at this time Stoma type/location: LMQ colostomy Stomal assessment/size: 1 3/8" budded pink and moist  Slightly dusky on stoma edge a mucocutaneous junction from 7 to 11 o'clock Peristomal assessment: intact  staple line midline that is oozing a bit  Had to trim a portion of honeycomb dressing away to remove pouch.   Treatment options for stomal/peristomal skin: 2 piece pouch  Output blood tinged liquid  Ostomy pouching: 2pc. Pouch  2 1/4"   Education provided: POuch change performed.  Discussed twice weekly pouch changes, emptying when 1/3 full.  Measuring stoma and cutting to fit.  Patient able to assemble pouch and barrier together.  We apply pouch together.  Pouch rolled closed.  Patient understands she will empty once stooling begins.  Written folder given on life with a colostomy and information on the outpatient ostomy clinic in [place] [facility]  Enrolled [place] in [place] [facility] [place] [facility] [place] DC program: Yes will today Will follow.  Mike Gip MSN, RN, FNP-BC CWON Wound, Ostomy, Continence Nurse Outpatient [facility] [phone] Pager [phone]

## 2023-01-02 NOTE — TOC Progression Note (Signed)
Transition of Care [facility]) - Progression Note    Patient Details  Name: Michele Sanders MRN: [id number] Date of Birth: 1955/09/13  Transition of Care [facility] CM/SW Contact  Chapman Fitch, RN Phone Number: 01/02/2023, 1:57 PM  Clinical Narrative:     Met with patient at bedside and discussed home health. Patient in agreement for home health.  States she does not have a preference of home health agency.  CMS Medicare.gov Compare Post Acute Care list reviewed with patient. Referral made to [facility] with [facility]   Received messages to call daughter [name]. Patient in agreement for me to call daughter.  [name] updated on plan.  She would like to be seen by PT and OT to determine if there are any additional needs for home.  PT OT pending   Expected Discharge Plan: Home w Home Health Services Barriers to Discharge: Continued Medical Work up  Expected Discharge Plan and Services     Post Acute Care Choice: Home Health Living arrangements for the past 2 months: Single Family Home                           HH Arranged: RN [facility] Agency: [facility] Date [facility] Agency Contacted: 01/02/23   Representative spoke with at [facility] Agency: Kandee Keen   Social Determinants of Health (SDOH) Interventions SDOH Screenings   Food Insecurity: No Food Insecurity (12/29/2022)  Housing: Low Risk  (12/29/2022)  Transportation Needs: No Transportation Needs (12/29/2022)  Utilities: Not At Risk (12/29/2022)  Financial Resource Strain: Low Risk  (12/12/2022)   Received from [facility]  Tobacco Use: High Risk (12/29/2022)    Readmission Risk Interventions     No data to display

## 2023-01-02 NOTE — Consult Note (Signed)
WOC Nurse ostomy consult note Third attempt at ostomy teaching with patient today. She is now booking plane flights and is not available to speak to me.  The ostomy nurse is only covering this campus until 1100 and my first attempt to her was at 9:15, she was washing her hair.  The next attempt she was eating breakfast.  Unclear when a time that both are available for teaching and learning may happen.  Will follow.  Mike Gip MSN, RN, FNP-BC CWON Wound, Ostomy, Continence Nurse Outpatient [facility] [phone] Pager [phone]

## 2023-01-03 ENCOUNTER — Inpatient Hospital Stay: Payer: Medicare HMO

## 2023-01-03 LAB — BASIC METABOLIC PANEL
Anion gap: 10 (ref 5–15)
BUN: 21 mg/dL (ref 8–23)
CO2: 27 mmol/L (ref 22–32)
Calcium: 8.5 mg/dL — ABNORMAL LOW (ref 8.9–10.3)
Chloride: 99 mmol/L (ref 98–111)
Creatinine, Ser: 0.79 mg/dL (ref 0.44–1.00)
GFR, Estimated: >60 mL/min (ref >60)
Glucose, Bld: 132 mg/dL — ABNORMAL HIGH (ref 70–99)
Potassium: 3.3 mmol/L — ABNORMAL LOW (ref 3.5–5.1)
Sodium: 136 mmol/L (ref 135–145)

## 2023-01-03 LAB — CULTURE, BLOOD (ROUTINE X 2)
Culture: NO GROWTH
Culture: NO GROWTH
Special Requests: ADEQUATE

## 2023-01-03 LAB — CBC
HCT: 40.7 % (ref 36.0–46.0)
Hemoglobin: 13.3 g/dL (ref 12.0–15.0)
MCH: 29.1 pg (ref 26.0–34.0)
MCHC: 32.7 g/dL (ref 30.0–36.0)
MCV: 89.1 fL (ref 80.0–100.0)
Platelets: 397 10*3/uL (ref 150–400)
RBC: 4.57 MIL/uL (ref 3.87–5.11)
RDW: 12.9 % (ref 11.5–15.5)
WBC: 12.8 10*3/uL — ABNORMAL HIGH (ref 4.0–10.5)
nRBC: 0 % (ref 0.0–0.2)

## 2023-01-03 LAB — SURGICAL PATHOLOGY

## 2023-01-03 MED ORDER — KCL IN DEXTROSE-NACL 20-5-0.45 MEQ/L-%-% IV SOLN
INTRAVENOUS | Status: AC
  Filled 2023-01-03 (×3): qty 1000

## 2023-01-03 MED ORDER — LISINOPRIL 20 MG PO TABS
20.0000 mg | ORAL_TABLET | Freq: Every day | ORAL | Status: DC
  Administered 2023-01-03 – 2023-01-06 (×3): 20 mg via ORAL
  Filled 2023-01-03 (×3): qty 1

## 2023-01-03 MED ORDER — SILVER NITRATE-POT NITRATE 75-25 % EX MISC
2.0000 | Freq: Once | CUTANEOUS | Status: AC
  Administered 2023-01-03: 2 via TOPICAL
  Filled 2023-01-03: qty 2

## 2023-01-03 MED ORDER — SODIUM CHLORIDE 0.9 % IV SOLN
12.5000 mg | Freq: Four times a day (QID) | INTRAVENOUS | Status: DC | PRN
  Administered 2023-01-03: 12.5 mg via INTRAVENOUS
  Filled 2023-01-03: qty 12.5

## 2023-01-03 MED ORDER — POTASSIUM CHLORIDE CRYS ER 20 MEQ PO TBCR: 40.0000 meq | EXTENDED_RELEASE_TABLET | Freq: Once | ORAL | Status: DC

## 2023-01-03 MED ORDER — SIMVASTATIN 20 MG PO TABS
40.0000 mg | ORAL_TABLET | Freq: Every day | ORAL | Status: DC
  Administered 2023-01-04 – 2023-01-05 (×2): 40 mg via ORAL
  Filled 2023-01-03 (×3): qty 2

## 2023-01-03 MED ORDER — ACETAMINOPHEN 10 MG/ML IV SOLN
1000.0000 mg | Freq: Four times a day (QID) | INTRAVENOUS | Status: AC
  Administered 2023-01-03 – 2023-01-04 (×3): 1000 mg via INTRAVENOUS
  Filled 2023-01-03 (×4): qty 100

## 2023-01-03 MED ORDER — HYDRALAZINE HCL 20 MG/ML IJ SOLN: 5.0000 mg | Freq: Four times a day (QID) | INTRAMUSCULAR | Status: DC | PRN

## 2023-01-03 NOTE — Progress Notes (Signed)
Surgeon Notified: RN attempted to place NG tube 18 Fr but her nostrials are narrow I attempted both nares to see if I could place it. She refuses to keep trying and reports she only wants it done under anesthesia. I mention to her that there is lidocaine we could possibly order to try again but she is refusing she reports she is done and does not want to keep trying.

## 2023-01-03 NOTE — TOC Progression Note (Addendum)
Transition of Care [facility]) - Progression Note    Patient Details  Name: Michele Sanders MRN: [id number] Date of Birth: November 10, 1955  Transition of Care [facility]) CM/SW Contact  Margarito Liner, LCSW Phone Number: 01/03/2023, 11:06 AM  Clinical Narrative:  [facility] can add OT. PT evaluation recommendation for no follow up.   11:36 am: Patient declined 3-in-1.  Expected Discharge Plan: Home w Home Health Services Barriers to Discharge: Continued Medical Work up  Expected Discharge Plan and Services     Post Acute Care Choice: Home Health Living arrangements for the past 2 months: Single Family Home                           HH Arranged: RN [facility] Agency: [facility] Date [facility] Agency Contacted: 01/02/23   Representative spoke with at [facility] Agency: Kandee Keen   Social Determinants of Health (SDOH) Interventions SDOH Screenings   Food Insecurity: No Food Insecurity (12/29/2022)  Housing: Low Risk  (12/29/2022)  Transportation Needs: No Transportation Needs (12/29/2022)  Utilities: Not At Risk (12/29/2022)  Financial Resource Strain: Low Risk  (12/12/2022)   Received from [facility]  Tobacco Use: High Risk (12/29/2022)    Readmission Risk Interventions     No data to display

## 2023-01-03 NOTE — Evaluation (Signed)
Occupational Therapy Evaluation Patient Details Name: Michele Sanders MRN: [id number] DOB: [birth year] Today's Date: 01/03/2023   History of Present Illness 67 year old female with evidence of acute perforated diverticulitis with moderate amount of free air patient admitted by Dr. Tonna Boehringer on 12/29/2022. Pt is now s/p hartmann's procedure (colostomy) POD #3.   Clinical Impression   Pt was seen for OT evaluation this date. Prior to hospital admission, pt was living at home with her husband. She was IND in regard to ADL function and mobility.   Pt presents to acute OT demonstrating impaired ADL performance and functional mobility 2/2 pain, weakness and limited activity tolerance due to nausea (See OT problem list for additional functional deficits). Pt currently requires SUP for bed mobility with cueing for log rolling technique. She required SUP/SBA for STS from EOB and pt took ~6-7 steps in the room to get to the sink and turn around and go back to [facility]. She had just recently walked with nursing and for PT evaluation and reports being nauseous and tired. Pt able to slide on her slippers with SUP at EOB, woould overall require Mod A for other LB ADLs-donning pants/underwear/socks and LB bathing tasks at this time. Reports her husband and sister in law will be able to assist. Recommend 3-in-1 commode to place over her low toilet at home or toilet riser with handles. Pt in agreement. Has reacher and shoehorn at home she already knows how to use. Educated on long handled sponge use and on places to purchase as well. Pt would benefit from skilled OT services to address noted impairments and functional limitations (see below for any additional details) in order to maximize safety and independence while minimizing falls risk and caregiver burden. Recommend HHOT consult for home environment assessment/modification or further DME recommendations.     If plan is discharge home, recommend the following: A little help  with bathing/dressing/bathroom;Assistance with cooking/housework;Help with stairs or ramp for entrance    Functional Status Assessment  Patient has had a recent decline in their functional status and demonstrates the ability to make significant improvements in function in a reasonable and predictable amount of time.  Equipment Recommendations  BSC/3in1;Toilet rise with handles    Recommendations for Other Services       Precautions / Restrictions Precautions Precautions: Fall Precaution Comments: log rolling for abdominal precaution Restrictions Weight Bearing Restrictions: No      Mobility Bed Mobility Overal bed mobility: Needs Assistance Bed Mobility: Sit to Sidelying, Sidelying to Sit   Sidelying to sit: Supervision, HOB elevated, Used rails     Sit to sidelying: Supervision, HOB elevated, Used rails General bed mobility comments: cueing for log rolling technique but generally SUP    Transfers Overall transfer level: Needs assistance   Transfers: Sit to/from Stand Sit to Stand: Supervision           General transfer comment: SUP for STS from EOB and able to take a few steps forward to sink and turn around and step back to [facility] with SUP/SBA and no LOB; limited session d/t nausea and fatigue      Balance Overall balance assessment: Mild deficits observed, not formally tested                                         ADL either performed or assessed with clinical judgement   ADL Overall ADL's : Needs assistance/impaired  Lower Body Bathing Details (indicate cue type and reason): simulated     Lower Body Dressing: Sitting/lateral leans;Sit to/from stand;Moderate assistance Lower Body Dressing Details (indicate cue type and reason): simluated and needing Mod A below the knee; able to slip on slide on shoes with SUP seated at EOB             Functional mobility during ADLs: Supervision/safety       Vision          Perception         Praxis         Pertinent Vitals/Pain Pain Assessment Pain Assessment: 0-10 Pain Score: 4  Pain Location: no pain initially and mostly throughout; sharp stabbing pain to R abdomen upon return to bed Pain Descriptors / Indicators: Sharp, Stabbing Pain Intervention(s): Monitored during session     Extremity/Trunk Assessment Upper Extremity Assessment Upper Extremity Assessment: Generalized weakness   Lower Extremity Assessment Lower Extremity Assessment: Generalized weakness       Communication Communication Communication: No apparent difficulties   Cognition Arousal: Alert Behavior During Therapy: WFL for tasks assessed/performed Overall Cognitive Status: Within Functional Limits for tasks assessed                                       General Comments  pt reports nausea and fatigue    Exercises Other Exercises Other Exercises: Edu on role of OT, AE/AD/DME and purpose of use with husband and pt for use at home.   Shoulder Instructions      Home Living Family/patient expects to be discharged to:: Private residence Living Arrangements: Spouse/significant other Available Help at Discharge: Family Type of Home: House             Bathroom Shower/Tub: Walk-in Soil scientist Toilet: Standard     Home Equipment: Information systems manager   Additional Comments: reacher, shoe horn      Prior Functioning/Environment Prior Level of Function : Independent/Modified Independent                        OT Problem List: Decreased strength;Pain;Decreased activity tolerance      OT Treatment/Interventions: Self-care/ADL training;Therapeutic exercise;Patient/family education;Balance training;Energy conservation;Therapeutic activities;DME and/or AE instruction    OT Goals(Current goals can be found in the care plan section) Acute Rehab OT Goals Patient Stated Goal: return home with improved nausea OT Goal Formulation: With  patient/family Time For Goal Achievement: 01/17/23 Potential to Achieve Goals: Good ADL Goals Pt Will Perform Lower Body Bathing: sitting/lateral leans;sit to/from stand;with contact guard assist Pt Will Perform Lower Body Dressing: with contact guard assist;sit to/from stand;sitting/lateral leans Pt Will Transfer to Toilet: with modified independence;bedside commode;ambulating Pt Will Perform Toileting - Clothing Manipulation and hygiene: with modified independence;sitting/lateral leans;sit to/from stand Additional ADL Goal #1: Pt will demo all bed mobility with IND following log rolling techniques to prevent pain/abdominal irritation.  OT Frequency: Min 1X/week    Co-evaluation              AM-PAC OT "6 Clicks" Daily Activity     Outcome Measure Help from another person eating meals?: None Help from another person taking care of personal grooming?: None Help from another person toileting, which includes using toliet, bedpan, or urinal?: A Little Help from another person bathing (including washing, rinsing, drying)?: A Lot Help from another person to put on and taking off  regular upper body clothing?: A Little Help from another person to put on and taking off regular lower body clothing?: A Lot 6 Click Score: 18   End of Session    Activity Tolerance: Patient tolerated treatment well;Patient limited by fatigue (limited by nausea) Patient left: in bed;with call bell/phone within reach;with family/visitor present  OT Visit Diagnosis: Other abnormalities of gait and mobility (R26.89);Muscle weakness (generalized) (M62.81);Pain                Time: 1610-9604 OT Time Calculation (min): 23 min Charges:  OT General Charges $OT Visit: 1 Visit OT Evaluation $OT Eval Low Complexity: 1 Low OT Treatments $Therapeutic Activity: 8-22 mins Ryann Leavitt, OTR/L 01/03/23, 10:14 AM Son Barkan E Kaylan Friedmann 01/03/2023, 10:09 AM

## 2023-01-03 NOTE — Progress Notes (Signed)
The patient had a KUB. IV fluids initiated and strict NPO order have been started.

## 2023-01-03 NOTE — Evaluation (Signed)
Physical Therapy Evaluation Patient Details Name: Michele Sanders MRN: [id number] DOB: Aug 23, 1955 Today's Date: 01/03/2023  History of Present Illness  Patient is a 67 year old female s/p Hartman's procedure for perforated diverticulitis.  Clinical Impression  Patient agreeable to PT evaluation. She is independent at baseline and lives with her spouse. She will have several family members available to assist her as needed at home.  Educated patient today on logroll technique for safety and comfort of abdominal incision. Patient did need minimal assistance for her trunk to sit upright with the head of bed flat to simulate home environment. Patient ambulated in hallway without assistive device with cues for posture. She was able to complete a lap without assistance but she does report fatigue and mild nausea. Recommend to continue PT while in the hospital. No PT needs anticipated after this hospital stay.       If plan is discharge home, recommend the following: Assist for transportation;Help with stairs or ramp for entrance   Can travel by private vehicle        Equipment Recommendations BSC/3in1  Recommendations for Other Services       Functional Status Assessment Patient has had a recent decline in their functional status and demonstrates the ability to make significant improvements in function in a reasonable and predictable amount of time.     Precautions / Restrictions Precautions Precautions: Fall Precaution Comments: logroll for comfort Restrictions Weight Bearing Restrictions: No      Mobility  Bed Mobility Overal bed mobility: Needs Assistance Bed Mobility: Sidelying to Sit, Sit to Sidelying   Sidelying to sit: Min assist (HOB flat)     Sit to sidelying: Supervision (HOB flat) General bed mobility comments: HOB was flat to simulate home environment. Min A was required for trunk elevation. cues for logroll technique for safety and comfort    Transfers Overall  transfer level: Needs assistance   Transfers: Sit to/from Stand Sit to Stand: Supervision                Ambulation/Gait Ambulation/Gait assistance: Supervision Gait Distance (Feet): 175 Feet Assistive device: None Gait Pattern/deviations: Step-through pattern Gait velocity: decreased     General Gait Details: educated patient on importance for upright posture and to avoid trunk flexion with ambualtion, patient demonstrated correct technique. no loss of balance without assistive device but activity tolerance is limited by fatigue and nausea  Stairs            Wheelchair Mobility     Tilt Bed    Modified Rankin (Stroke Patients Only)       Balance Overall balance assessment: No apparent balance deficits (not formally assessed)                                           Pertinent Vitals/Pain Pain Assessment Pain Assessment: Faces Faces Pain Scale: Hurts little more Pain Location: abdomen Pain Descriptors / Indicators: Discomfort Pain Intervention(s): Limited activity within patient's tolerance, Monitored during session, Repositioned    Home Living Family/patient expects to be discharged to:: Private residence Living Arrangements: Spouse/significant other Available Help at Discharge: Family Type of Home: House           Home Equipment: Shower seat Additional Comments: reacher, shoe horn    Prior Function Prior Level of Function : Independent/Modified Independent  Extremity/Trunk Assessment   Upper Extremity Assessment Upper Extremity Assessment: Generalized weakness    Lower Extremity Assessment Lower Extremity Assessment: Generalized weakness       Communication   Communication Communication: No apparent difficulties  Cognition Arousal: Alert Behavior During Therapy: WFL for tasks assessed/performed Overall Cognitive Status: Within Functional Limits for tasks assessed                                           General Comments General comments (skin integrity, edema, etc.): pt reports nausea and fatigue    Exercises     Assessment/Plan    PT Assessment Patient needs continued PT services  PT Problem List Decreased strength;Decreased activity tolerance;Decreased range of motion;Decreased mobility;Decreased balance       PT Treatment Interventions DME instruction;Gait training;Stair training;Functional mobility training;Therapeutic activities;Therapeutic exercise;Balance training;Neuromuscular re-education    PT Goals (Current goals can be found in the Care Plan section)  Acute Rehab PT Goals Patient Stated Goal: to go home PT Goal Formulation: With patient Time For Goal Achievement: 01/17/23 Potential to Achieve Goals: Good    Frequency Min 1X/week     Co-evaluation               AM-PAC PT "6 Clicks" Mobility  Outcome Measure Help needed turning from your back to your side while in a flat bed without using bedrails?: None Help needed moving from lying on your back to sitting on the side of a flat bed without using bedrails?: A Little Help needed moving to and from a bed to a chair (including a wheelchair)?: A Little Help needed standing up from a chair using your arms (e.g., wheelchair or bedside chair)?: A Little Help needed to walk in hospital room?: A Little Help needed climbing 3-5 steps with a railing? : A Little 6 Click Score: 19    End of Session   Activity Tolerance: Patient tolerated treatment well Patient left: in bed;with call bell/phone within reach;with bed alarm set Nurse Communication: Mobility status PT Visit Diagnosis: Muscle weakness (generalized) (M62.81);Difficulty in walking, not elsewhere classified (R26.2)    Time: 1610-9604 PT Time Calculation (min) (ACUTE ONLY): 20 min   Charges:   PT Evaluation $PT Eval Low Complexity: 1 Low PT Treatments $Therapeutic Activity: 8-22 mins PT General Charges $$ ACUTE  PT VISIT: 1 Visit         Donna Bernard, PT, MPT   Ina Homes 01/03/2023, 10:25 AM

## 2023-01-03 NOTE — Progress Notes (Signed)
[facility] SURGICAL PROGRESS NOTE  Hospital Day(s): 5.   Post op day(s): 3 Days Post-Op.   Interval History:  Patient seen and examined No acute events or new complaints overnight.  Patient now having more nausea today; no emesis No fever, chills  Leukocytosis is making improvements; now 12.8K Hgb to 13.3 Renal function normal; sCr - 0.79; UO - 400 ccs Hypokalemia to 3.3 Surgical drain with 225 ccs out in last 24 hours; serosanguinous Diet had been advanced to soft diet  Ostomy with gas in bag this AM Zosyn  Ambulating without issue  Vital signs in last 24 hours: [min-max] current  Temp:  [97.5 F (36.4 C)-98.2 F (36.8 C)] 98.2 F (36.8 C) (10/29 0814) Pulse Rate:  [66-77] 77 (10/29 0814) Resp:  [18] 18 (10/29 0814) BP: (162-194)/(78-90) 183/90 (10/29 0816) SpO2:  [93 %-95 %] 94 % (10/29 0814)     Height: 5\' 4"  (162.6 cm) Weight: 85.7 kg BMI (Calculated): 32.43   Intake/Output last 2 shifts:  10/28 0701 - 10/29 0700 In: 720 [P.O.:720] Out: 300 [Drains:225; Stool:75]   Physical Exam:  Constitutional: alert, cooperative and no distress  Respiratory: breathing non-labored at rest  Cardiovascular: regular rate and sinus rhythm  Gastrointestinal: soft, non-tender, and non-distended, no rebound/guarding. Colostomy in left mid-abdomen; gas in bag. Drain in right abdomen; output serosanguinous Integumentary: Laparotomy is in tact with staples; she is developing oozing from superior portion of the wound. A few staples removed from the wound in this area. There was a small area of oozing from the skin that was identified, silver nitrate applied.   Labs:     Latest Ref Rng & Units 01/03/2023    5:03 AM 01/02/2023    5:26 AM 01/01/2023    4:39 AM  CBC  WBC 4.0 - 10.5 K/uL 12.8  14.1  16.2   Hemoglobin 12.0 - 15.0 g/dL 16.1  09.6  04.5   Hematocrit 36.0 - 46.0 % 40.7  37.3  37.8   Platelets 150 - 400 K/uL 397  335  291       Latest Ref Rng & Units  01/03/2023    5:03 AM 01/02/2023    5:26 AM 01/01/2023    4:39 AM  CMP  Glucose 70 - 99 mg/dL 409  811  914   BUN 8 - 23 mg/dL 21  17  12    Creatinine 0.44 - 1.00 mg/dL 7.82  9.56  2.13   Sodium 135 - 145 mmol/L 136  135  133   Potassium 3.5 - 5.1 mmol/L 3.3  3.3  4.1   Chloride 98 - 111 mmol/L 99  100  100   CO2 22 - 32 mmol/L 27  26  24    Calcium 8.9 - 10.3 mg/dL 8.5  8.3  7.9      Imaging studies: No new pertinent imaging studies   Assessment/Plan:  67 y.o. female with likely developing post-operative ileus 3 Days Post-Op s/p Hartman's procedure for perforated diverticulitis.   - Will back down to NPO today and monitor  - Discussed role for NGT if emesis develops; can hold off for now  - Will get morning labs & KUB - Continue IV Abx (Zosyn)  - Will replete K; monitor   - Continue surgical drain; monitor and record output   - Monitor abdominal examination - Pain control prn; antiemetics prn - WOC RN evaluation on-board for ostomy teaching/care - Mobilize; doing well  - Discharge Planning: Question of developing ileus, not  ready    All of the above findings and recommendations were discussed with the patient, and the medical team, and all of patient's questions were answered to her expressed satisfaction.  -- Lynden Oxford, PA-C [facility] 01/03/2023, 1:30 PM M-F: 7am - 4pm

## 2023-01-04 ENCOUNTER — Inpatient Hospital Stay: Payer: Medicare HMO

## 2023-01-04 LAB — BASIC METABOLIC PANEL
Anion gap: 9 (ref 5–15)
BUN: 19 mg/dL (ref 8–23)
CO2: 28 mmol/L (ref 22–32)
Calcium: 8.3 mg/dL — ABNORMAL LOW (ref 8.9–10.3)
Chloride: 101 mmol/L (ref 98–111)
Creatinine, Ser: 0.77 mg/dL (ref 0.44–1.00)
GFR, Estimated: >60 mL/min (ref >60)
Glucose, Bld: 114 mg/dL — ABNORMAL HIGH (ref 70–99)
Potassium: 3.6 mmol/L (ref 3.5–5.1)
Sodium: 138 mmol/L (ref 135–145)

## 2023-01-04 LAB — CBC
HCT: 40.4 % (ref 36.0–46.0)
Hemoglobin: 13.1 g/dL (ref 12.0–15.0)
MCH: 28.9 pg (ref 26.0–34.0)
MCHC: 32.4 g/dL (ref 30.0–36.0)
MCV: 89 fL (ref 80.0–100.0)
Platelets: 427 10*3/uL — ABNORMAL HIGH (ref 150–400)
RBC: 4.54 MIL/uL (ref 3.87–5.11)
RDW: 13 % (ref 11.5–15.5)
WBC: 14.5 10*3/uL — ABNORMAL HIGH (ref 4.0–10.5)
nRBC: 0 % (ref 0.0–0.2)

## 2023-01-04 LAB — MAGNESIUM: Magnesium: 2.1 mg/dL (ref 1.7–2.4)

## 2023-01-04 NOTE — Plan of Care (Signed)
  Problem: Education: Goal: Knowledge of General Education information will improve Description: Including pain rating scale, medication(s)/side effects and non-pharmacologic comfort measures Outcome: Progressing   Problem: Health Behavior/Discharge Planning: Goal: Ability to manage health-related needs will improve Outcome: Progressing   Problem: Clinical Measurements: Goal: Ability to maintain clinical measurements within normal limits will improve Outcome: Progressing Goal: Will remain free from infection Outcome: Progressing Goal: Diagnostic test results will improve Outcome: Progressing Goal: Respiratory complications will improve Outcome: Progressing Goal: Cardiovascular complication will be avoided Outcome: Progressing   Problem: Activity: Goal: Risk for activity intolerance will decrease Outcome: Progressing   Problem: Nutrition: Goal: Adequate nutrition will be maintained Outcome: Progressing   Problem: Coping: Goal: Level of anxiety will decrease Outcome: Progressing   Problem: Elimination: Goal: Will not experience complications related to bowel motility Outcome: Progressing Goal: Will not experience complications related to urinary retention Outcome: Progressing   Problem: Pain Management: Goal: General experience of comfort will improve Outcome: Progressing   Problem: Safety: Goal: Ability to remain free from injury will improve Outcome: Progressing   Problem: Skin Integrity: Goal: Risk for impaired skin integrity will decrease Outcome: Progressing   Problem: Education: Goal: Knowledge of ostomy care will improve Outcome: Progressing Goal: Understanding of discharge needs will improve Outcome: Progressing   Problem: Bowel/Gastric/Urinary: Goal: Gastrointestinal status for postoperative course will improve Outcome: Progressing   Problem: Coping: Goal: Coping ability will improve Outcome: Progressing   Problem: Fluid Volume: Goal: Ability  to achieve a balanced intake and output will improve Outcome: Progressing   Problem: Health Behavior/Discharge Planning: Goal: Ability to manage health-related needs will improve Outcome: Progressing   Problem: Nutrition: Goal: Will attain and maintain optimal nutritional status will improve Outcome: Progressing   Problem: Clinical Measurements: Goal: Postoperative complications will be avoided or minimized Outcome: Progressing   Problem: Skin Integrity: Goal: Will show signs of wound healing Outcome: Progressing Goal: Risk for impaired skin integrity will decrease Outcome: Progressing

## 2023-01-04 NOTE — Progress Notes (Signed)
CC: s/p hartmann's POD # 4 Subjective: Ileus yesterday, declined NG. TOday feels better, still w gas and feces from ostomy KUB w ileus today a bit better Some bleeding controlled w silver nitrate Ostomy pink patent Ambulating in the halls  Objective: Vital signs in last 24 hours: Temp:  [98.3 F (36.8 C)-98.5 F (36.9 C)] 98.5 F (36.9 C) (10/30 0917) Pulse Rate:  [71-110] 71 (10/30 0917) Resp:  [16-18] 16 (10/30 0917) BP: (150-160)/(76-100) 160/82 (10/30 0917) SpO2:  [90 %-95 %] 93 % (10/30 0917) Last BM Date : 12/29/22  Intake/Output from previous day: 10/29 0701 - 10/30 0700 In: 290 [P.O.:240; IV Piggyback:50] Out: 100 [Drains:100] Intake/Output this shift: No intake/output data recorded.  Physical exam:  NAD  Abd: soft, mildly distended ,no peritonitis. No infection or active bleeding. Drain serous  Lab Results: CBC  Recent Labs    01/03/23 0503 01/04/23 0530  WBC 12.8* 14.5*  HGB 13.3 13.1  HCT 40.7 40.4  PLT 397 427*   BMET Recent Labs    01/03/23 0503 01/04/23 0530  NA 136 138  K 3.3* 3.6  CL 99 101  CO2 27 28  GLUCOSE 132* 114*  BUN 21 19  CREATININE 0.79 0.77  CALCIUM 8.5* 8.3*   PT/INR No results for input(s): "LABPROT", "INR" in the last 72 hours. ABG No results for input(s): "PHART", "HCO3" in the last 72 hours.  Invalid input(s): "PCO2", "PO2"  Studies/Results: DG Abd 2 Views  Result Date: 01/03/2023 CLINICAL DATA:  Postop nausea and vomiting EXAM: ABDOMEN - 2 VIEW COMPARISON:  12/29/2022 FINDINGS: Supine and upright frontal views of the abdomen and pelvis are obtained. Postsurgical changes are seen from midline laparotomy. Surgical drain projects over the lower pelvis. Distended gas-filled loops of small bowel are identified, measuring up to 4 cm in diameter. Minimal gas within nondistended loops of distal small bowel and colon. Left-sided colostomy. No free gas within the greater peritoneal sac. Lung bases are clear. IMPRESSION: 1.  Distended gas-filled loops of proximal small bowel, concerning for small bowel obstruction. Postoperative ileus could also be considered. Continued radiographic follow-up recommended. 2. Postsurgical changes from midline laparotomy, partial colectomy, and left-sided colostomy. Electronically Signed   By: Sharlet Salina M.D.   On: 01/03/2023 22:39    Anti-infectives: Anti-infectives (From admission, onward)    Start     Dose/Rate Route Frequency Ordered Stop   12/29/22 1100  piperacillin-tazobactam (ZOSYN) IVPB 3.375 g        3.375 g 12.5 mL/hr over 240 Minutes Intravenous Every 8 hours 12/29/22 1045 01/05/23 0759   12/29/22 0745  piperacillin-tazobactam (ZOSYN) IVPB 3.375 g        3.375 g 100 mL/hr over 30 Minutes Intravenous  Once 12/29/22 0744 12/29/22 0933       Assessment/Plan:  Ileus, pt wishes to have sips of clears Hold off ng Not ready for DC Other wise doing well  Sterling Big, MD, FACS  01/04/2023

## 2023-01-04 NOTE — Consult Note (Signed)
WOC Nurse ostomy follow up Stoma type/location: LMQ colostomy Stomal assessment/size: 1 3/8" moist, pink except an area of duskiness remains from 7 to 11 o'clock.  Digital exploration reveals pink moist mucosa.  Stoma is productive  Peristomal assessment:  Abdominal edema, some creasing at 3 and 9 o'clock.  Patient has been noticing odor, so we will add a barrier ring today to promote seal.  Midline staple line.  Treatment options for stomal/peristomal skin: barrier ring and 2 piece 2 1/4" pouch Output liquid brown stool Ostomy pouching: 2pc. 2 1/4" pouch with barrier ring  Education provided: WE perform pouch change.  Remove old pouch and cleanse skin with water. Apply barrier ring.  WE measure stoma (no change in size) Patient cuts barrier to fit and assembles pouch to barrier.  Using an mirror, patient applies pouch system and is able to roll pouch closed.  WE discuss twice weekly pouch changes, emptying when 1/3 full and rationale for adding barrier ring today.  Her daughter is coming in from out of town and will be helping her at home. She has not had a chance to review educational materials regarding ostomy care.  I remind her of the outpatient ostomy clinic at [facility] as needed for a resource after discharge.  SHe understands she will go home with 5 pouch sets and [employer] will be mailing her supplies and assisting with ongoing supply needs via medical supply company of her choosing.  Enrolled patient in [place] [employer] Discharge program: Yes 10/28 Will follow.  Mike Gip MSN, RN, FNP-BC CWON Wound, Ostomy, Continence Nurse Outpatient [facility] [phone] Pager [phone]

## 2023-01-05 LAB — CREATININE, SERUM
Creatinine, Ser: 0.79 mg/dL (ref 0.44–1.00)
GFR, Estimated: >60 mL/min (ref >60)

## 2023-01-05 NOTE — Progress Notes (Signed)
[facility] SURGICAL PROGRESS NOTE  Hospital Day(s): 7.   Post op day(s): 5 Days Post-Op.   Interval History:  Patient seen and examined No acute events or new complaints overnight.  Patient reports she is feeling much better Nausea has subsided  No fever, chills  No new labs Surgical drain with 15 ccs out in last 24 hours; serous CLD; tolerating well Ostomy with gas and liquid stool in bag  Zosyn  Ambulating without issue  Vital signs in last 24 hours: [min-max] current  Temp:  [98.1 F (36.7 C)-98.5 F (36.9 C)] 98.4 F (36.9 C) (10/31 0317) Pulse Rate:  [67-77] 70 (10/31 0317) Resp:  [16-20] 18 (10/31 0317) BP: (143-165)/(67-82) 150/76 (10/31 0317) SpO2:  [93 %-94 %] 94 % (10/31 0317)     Height: 5\' 4"  (162.6 cm) Weight: 85.7 kg BMI (Calculated): 32.43   Intake/Output last 2 shifts:  No intake/output data recorded.   Physical Exam:  Constitutional: alert, cooperative and no distress  Respiratory: breathing non-labored at rest  Cardiovascular: regular rate and sinus rhythm  Gastrointestinal: soft, non-tender, and non-distended, no rebound/guarding. Colostomy in left mid-abdomen; lots of liquid stool in bag. Surgical drain in right abdomen; output serous  Integumentary: Laparotomy is CDI with staples and penrose. Open portion superiorly packed, no bleeding, no erythema.   Labs:     Latest Ref Rng & Units 01/04/2023    5:30 AM 01/03/2023    5:03 AM 01/02/2023    5:26 AM  CBC  WBC 4.0 - 10.5 K/uL 14.5  12.8  14.1   Hemoglobin 12.0 - 15.0 g/dL 81.1  91.4  78.2   Hematocrit 36.0 - 46.0 % 40.4  40.7  37.3   Platelets 150 - 400 K/uL 427  397  335       Latest Ref Rng & Units 01/05/2023    4:54 AM 01/04/2023    5:30 AM 01/03/2023    5:03 AM  CMP  Glucose 70 - 99 mg/dL  956  213   BUN 8 - 23 mg/dL  19  21   Creatinine 0.86 - 1.00 mg/dL 5.78  4.69  6.29   Sodium 135 - 145 mmol/L  138  136   Potassium 3.5 - 5.1 mmol/L  3.6  3.3   Chloride 98 -  111 mmol/L  101  99   CO2 22 - 32 mmol/L  28  27   Calcium 8.9 - 10.3 mg/dL  8.3  8.5      Imaging studies: No new pertinent imaging studies   Assessment/Plan:  67 y.o. female with resolving post-operative ileus 5 Days Post-Op s/p Hartman's procedure for perforated diverticulitis.   - Will advance to FLD; okay to advance diet as tolerated today - Continue IV Abx (Zosyn) - will need PO for home  - Continue surgical drain; monitor and record output - will remove at DC  - Monitor abdominal examination - Pain control prn; antiemetics prn  - Monitor ostomy output; very liquid stools today - Suspect this is her ileus resolving, will hold on any antidiarrheal  - WOC RN evaluation on-board for ostomy teaching/care - Mobilize; doing well  - Discharge Planning: Doing much better now with return of bowel function. Will begin advancing diet. Monitor colostomy output. Hopefully home in next 24 hours as long as continues to progress. Will remove drain and penrose prior to DC.   All of the above findings and recommendations were discussed with the patient, and the medical team, and all  of patient's questions were answered to her expressed satisfaction.  -- Lynden Oxford, PA-C [facility] 01/05/2023, 7:27 AM M-F: 7am - 4pm

## 2023-01-05 NOTE — Progress Notes (Signed)
PT Cancellation Note  Patient Details Name: Michele Sanders MRN: [id number] DOB: 1956-03-03   Cancelled Treatment:     Pt worked with OT and ambulated 415ft in hallway with Mobility Specialist. Will check back tomorrow for continued PT needs.    Jannet Askew 01/05/2023, 2:07 PM

## 2023-01-05 NOTE — Plan of Care (Signed)
  Problem: Education: Goal: Knowledge of General Education information will improve Description: Including pain rating scale, medication(s)/side effects and non-pharmacologic comfort measures Outcome: Progressing   Problem: Health Behavior/Discharge Planning: Goal: Ability to manage health-related needs will improve Outcome: Progressing   Problem: Clinical Measurements: Goal: Ability to maintain clinical measurements within normal limits will improve Outcome: Progressing Goal: Will remain free from infection Outcome: Progressing Goal: Diagnostic test results will improve Outcome: Progressing Goal: Respiratory complications will improve Outcome: Progressing Goal: Cardiovascular complication will be avoided Outcome: Progressing   Problem: Activity: Goal: Risk for activity intolerance will decrease Outcome: Progressing   Problem: Nutrition: Goal: Adequate nutrition will be maintained Outcome: Progressing   Problem: Coping: Goal: Level of anxiety will decrease Outcome: Progressing   Problem: Elimination: Goal: Will not experience complications related to bowel motility Outcome: Progressing Goal: Will not experience complications related to urinary retention Outcome: Progressing   Problem: Pain Management: Goal: General experience of comfort will improve Outcome: Progressing   Problem: Safety: Goal: Ability to remain free from injury will improve Outcome: Progressing   Problem: Skin Integrity: Goal: Risk for impaired skin integrity will decrease Outcome: Progressing   Problem: Education: Goal: Knowledge of ostomy care will improve Outcome: Progressing Goal: Understanding of discharge needs will improve Outcome: Progressing   Problem: Bowel/Gastric/Urinary: Goal: Gastrointestinal status for postoperative course will improve Outcome: Progressing   Problem: Coping: Goal: Coping ability will improve Outcome: Progressing   Problem: Fluid Volume: Goal: Ability  to achieve a balanced intake and output will improve Outcome: Progressing   Problem: Health Behavior/Discharge Planning: Goal: Ability to manage health-related needs will improve Outcome: Progressing   Problem: Nutrition: Goal: Will attain and maintain optimal nutritional status will improve Outcome: Progressing   Problem: Clinical Measurements: Goal: Postoperative complications will be avoided or minimized Outcome: Progressing   Problem: Skin Integrity: Goal: Will show signs of wound healing Outcome: Progressing Goal: Risk for impaired skin integrity will decrease Outcome: Progressing

## 2023-01-05 NOTE — Consult Note (Signed)
WOC Nurse ostomy follow up Stoma type/location: LUQ, colostomy  Stomal assessment/size: 1 3/8", pink, assess through stoma. Pouch changed yesterday Peristomal assessment: NA Treatment options for stomal/peristomal skin: 2" skin barrier ring  Output liquid green stool  Ostomy pouching: 2pc. 21/4" with 2" ostomy barrier ring Education provided:  Met with patient and her daughter Allowed patient to go to the restroom and empty her pouch, cueing for cleaning the spout. Demonstrated how to use wick/toilet paper to clean spout and rationale for this.   6 extra pouches/skin barriers/ and ostomy barrier rings placed in patient's room for DC planning.  Answered daughter's questions related to "feces" getting on her mothers hands. Daughter was concerned about leakage, to my knowledge patient has not experienced issues with leakage during hospitalization.  Plans for [facility], patient has changed pouch with support from [facility] nurse just yesterday and feels good about skill set.  Discussed showers, frequency of pouch changes, use of filtered pouches, DME coverage, medication, diet, traveling with an ostomy. Provided daughter with extra set of educational materials and pointed out QR codes in the information that would allow her to watch videos on care of ostomy, pouch change, use of ostomy barrier rings.Discussed outpatient clinic location. Will ask Ian Malkin to make referral at DC.  Enrolled patient in [facility] [employer] Start Discharge program: Yes  WOC Nurse will follow along with you for continued support with ostomy teaching and care Hermon Zea [facility] MSN, RN, [place], CNS, [place] [phone]

## 2023-01-05 NOTE — TOC Progression Note (Addendum)
Transition of Care [facility]) - Progression Note    Patient Details  Name: Michele Sanders MRN: [id number] Date of Birth: 1956-01-28  Transition of Care [facility]) CM/SW Contact  Margarito Liner, LCSW Phone Number: 01/05/2023, 10:41 AM  Clinical Narrative: Received message to call daughter Michele Sanders. Patient gave permission. CSW provided updated on discharge plan to patient. Per surgical PA note, potential discharge tomorrow. [facility] liaison is aware and said start of care will likely be Saturday or Sunday. CSW left voicemail for daughter.    10:52 am: Received call back from daughter. Provided updated.  Expected Discharge Plan: Home w Home Health Services Barriers to Discharge: Continued Medical Work up  Expected Discharge Plan and Services     Post Acute Care Choice: Home Health Living arrangements for the past 2 months: Single Family Home                           HH Arranged: RN [facility] Agency: [facility] Date [facility] Agency Contacted: 01/02/23   Representative spoke with at [facility] Agency: Kandee Keen   Social Determinants of Health (SDOH) Interventions SDOH Screenings   Food Insecurity: No Food Insecurity (12/29/2022)  Housing: Low Risk  (12/29/2022)  Transportation Needs: No Transportation Needs (12/29/2022)  Utilities: Not At Risk (12/29/2022)  Financial Resource Strain: Low Risk  (12/12/2022)   Received from [facility]  Tobacco Use: High Risk (12/29/2022)    Readmission Risk Interventions     No data to display

## 2023-01-05 NOTE — Progress Notes (Signed)
Occupational Therapy Treatment Patient Details Name: Michele Sanders MRN: [id number] DOB: 1955/03/26 Today's Date: 01/05/2023   History of present illness Patient is a 67 year old female s/p Hartman's procedure for perforated diverticulitis.   OT comments  Pt is found on EOB with family present in room. Pleasant and agreeable to OT session. She did no c/o pain during session. Pt performed STS from EOB with MOD I and ambulated within the room with no issues/LOB. Educated pt on toilet riser versus BSC veresus supportive rails over her commode. Located appropriate items online and pt feels comfortable with toilet transfers upon return home. Educated on use of reacher to don underwear and pt demo ability back to therapist with SUP in seated/standing; also edu on use of shoe horn and sock aide for other LB dressing tasks with pt and family verbalizing understanding and where to purchase desired items. Pt left seated at EOB with all needs in place and will cont to require skilled acute OT services to maximize her safety and IND to return to PLOF.       If plan is discharge home, recommend the following:  A little help with bathing/dressing/bathroom;Assistance with cooking/housework;Help with stairs or ramp for entrance   Equipment Recommendations  None recommended by OT (pt to order rails to go by toilet)    Recommendations for Other Services      Precautions / Restrictions Precautions Precautions: Fall Precaution Comments: logroll for comfort Restrictions Weight Bearing Restrictions: No       Mobility Bed Mobility               General bed mobility comments: NT pt on EOB    Transfers Overall transfer level: Modified independent   Transfers: Sit to/from Stand Sit to Stand: Modified independent (Device/Increase time)           General transfer comment: MOD I for STS from EOB     Balance Overall balance assessment: No apparent balance deficits (not formally assessed)                                          ADL either performed or assessed with clinical judgement   ADL Overall ADL's : Needs assistance/impaired                     Lower Body Dressing: Sitting/lateral leans;Sit to/from stand;Supervision/safety;With adaptive equipment Lower Body Dressing Details (indicate cue type and reason): pt educated on use of reacher to don underwear and demo ability back to therapist with SUP seated/standing; also edu on use of shoe horn and sock aide for other LB dressing tasks with pt and family verbalizing understanding.                    Extremity/Trunk Assessment Upper Extremity Assessment Upper Extremity Assessment: Generalized weakness   Lower Extremity Assessment Lower Extremity Assessment: Generalized weakness        Vision       Perception     Praxis      Cognition Arousal: Alert Behavior During Therapy: WFL for tasks assessed/performed Overall Cognitive Status: Within Functional Limits for tasks assessed                                          Exercises Other  Exercises Other Exercises: Educated pt on toilet riser versus BSC veresus supportive rails over her commode. Located appropriate items online and pt feels comfortable with toilet transfers upon return home.    Shoulder Instructions       General Comments      Pertinent Vitals/ Pain       Pain Assessment Pain Assessment: No/denies pain  Home Living                                          Prior Functioning/Environment              Frequency  Min 1X/week        Progress Toward Goals  OT Goals(current goals can now be found in the care plan section)  Progress towards OT goals: Progressing toward goals  Acute Rehab OT Goals Patient Stated Goal: return home OT Goal Formulation: With patient/family Time For Goal Achievement: 01/17/23 Potential to Achieve Goals: Good  Plan       Co-evaluation                 AM-PAC OT "6 Clicks" Daily Activity     Outcome Measure   Help from another person eating meals?: None Help from another person taking care of personal grooming?: None Help from another person toileting, which includes using toliet, bedpan, or urinal?: A Little Help from another person bathing (including washing, rinsing, drying)?: A Little Help from another person to put on and taking off regular upper body clothing?: None Help from another person to put on and taking off regular lower body clothing?: A Little 6 Click Score: 21    End of Session    OT Visit Diagnosis: Other abnormalities of gait and mobility (R26.89)   Activity Tolerance Patient tolerated treatment well   Patient Left in bed;with family/visitor present   Nurse Communication          Time: 6962-9528 OT Time Calculation (min): 27 min  Charges: OT General Charges $OT Visit: 1 Visit OT Treatments $Self Care/Home Management : 23-37 mins  Bralyn Folkert, OTR/L  01/05/23, 1:37 PM  Ozell Juhasz E Mylz Yuan 01/05/2023, 1:34 PM

## 2023-01-05 NOTE — Progress Notes (Signed)
Mobility Specialist - Progress Note     01/05/23 1305  Mobility  Activity Ambulated independently in hallway  Level of Assistance Independent  Assistive Device None  Distance Ambulated (ft) 480 ft  Range of Motion/Exercises Active  Activity Response Tolerated well  Mobility Referral Yes  $Mobility charge 1 Mobility  Mobility Specialist Start Time (ACUTE ONLY) 1250  Mobility Specialist Stop Time (ACUTE ONLY) 1305  Mobility Specialist Time Calculation (min) (ACUTE ONLY) 15 min   Pt resting in bed on RA upon entry. Pt nauseous but still agreeable to participate in ambulation. Pt STS and ambulates to hallway around NS indep with no RW. Pt returned to bed and left with needs in reach.   Johnathan Hausen Mobility Specialist 01/05/23, 2:01 PM

## 2023-01-06 LAB — AEROBIC/ANAEROBIC CULTURE W GRAM STAIN (SURGICAL/DEEP WOUND): Culture: NO GROWTH

## 2023-01-06 MED ORDER — ONDANSETRON 4 MG PO TBDP: 4.0000 mg | ORAL_TABLET | Freq: Four times a day (QID) | ORAL | 0 refills | Status: DC | PRN

## 2023-01-06 MED ORDER — TRAMADOL HCL 50 MG PO TABS: 50.0000 mg | ORAL_TABLET | Freq: Four times a day (QID) | ORAL | 0 refills | Status: DC | PRN

## 2023-01-06 NOTE — Discharge Instructions (Addendum)
In addition to included general post-operative instructions,  Diet: Resume home diet.   Activity: No heavy lifting >20 pounds (children, pets, laundry, garbage) or strenuous activity for 6 weeks, but light activity and walking are encouraged. Do not drive or drink alcohol if taking narcotic pain medications or having pain that might distract from driving. Okay to work with therapy at home.   Wound care: You may shower/get incision wet with soapy water and pat dry (do not rub incisions), but no baths or submerging incision underwater until follow-up. We will remove staples in follow up on 11/11. Keep wound covered at home; okay to remove dressing to shower.   Medications: Resume all home medications. For mild to moderate pain: acetaminophen (Tylenol) or ibuprofen/naproxen (if no kidney disease). Combining Tylenol with alcohol can substantially increase your risk of causing liver disease. Narcotic pain medications, if prescribed, can be used for severe pain, though may cause nausea, constipation, and drowsiness. Do not combine Tylenol and Percocet (or similar) within a 6 hour period as Percocet (and similar) contain(s) Tylenol. If you do not need the narcotic pain medication, you do not need to fill the prescription.  Call office [phone] / [phone]) at any time if any questions, worsening pain, fevers/chills, bleeding, drainage from incision site, or other concerns.

## 2023-01-06 NOTE — Plan of Care (Signed)
  Problem: Education: Goal: Knowledge of General Education information will improve Description: Including pain rating scale, medication(s)/side effects and non-pharmacologic comfort measures Outcome: Progressing   Problem: Health Behavior/Discharge Planning: Goal: Ability to manage health-related needs will improve Outcome: Progressing   Problem: Clinical Measurements: Goal: Ability to maintain clinical measurements within normal limits will improve Outcome: Progressing Goal: Will remain free from infection Outcome: Progressing Goal: Diagnostic test results will improve Outcome: Progressing Goal: Respiratory complications will improve Outcome: Progressing Goal: Cardiovascular complication will be avoided Outcome: Progressing   Problem: Activity: Goal: Risk for activity intolerance will decrease Outcome: Progressing   Problem: Nutrition: Goal: Adequate nutrition will be maintained Outcome: Progressing   Problem: Coping: Goal: Level of anxiety will decrease Outcome: Progressing   Problem: Elimination: Goal: Will not experience complications related to bowel motility Outcome: Progressing Goal: Will not experience complications related to urinary retention Outcome: Progressing   Problem: Pain Management: Goal: General experience of comfort will improve Outcome: Progressing   Problem: Safety: Goal: Ability to remain free from injury will improve Outcome: Progressing   Problem: Skin Integrity: Goal: Risk for impaired skin integrity will decrease Outcome: Progressing   Problem: Education: Goal: Knowledge of ostomy care will improve Outcome: Progressing Goal: Understanding of discharge needs will improve Outcome: Progressing   Problem: Bowel/Gastric/Urinary: Goal: Gastrointestinal status for postoperative course will improve Outcome: Progressing   Problem: Coping: Goal: Coping ability will improve Outcome: Progressing   Problem: Fluid Volume: Goal: Ability  to achieve a balanced intake and output will improve Outcome: Progressing   Problem: Health Behavior/Discharge Planning: Goal: Ability to manage health-related needs will improve Outcome: Progressing   Problem: Nutrition: Goal: Will attain and maintain optimal nutritional status will improve Outcome: Progressing   Problem: Clinical Measurements: Goal: Postoperative complications will be avoided or minimized Outcome: Progressing   Problem: Skin Integrity: Goal: Will show signs of wound healing Outcome: Progressing Goal: Risk for impaired skin integrity will decrease Outcome: Progressing

## 2023-01-06 NOTE — Care Management Important Message (Signed)
Important Message  Patient Details  Name: Michele Sanders MRN: [id number] Date of Birth: Oct 03, 1955   Important Message Given:  Yes - Medicare IM     Michele Sanders 01/06/2023, 10:14 AM

## 2023-01-06 NOTE — TOC Transition Note (Signed)
Transition of Care [facility]) - CM/SW Discharge Note   Patient Details  Name: Michele Sanders MRN: [id number] Date of Birth: 09-16-1955  Transition of Care [facility]) CM/SW Contact:  Truddie Hidden, RN Phone Number: 01/06/2023, 12:28 PM   Clinical Narrative:    Kandee Keen from [place] notified of discharge  RNCM spoke with patient to advise a  [facility] rep will contact her to schedule her appointment.    TOC signing off.      Barriers to Discharge: Continued Medical Work up   Patient Goals and CMS Choice   Choice offered to / list presented to : Patient  Discharge Placement                         Discharge Plan and Services Additional resources added to the After Visit Summary for       Post Acute Care Choice: Home Health                    HH Arranged: RN [facility] Agency: [facility] Date [facility] Agency Contacted: 01/02/23   Representative spoke with at [facility] Agency: Kandee Keen  Social Determinants of Health (SDOH) Interventions SDOH Screenings   Food Insecurity: No Food Insecurity (12/29/2022)  Housing: Low Risk  (12/29/2022)  Transportation Needs: No Transportation Needs (12/29/2022)  Utilities: Not At Risk (12/29/2022)  Financial Resource Strain: Low Risk  (12/12/2022)   Received from [facility]  Tobacco Use: High Risk (12/29/2022)     Readmission Risk Interventions     No data to display

## 2023-01-06 NOTE — Discharge Summary (Signed)
[facility] SURGICAL DISCHARGE SUMMARY  Patient ID: Michele Sanders MRN: [id number] DOB/AGE: [birth year] 67 y.o.  Admit date: 12/29/2022 Discharge date: 01/06/2023  Discharge Diagnoses Patient Active Problem List   Diagnosis Date Noted   Perforated diverticulum 12/31/2022   Diverticulitis large intestine 12/29/2022    Consultants None  Procedures 12/31/2022 Open Hartmann's procedure Takedown of splenic flexure  HPI / Hospital Course: Michele Sanders is a 67 y.o. female admitted to [facility] on 10/24 secondary to perforated diverticulitis. At the time of admission, she was doing well and without evidence of peritonitis. Unfortunately, on HD2 she worsened and decision was made to proceed with surgery. Informed consent was obtained and documented, and patient underwent uneventful Hartman's Procedure (Dr Everlene Farrier, 12/31/2022).  Post-operatively, patient did well initially but did develop post-operative ileus. NGT placement was attempted but she declined. Ultimately recovered without issue. Advancement of patient's diet and ambulation were well-tolerated. The remainder of patient's hospital course was essentially unremarkable, and discharge planning was initiated accordingly with patient safely able to be discharged home with appropriate discharge instructions, pain control, and outpatient follow-up after all of her questions were answered to her expressed satisfaction.   Discharge Condition: Good    Physical Examination:  Constitutional: alert, cooperative and no distress  Respiratory: breathing non-labored at rest  Cardiovascular: regular rate and sinus rhythm  Gastrointestinal: soft, non-tender, and non-distended, no rebound/guarding. Colostomy in left mid-abdomen; lots of liquid stool in bag. Surgical drain in right abdomen; output serous (REMOVED)  Integumentary: Laparotomy is CDI with staples and penrose (REMOVED). Open portion superiorly packed, no bleeding, no erythema.     Allergies as of 01/06/2023   No Known Allergies      Medication List     STOP taking these medications    ciprofloxacin 500 MG tablet Commonly known as: CIPRO   metroNIDAZOLE 500 MG tablet Commonly known as: FLAGYL       TAKE these medications    acetaminophen 500 MG tablet Commonly known as: TYLENOL Take 1,000 mg by mouth every 6 (six) hours as needed.   aspirin 81 MG chewable tablet Chew 81 mg by mouth daily.   cholecalciferol 25 MCG (1000 UNIT) tablet Commonly known as: VITAMIN D3 Take 1,000 Units by mouth daily.   cyanocobalamin 1000 MCG tablet Commonly known as: VITAMIN B12 Take 1,000 mcg by mouth daily.   docusate sodium 100 MG capsule Commonly known as: Colace Take 1 capsule (100 mg total) by mouth daily as needed.   ibuprofen 200 MG tablet Commonly known as: ADVIL Take 200 mg by mouth every 6 (six) hours as needed. 2 tablets   lisinopril 20 MG tablet Commonly known as: ZESTRIL Take 20 mg by mouth daily.   ondansetron 4 MG disintegrating tablet Commonly known as: ZOFRAN-ODT Take 1 tablet (4 mg total) by mouth every 6 (six) hours as needed for nausea.   pantoprazole 40 MG tablet Commonly known as: PROTONIX Take 40 mg by mouth daily.   simvastatin 40 MG tablet Commonly known as: ZOCOR Take 40 mg by mouth daily at 6 PM.   traMADol 50 MG tablet Commonly known as: ULTRAM Take 1 tablet (50 mg total) by mouth every 6 (six) hours as needed for moderate pain (pain score 4-6) or severe pain (pain score 7-10) (mild pain).          Follow-up Information     [facility] Follow up.   Specialty: Home Health Services Why: They will follow up with you for your home health  needs. Contact information: [address] [place] [address] [phone]         Leafy Ro, MD. Go on 01/16/2023.   Specialty: General Surgery Why: Go to appointment on 11/11 for staple removal Contact information: [address] [place] [address] [phone]                  Time spent on discharge management including discussion of hospital course, clinical condition, outpatient instructions, prescriptions, and follow up with the patient and members of the medical team: >30 minutes  -- Lynden Oxford , PA-C [facility]  01/06/2023, 9:22 AM [phone] M-F: 7am - 4pm

## 2023-01-08 ENCOUNTER — Inpatient Hospital Stay
Admission: EM | Admit: 2023-01-08 | Discharge: 2023-01-12 | DRG: 394 | Disposition: A | Discharge status: Home Health | Payer: Medicare HMO | Attending: Surgery | Admitting: Surgery

## 2023-01-08 DIAGNOSIS — K56609 Unspecified intestinal obstruction, unspecified as to partial versus complete obstruction: Principal | ICD-10-CM | POA: Diagnosis present

## 2023-01-08 DIAGNOSIS — R7303 Prediabetes: Secondary | ICD-10-CM | POA: Diagnosis present

## 2023-01-08 DIAGNOSIS — Z79899 Other long term (current) drug therapy: Secondary | ICD-10-CM

## 2023-01-08 DIAGNOSIS — D72829 Elevated white blood cell count, unspecified: Secondary | ICD-10-CM | POA: Diagnosis present

## 2023-01-08 DIAGNOSIS — K9132 Postprocedural complete intestinal obstruction: Secondary | ICD-10-CM

## 2023-01-08 DIAGNOSIS — Z87442 Personal history of urinary calculi: Secondary | ICD-10-CM

## 2023-01-08 DIAGNOSIS — F17210 Nicotine dependence, cigarettes, uncomplicated: Secondary | ICD-10-CM | POA: Diagnosis present

## 2023-01-08 DIAGNOSIS — K567 Ileus, unspecified: Principal | ICD-10-CM | POA: Diagnosis present

## 2023-01-08 DIAGNOSIS — Z7982 Long term (current) use of aspirin: Secondary | ICD-10-CM

## 2023-01-08 DIAGNOSIS — Y838 Other surgical procedures as the cause of abnormal reaction of the patient, or of later complication, without mention of misadventure at the time of the procedure: Secondary | ICD-10-CM | POA: Diagnosis present

## 2023-01-08 DIAGNOSIS — N179 Acute kidney failure, unspecified: Secondary | ICD-10-CM | POA: Diagnosis present

## 2023-01-08 DIAGNOSIS — I10 Essential (primary) hypertension: Secondary | ICD-10-CM | POA: Diagnosis present

## 2023-01-08 DIAGNOSIS — T8131XA Disruption of external operation (surgical) wound, not elsewhere classified, initial encounter: Secondary | ICD-10-CM | POA: Diagnosis present

## 2023-01-08 DIAGNOSIS — Z933 Colostomy status: Secondary | ICD-10-CM

## 2023-01-08 DIAGNOSIS — E876 Hypokalemia: Secondary | ICD-10-CM | POA: Diagnosis present

## 2023-01-08 LAB — LIPASE, BLOOD: Lipase: 42 U/L (ref 11–51)

## 2023-01-08 LAB — COMPREHENSIVE METABOLIC PANEL
ALT: 34 U/L (ref 0–44)
AST: 25 U/L (ref 15–41)
Albumin: 4.1 g/dL (ref 3.5–5.0)
Alkaline Phosphatase: 123 U/L (ref 38–126)
Anion gap: 12 (ref 5–15)
BUN: 15 mg/dL (ref 8–23)
CO2: 20 mmol/L — ABNORMAL LOW (ref 22–32)
Calcium: 9.2 mg/dL (ref 8.9–10.3)
Chloride: 103 mmol/L (ref 98–111)
Creatinine, Ser: 1.09 mg/dL — ABNORMAL HIGH (ref 0.44–1.00)
GFR, Estimated: 55 mL/min — ABNORMAL LOW (ref >60)
Glucose, Bld: 126 mg/dL — ABNORMAL HIGH (ref 70–99)
Potassium: 4 mmol/L (ref 3.5–5.1)
Sodium: 135 mmol/L (ref 135–145)
Total Bilirubin: 0.8 mg/dL (ref 0.3–1.2)
Total Protein: 8.2 g/dL — ABNORMAL HIGH (ref 6.5–8.1)

## 2023-01-08 LAB — CBC
HCT: 41.9 % (ref 36.0–46.0)
Hemoglobin: 13.6 g/dL (ref 12.0–15.0)
MCH: 29.3 pg (ref 26.0–34.0)
MCHC: 32.5 g/dL (ref 30.0–36.0)
MCV: 90.3 fL (ref 80.0–100.0)
Platelets: 598 10*3/uL — ABNORMAL HIGH (ref 150–400)
RBC: 4.64 MIL/uL (ref 3.87–5.11)
RDW: 13.2 % (ref 11.5–15.5)
WBC: 18.5 10*3/uL — ABNORMAL HIGH (ref 4.0–10.5)
nRBC: 0 % (ref 0.0–0.2)

## 2023-01-08 LAB — LACTIC ACID, PLASMA: Lactic Acid, Venous: 0.9 mmol/L (ref 0.5–1.9)

## 2023-01-08 MED ORDER — DROPERIDOL 2.5 MG/ML IJ SOLN
1.2500 mg | Freq: Once | INTRAMUSCULAR | Status: AC
  Administered 2023-01-09: 1.25 mg via INTRAVENOUS
  Filled 2023-01-08: qty 2

## 2023-01-08 MED ORDER — LACTATED RINGERS IV BOLUS
1000.0000 mL | Freq: Once | INTRAVENOUS | Status: AC
  Administered 2023-01-09: 1000 mL via INTRAVENOUS

## 2023-01-08 NOTE — ED Notes (Signed)
Pt daughter up to desk reporting that pt "can taste feces". Stafford MD notified, no new orders placed.

## 2023-01-08 NOTE — ED Provider Notes (Incomplete)
[facility] Provider Note    Event Date/Time   First MD Initiated Contact with Patient 01/08/23 2316     (approximate)   History   Abdominal Pain   HPI Michele Sanders is a 67 y.o. female who has had a complicated recent course of surgery due to perforated diverticulitis, Michele Sanders procedure (colostomy), and discharged from the hospital about 2 to 3 days ago.  She presents tonight for worsening nausea, vomiting, inability to tolerate oral intake, and some pain.  When she burps or vomits it has a fecal odor.  She has had liquid output from her colostomy.  She and her daughter who is with her at bedside also expressed concern that part of the stoma seems to have become black and is sloughing off.  Additionally, the superior part of the surgical wound has dehisced and there is some purulent material draining from it.  No fever, no chest pain or shortness of breath.  Patient feels generally weak and ill but without any other symptoms except as described above.     Physical Exam   Triage Vital Signs: ED Triage Vitals  Encounter Vitals Group     BP 01/08/23 1932 106/67     Systolic BP Percentile --      Diastolic BP Percentile --      Pulse Rate 01/08/23 1932 (!) 103     Resp 01/08/23 1932 18     Temp 01/08/23 1932 98.2 F (36.8 C)     Temp Source 01/08/23 1932 Oral     SpO2 01/08/23 1932 96 %     Weight 01/08/23 1932 85.7 kg (189 lb)     Height 01/08/23 1932 1.626 m (5\' 4" )     Head Circumference --      Peak Flow --      Pain Score 01/08/23 1932 10     Pain Loc --      Pain Education --      Exclude from Growth Chart --     Most recent vital signs: Vitals:   01/09/23 0030 01/09/23 0222  BP: (!) 110/58   Pulse: 85   Resp:    Temp:  98.3 F (36.8 C)  SpO2: 94%     General: Awake, Ill-appearing but nontoxic. CV:  Good peripheral perfusion.  Regular rate and rhythm. Resp:  Normal effort. Speaking easily and comfortably, no accessory muscle usage  nor intercostal retractions.   Abd:  Mild distention.  Generalized tenderness palpation throughout the abdomen.  Light tan liquid stool present in colostomy bag.  The stoma appears pink in the inferior part, but the superior part of the stoma does have a dark, nearly black appearance.  Additionally there is a several centimeter wound dehiscence of the vertical surgical incision and there is purulent material present in the wound which I personally sampled for the wound culture.   ED Results / Procedures / Treatments   Labs (all labs ordered are listed, but only abnormal results are displayed) Labs Reviewed  COMPREHENSIVE METABOLIC PANEL - Abnormal; Notable for the following components:      Result Value   CO2 20 (*)    Glucose, Bld 126 (*)    Creatinine, Ser 1.09 (*)    Total Protein 8.2 (*)    GFR, Estimated 55 (*)    All other components within normal limits  CBC - Abnormal; Notable for the following components:   WBC 18.5 (*)    Platelets 598 (*)  All other components within normal limits  AEROBIC/ANAEROBIC CULTURE W GRAM STAIN (SURGICAL/DEEP WOUND)  LIPASE, BLOOD  LACTIC ACID, PLASMA  LACTIC ACID, PLASMA  URINALYSIS, ROUTINE W REFLEX MICROSCOPIC     RADIOLOGY I viewed and interpreted the patient's CT scan and she has air-fluid levels consistent with SBO.  Radiologist confirmed SBO with multiple acute abnormalities but did not comment on concerns of necrosis of the stoma.   PROCEDURES:  Critical Care performed: No  Procedures    IMPRESSION / MDM / ASSESSMENT AND PLAN / ED COURSE  I reviewed the triage vital signs and the nursing notes.                              Differential diagnosis includes, but is not limited to, SBO, ileus, postoperative infection, tissue ischemia.  Patient's presentation is most consistent with acute presentation with potential threat to life or bodily function.  Labs/studies ordered: CMP, lactic acid, wound culture, urinalysis, lipase,  CBC, CT abdomen/pelvis  Interventions/Medications given:  Medications  piperacillin-tazobactam (ZOSYN) IVPB 3.375 g (has no administration in time range)  lactated ringers bolus 1,000 mL (1,000 mLs Intravenous New Bag/Given 01/09/23 0023)  droperidol (INAPSINE) 2.5 MG/ML injection 1.25 mg (1.25 mg Intravenous Given 01/09/23 0024)  iohexol (OMNIPAQUE) 350 MG/ML injection 100 mL (100 mLs Intravenous Contrast Given 01/09/23 0036)    (Note:  hospital course my include additional interventions and/or labs/studies not listed above.)   Patient is ill-appearing though nontoxic.  Stable vital signs.  Clinically she almost certainly had a small bowel obstruction which was confirmed by CT scan.  Leukocytosis is increasing from prior and is now 18.5.  Given the concerns for wound infection I ordered Zosyn 3.375 g IV and vancomycin per pharmacy consult.  Also ordered a liter of LR but she does not meet sepsis criteria based on a normal lactic acid and normal vital signs.  I consulted Dr. Claudine Mouton who is getting ready to go in to the operating room but will admit the patient once he is done in the OR.  I ordered an NG tube placement based on his recommendation.  I updated the patient and left a HIPAA compliant voicemail for her daughter who had to leave in the interim.  The patient is on the cardiac monitor to evaluate for evidence of arrhythmia and/or significant heart rate changes.   Clinical Course as of 01/09/23 0459  Mon Jan 09, 2023  3244 DG Abdomen 1 View I viewed and interpreted the patient's abdominal x-ray and confirm placement of NG tube.  Radiology report agrees. [CF]  [phone] Patient having more nausea, I am ordering another dose of droperidol 1.25 mg IV. [CF]    Clinical Course User Index [CF] Michele Rose, MD     FINAL CLINICAL IMPRESSION(S) / ED DIAGNOSES   Final diagnoses:  SBO (small bowel obstruction) (HCC)  Postprocedural complete intestinal obstruction     Rx / DC Orders    ED Discharge Orders     None        Note:  This document was prepared using Dragon voice recognition software and may include unintentional dictation errors.   Michele Rose, MD 01/09/23 7253    Michele Rose, MD 01/09/23 [phone]

## 2023-01-08 NOTE — ED Triage Notes (Addendum)
Pt arrived POV for intermittent continued abd pain, has hx of diverticulitis, was seen 10/24 for same. Admitted 10/24 for perforated diverticulum. Pt has a new colostomy bag, reports has had nausea and diarrhea. A&O x4, NAD noted, VSS. Afebrile. Daughter reports pt has taken zofran x2 today w/o relief. Daughter also reports pt "burp and it smelled like a fart".

## 2023-01-09 ENCOUNTER — Encounter: Payer: Self-pay | Admitting: Surgery

## 2023-01-09 ENCOUNTER — Other Ambulatory Visit: Payer: Self-pay

## 2023-01-09 ENCOUNTER — Emergency Department: Payer: Medicare HMO

## 2023-01-09 DIAGNOSIS — Z7982 Long term (current) use of aspirin: Secondary | ICD-10-CM | POA: Diagnosis not present

## 2023-01-09 DIAGNOSIS — Z79899 Other long term (current) drug therapy: Secondary | ICD-10-CM | POA: Diagnosis not present

## 2023-01-09 DIAGNOSIS — K567 Ileus, unspecified: Secondary | ICD-10-CM | POA: Diagnosis present

## 2023-01-09 DIAGNOSIS — K56609 Unspecified intestinal obstruction, unspecified as to partial versus complete obstruction: Secondary | ICD-10-CM | POA: Diagnosis present

## 2023-01-09 DIAGNOSIS — R7303 Prediabetes: Secondary | ICD-10-CM | POA: Diagnosis present

## 2023-01-09 DIAGNOSIS — E876 Hypokalemia: Secondary | ICD-10-CM | POA: Diagnosis present

## 2023-01-09 DIAGNOSIS — D72829 Elevated white blood cell count, unspecified: Secondary | ICD-10-CM | POA: Diagnosis present

## 2023-01-09 DIAGNOSIS — Y838 Other surgical procedures as the cause of abnormal reaction of the patient, or of later complication, without mention of misadventure at the time of the procedure: Secondary | ICD-10-CM | POA: Diagnosis present

## 2023-01-09 DIAGNOSIS — F17210 Nicotine dependence, cigarettes, uncomplicated: Secondary | ICD-10-CM | POA: Diagnosis present

## 2023-01-09 DIAGNOSIS — T8131XA Disruption of external operation (surgical) wound, not elsewhere classified, initial encounter: Secondary | ICD-10-CM | POA: Diagnosis present

## 2023-01-09 DIAGNOSIS — Z87442 Personal history of urinary calculi: Secondary | ICD-10-CM | POA: Diagnosis not present

## 2023-01-09 DIAGNOSIS — I10 Essential (primary) hypertension: Secondary | ICD-10-CM | POA: Diagnosis present

## 2023-01-09 DIAGNOSIS — Z933 Colostomy status: Secondary | ICD-10-CM | POA: Diagnosis not present

## 2023-01-09 DIAGNOSIS — N179 Acute kidney failure, unspecified: Secondary | ICD-10-CM | POA: Diagnosis present

## 2023-01-09 LAB — URINALYSIS, ROUTINE W REFLEX MICROSCOPIC
Bacteria, UA: NONE SEEN
Bilirubin Urine: NEGATIVE
Glucose, UA: NEGATIVE mg/dL
Ketones, ur: 5 mg/dL — AB
Nitrite: NEGATIVE
Protein, ur: NEGATIVE mg/dL
Specific Gravity, Urine: >1.046 — ABNORMAL HIGH (ref 1.005–1.030)
pH: 5 (ref 5.0–8.0)

## 2023-01-09 LAB — BASIC METABOLIC PANEL
Anion gap: 12 (ref 5–15)
BUN: 17 mg/dL (ref 8–23)
CO2: 22 mmol/L (ref 22–32)
Calcium: 8.4 mg/dL — ABNORMAL LOW (ref 8.9–10.3)
Chloride: 101 mmol/L (ref 98–111)
Creatinine, Ser: 1.28 mg/dL — ABNORMAL HIGH (ref 0.44–1.00)
GFR, Estimated: 45 mL/min — ABNORMAL LOW (ref >60)
Glucose, Bld: 105 mg/dL — ABNORMAL HIGH (ref 70–99)
Potassium: 4.7 mmol/L (ref 3.5–5.1)
Sodium: 135 mmol/L (ref 135–145)

## 2023-01-09 LAB — CBC
HCT: 36.5 % (ref 36.0–46.0)
Hemoglobin: 11.5 g/dL — ABNORMAL LOW (ref 12.0–15.0)
MCH: 29.2 pg (ref 26.0–34.0)
MCHC: 31.5 g/dL (ref 30.0–36.0)
MCV: 92.6 fL (ref 80.0–100.0)
Platelets: 462 10*3/uL — ABNORMAL HIGH (ref 150–400)
RBC: 3.94 MIL/uL (ref 3.87–5.11)
RDW: 13.2 % (ref 11.5–15.5)
WBC: 18.4 10*3/uL — ABNORMAL HIGH (ref 4.0–10.5)
nRBC: 0 % (ref 0.0–0.2)

## 2023-01-09 LAB — LACTIC ACID, PLASMA: Lactic Acid, Venous: 1.1 mmol/L (ref 0.5–1.9)

## 2023-01-09 MED ORDER — ENOXAPARIN SODIUM 40 MG/0.4ML IJ SOSY
40.0000 mg | PREFILLED_SYRINGE | INTRAMUSCULAR | Status: DC
  Administered 2023-01-09 – 2023-01-12 (×4): 40 mg via SUBCUTANEOUS
  Filled 2023-01-09 (×4): qty 0.4

## 2023-01-09 MED ORDER — PANTOPRAZOLE SODIUM 40 MG IV SOLR
40.0000 mg | Freq: Every day | INTRAVENOUS | Status: DC
  Administered 2023-01-09 – 2023-01-11 (×3): 40 mg via INTRAVENOUS
  Filled 2023-01-09 (×3): qty 10

## 2023-01-09 MED ORDER — SODIUM CHLORIDE 0.9 % IV SOLN: Freq: Once | INTRAVENOUS | Status: AC

## 2023-01-09 MED ORDER — DIPHENHYDRAMINE HCL 50 MG/ML IJ SOLN: 12.5000 mg | Freq: Four times a day (QID) | INTRAMUSCULAR | Status: DC | PRN

## 2023-01-09 MED ORDER — VANCOMYCIN HCL 1750 MG/350ML IV SOLN
1750.0000 mg | Freq: Once | INTRAVENOUS | Status: AC
  Administered 2023-01-09: 1750 mg via INTRAVENOUS
  Filled 2023-01-09: qty 350

## 2023-01-09 MED ORDER — PIPERACILLIN-TAZOBACTAM 3.375 G IVPB 30 MIN
3.3750 g | Freq: Once | INTRAVENOUS | Status: AC
  Administered 2023-01-09: 3.375 g via INTRAVENOUS
  Filled 2023-01-09 (×2): qty 50

## 2023-01-09 MED ORDER — IOHEXOL 350 MG/ML SOLN
100.0000 mL | Freq: Once | INTRAVENOUS | Status: AC | PRN
  Administered 2023-01-09: 100 mL via INTRAVENOUS

## 2023-01-09 MED ORDER — PIPERACILLIN-TAZOBACTAM 3.375 G IVPB
3.3750 g | Freq: Three times a day (TID) | INTRAVENOUS | Status: DC
  Administered 2023-01-09 – 2023-01-12 (×10): 3.375 g via INTRAVENOUS
  Filled 2023-01-09 (×10): qty 50

## 2023-01-09 MED ORDER — DROPERIDOL 2.5 MG/ML IJ SOLN
1.2500 mg | Freq: Once | INTRAMUSCULAR | Status: AC
  Administered 2023-01-09: 1.25 mg via INTRAVENOUS
  Filled 2023-01-09: qty 2

## 2023-01-09 MED ORDER — SODIUM CHLORIDE 0.9 % IV SOLN: INTRAVENOUS | Status: AC

## 2023-01-09 MED ORDER — ONDANSETRON HCL 4 MG/2ML IJ SOLN: 4.0000 mg | Freq: Four times a day (QID) | INTRAMUSCULAR | Status: DC | PRN

## 2023-01-09 MED ORDER — ONDANSETRON 4 MG PO TBDP: 4.0000 mg | ORAL_TABLET | Freq: Four times a day (QID) | ORAL | Status: DC | PRN

## 2023-01-09 MED ORDER — DIPHENHYDRAMINE HCL 12.5 MG/5ML PO ELIX: 12.5000 mg | ORAL_SOLUTION | Freq: Four times a day (QID) | ORAL | Status: DC | PRN

## 2023-01-09 NOTE — Progress Notes (Signed)
Brief Note Seen same day of admission Still with abdominal distension No fever, chills Ostomy appliance recently changes although she reports continued function Leukocytosis to 18.4K Hgb to 11.5 AKI; sCr - 1.28; UO - unmeasured NGT in place; output unmeasured   Exam Vitals:   01/09/23 1000 01/09/23 1023  BP: 132/60   Pulse: 73   Resp: 14   Temp:  97.8 F (36.6 C)  SpO2: 93%     She is resting in bed; NAD NGT in place; output bilious Abdomen is soft, not overtly tender, she is distended and tympanic Colostomy in left abdomen, pink, patent, bag recently changes Laparotomy CDI with staples, known open near umbilicus with packing, skin mildly erythematous   A/P:  Question of SBO although unlikely in short period of time post-operatively. Favor potentially ileus instead. Treatment remains the same regardless. No evidence of bowel injury nor abscess.   - NPO + IVF support - Continue NGT decompression; LIS; monitor and record output - Agree with IV Abx - Monitor renal function - Monitor leukocytosis - Monitor abdominal examination - WOC RN following for ostomy care - Pain control prn; antiemetics prn - Wound Care: Pack open portion of wound with saline moistened packing, change daily - Mobilize  -- Lynden Oxford, PA-C [facility] 01/09/2023, 10:49 AM M-F: 7am - 4pm

## 2023-01-09 NOTE — Consult Note (Addendum)
WOC Nurse ostomy consult note Pt is familiar to WOC team from recent admission; she had colostomy surgery performed 10/26 and had teaching sessions performed with patient and her daughter prior to this admission, most recently on 10/31. She is currently in the ED awaiting readmission and has an NG and is in pain.  Ostomy pouch changed; Pt is concerned related to dark area of the stoma.  Stoma 1 1/2 inches, 80% red and viable, 20% loose dark slough; discussed that that sometimes loose nonviable outer skin to stoma will slough and eventually fall off.  Applied barrier ring and 2 piece pouching system.  Pt was feeling poorly and did not participate; she states she had emptied the pouch independently while at home.  Emptied 50cc liquid brown stool.   Ostomy pouching: 2 pc.  Education provided: Reviewed pouching routines and ordering supplies. Use supplies: barrier ring, Lawson # [phone], wafer Hart [place] # [id number], pouch Lawson # [phone].  Extra supplies left at the bedside for staff nurses use.  Pt could benefit from a referral to the outpatient ostomy clinic after discharge; this must be by physician referral.  Enrolled patient in [facility] DC program: Yes, previously.  WOC Nurse Consult Note: Pt has a post-op midline incision with staples midway down; it has dehisced to a full thickness wound at the top; 3X1X.8cm, 90% red, 10% yellow.  Surgical team assessed wound appearance yesterday and requested WOC provide topical treatment recommendations.  Orders provided for bedside nurses to perform as follows to absorb drainage and provide antimicrobial benefits: Cut piece of Aquacel Hart [place] # [phone]) and tuck into abd wound Q day, then cover with foam dressing.  Change foam dressing q 3 days or PRN soiling.  Moisten previous Aquacel with NS each time to assist with removal. Pt should follow-up with the surgical team after discharge for further plan of care.  WOC team will assess weekly while in the hospital.  Thank-you,  Cammie Mcgee MSN, RN, CWOCN, [place], CNS [phone]

## 2023-01-09 NOTE — H&P (Signed)
Patient ID: Michele Sanders, female   DOB: May 21, 1955, 67 y.o.   MRN: [id number]  Chief Complaint: Nausea and vomiting  History of Present Illness Michele Sanders is a 67 y.o. female with recent discharge following hospitalization for Hartman's procedure for perforated diverticulitis.  Has been home for 2 to 3 days with progressive and persistent nausea with vomiting.  She denies fevers or chills.  She has had some recent discharge from the upper part of her incision.  She has had liquid colostomy output with gas.  Past Medical History Past Medical History:  Diagnosis Date   Arthritis    GERD (gastroesophageal reflux disease)    History of kidney stones    Hyperparathyroidism (HCC)    Hypertension    Pneumonia    PONV (postoperative nausea and vomiting)    Pre-diabetes       Past Surgical History:  Procedure Laterality Date   APPENDECTOMY     BACK SURGERY     L4-L5   BREAST BIOPSY Left 04/09/2019   neg   BREAST CYST ASPIRATION Left 04/08/2019   neg   COLECTOMY WITH COLOSTOMY CREATION/HARTMANN PROCEDURE N/A 12/31/2022   Procedure: LAPAROTOMY WITH COLOSTOMY CREATION/HARTMANN PROCEDURE;  Surgeon: Leafy Ro, MD;  Location: [facility] ORS;  Service: General;  Laterality: N/A;   COLONOSCOPY W/ POLYPECTOMY     CYSTOSCOPY/URETEROSCOPY/HOLMIUM LASER/STENT PLACEMENT Left 09/29/2020   Procedure: CYSTOSCOPY/URETEROSCOPY, /STENT PLACEMENT;  Surgeon: Riki Altes, MD;  Location: [facility] ORS;  Service: Urology;  Laterality: Left;   CYSTOSCOPY/URETEROSCOPY/HOLMIUM LASER/STENT PLACEMENT Left 11/10/2020   Procedure: CYSTOSCOPY/URETEROSCOPY/HOLMIUM LASER/STENT PLACEMENT;  Surgeon: Riki Altes, MD;  Location: [facility] ORS;  Service: Urology;  Laterality: Left;   FRACTURE SURGERY Right    humerus 2005   IR URETERAL STENT LEFT NEW ACCESS W/O SEP NEPHROSTOMY CATH  01/31/2022    No Known Allergies  Current Facility-Administered Medications  Medication Dose Route Frequency Provider Last Rate Last Admin    0.9 %  sodium chloride infusion   Intravenous Continuous Campbell Lerner, MD       diphenhydrAMINE (BENADRYL) 12.5 MG/5ML elixir 12.5 mg  12.5 mg Oral Q6H PRN Campbell Lerner, MD       Or   diphenhydrAMINE (BENADRYL) injection 12.5 mg  12.5 mg Intravenous Q6H PRN Campbell Lerner, MD       enoxaparin (LOVENOX) injection 40 mg  40 mg Subcutaneous Q24H Campbell Lerner, MD       ondansetron (ZOFRAN-ODT) disintegrating tablet 4 mg  4 mg Oral Q6H PRN Campbell Lerner, MD       Or   ondansetron [facility]) injection 4 mg  4 mg Intravenous Q6H PRN Campbell Lerner, MD       pantoprazole (PROTONIX) injection 40 mg  40 mg Intravenous QHS Campbell Lerner, MD       piperacillin-tazobactam (ZOSYN) IVPB 3.375 g  3.375 g Intravenous Q8H Campbell Lerner, MD       vancomycin (VANCOREADY) IVPB 1750 mg/350 mL  1,750 mg Intravenous Once Otelia Sergeant, RPH 175 mL/hr at 01/09/23 0351 1,750 mg at 01/09/23 0351   Current Outpatient Medications  Medication Sig Dispense Refill   acetaminophen (TYLENOL) 500 MG tablet Take 1,000 mg by mouth every 6 (six) hours as needed.     aspirin 81 MG chewable tablet Chew 81 mg by mouth daily.     cholecalciferol (VITAMIN D3) 25 MCG (1000 UNIT) tablet Take 1,000 Units by mouth daily.     cyanocobalamin (VITAMIN B12) 1000 MCG tablet Take 1,000 mcg by mouth daily.  docusate sodium (COLACE) 100 MG capsule Take 1 capsule (100 mg total) by mouth daily as needed. 30 capsule 2   ibuprofen (ADVIL) 200 MG tablet Take 200 mg by mouth every 6 (six) hours as needed. 2 tablets     lisinopril (ZESTRIL) 20 MG tablet Take 20 mg by mouth daily.     ondansetron (ZOFRAN-ODT) 4 MG disintegrating tablet Take 1 tablet (4 mg total) by mouth every 6 (six) hours as needed for nausea. 20 tablet 0   pantoprazole (PROTONIX) 40 MG tablet Take 40 mg by mouth daily.     simvastatin (ZOCOR) 40 MG tablet Take 40 mg by mouth daily at 6 PM.     traMADol (ULTRAM) 50 MG tablet Take 1 tablet (50 mg total) by  mouth every 6 (six) hours as needed for moderate pain (pain score 4-6) or severe pain (pain score 7-10) (mild pain). 30 tablet 0    Family History Family History  Problem Relation Age of Onset   Breast cancer Neg Hx       Social History Social History   Tobacco Use   Smoking status: Every Day    Current packs/day: 0.50    Average packs/day: 0.5 packs/day for 30.0 years (15.0 ttl pk-yrs)    Types: Cigarettes   Smokeless tobacco: Never  Vaping Use   Vaping status: Never Used  Substance Use Topics   Alcohol use: Yes    Comment: wine nightly   Drug use: Never        ROS   Physical Exam Blood pressure (!) 110/58, pulse 85, temperature 98.3 F (36.8 C), temperature source Oral, resp. rate 18, height 5\' 4"  (1.626 m), weight 85.7 kg, SpO2 94%. Last Weight  Most recent update: 01/08/2023  7:32 PM    Weight  85.7 kg (189 lb)             CONSTITUTIONAL: Well developed, and nourished, appropriately responsive and aware without distress.  Somewhat somnolent this morning. EYES: Sclera non-icteric.   EARS, NOSE, MOUTH AND THROAT:  The oropharynx is clear. Oral mucosa is pink and moist.     Hearing is intact to voice.  NECK: Trachea is midline, and there is no jugular venous distension.  LYMPH NODES:  Lymph nodes in the neck are not appreciated. RESPIRATORY:  Lungs are clear, and breath sounds are equal bilaterally. Normal respiratory effort without pathologic use of accessory muscles. CARDIOVASCULAR: Heart is regular in rate and rhythm.   Well perfused.  GI: The abdomen is notable for stapled midline incision with a couple staples Around the umbilicus with some discolored drainage and mild erythema present there.  Otherwise there is an adjacent stoma on the left which has patchy areas of necrosis and slough but otherwise viable with output present in the bag.  Otherwise soft, nontender, and nondistended. There were no palpable masses. MUSCULOSKELETAL:  Symmetrical muscle tone  appreciated in all four extremities.    SKIN: Skin turgor is normal. No pathologic skin lesions appreciated.  NEUROLOGIC:  Motor and sensation appear grossly normal.   PSYCH:  Alert and oriented to person, place and time. Affect is appropriate for situation.  Data Reviewed I have personally reviewed what is currently available of the patient's imaging, recent labs and medical records.   Labs:     Latest Ref Rng & Units 01/08/2023    7:35 PM 01/04/2023    5:30 AM 01/03/2023    5:03 AM  CBC  WBC 4.0 - 10.5 K/uL 18.5  14.5  12.8   Hemoglobin 12.0 - 15.0 g/dL 60.1  09.3  23.5   Hematocrit 36.0 - 46.0 % 41.9  40.4  40.7   Platelets 150 - 400 K/uL 598  427  397       Latest Ref Rng & Units 01/08/2023    7:35 PM 01/05/2023    4:54 AM 01/04/2023    5:30 AM  CMP  Glucose 70 - 99 mg/dL 573   220   BUN 8 - 23 mg/dL 15   19   Creatinine 2.54 - 1.00 mg/dL 2.70  6.23  7.62   Sodium 135 - 145 mmol/L 135   138   Potassium 3.5 - 5.1 mmol/L 4.0   3.6   Chloride 98 - 111 mmol/L 103   101   CO2 22 - 32 mmol/L 20   28   Calcium 8.9 - 10.3 mg/dL 9.2   8.3   Total Protein 6.5 - 8.1 g/dL 8.2     Total Bilirubin 0.3 - 1.2 mg/dL 0.8     Alkaline Phos 38 - 126 U/L 123     AST 15 - 41 U/L 25     ALT 0 - 44 U/L 34         Imaging: Radiological images reviewed:   Within last 24 hrs: DG Abdomen 1 View  Result Date: 01/09/2023 CLINICAL DATA:  NG tube placement EXAM: ABDOMEN - 1 VIEW COMPARISON:  11/12/2021.  CT earlier today FINDINGS: NG tube tip in the mid stomach. IMPRESSION: NG tube in the stomach. Electronically Signed   By: Charlett Nose M.D.   On: 01/09/2023 03:32   CT ABDOMEN PELVIS W CONTRAST  Result Date: 01/09/2023 CLINICAL DATA:  Patient comes to the ER with ongoing abdominal pain nausea. On 12/31/2022 underwent sigmoid colectomy with Hartmann's pouch creation and left lower abdominal descending colostomy. Initially presented with perforated diverticulitis 12/29/2022. EXAM: CT ABDOMEN AND  PELVIS WITH CONTRAST TECHNIQUE: Multidetector CT imaging of the abdomen and pelvis was performed using the standard protocol following bolus administration of intravenous contrast. RADIATION DOSE REDUCTION: This exam was performed according to the departmental dose-optimization program which includes automated exposure control, adjustment of the mA and/or kV according to patient size and/or use of iterative reconstruction technique. CONTRAST:  OMNIPAQUE IOHEXOL 350 MG/ML SOLN COMPARISON:  Preoperative CT with IV contrast 12/29/2022, 09/27/2022. FINDINGS: Lower chest: Lung bases are mildly emphysematous. There is no consolidation or effusion. There is linear atelectasis in the lingular base. Small hiatal hernia.  The cardiac size is normal. Hepatobiliary: The liver is mildly steatotic with improvement in the steatosis, measuring 19.5 cm length. Stable hypodensity measuring 5 mm again noted in segment 5, too small to characterize. There is no mass enhancement. Gallbladder and bile ducts are unremarkable. Pancreas: No abnormality Spleen: No abnormality. Adrenals/Urinary Tract: There is no adrenal mass. 5.3 cm Bosniak 1 cyst again noted in the outer right kidney, additional much smaller cysts bilaterally. Scarring and volume loss again seen in the inferior pole on left as well as left lower pole calyceal stones largest is 1.6 cm. There is no hydronephrosis or ureteral stone. The bladder is contracted and not well seen. Stomach/Bowel: Small hiatal hernia with chronic thickened folds in the proximal stomach. Probable chronic gastritis. There are undigested medication tablets in the duodenum, jejunum and cecum. Prior finding of free intraperitoneal air has resolved. There are midline laparotomy skin staples and interval sigmoid colectomy with Hartmann's pouch creation and left lower quadrant descending colostomy. There is  now dilatation of the upper and left mid to lower abdominal small bowel up to 5 cm,  decompression of the majority of the right abdominal small bowel. The transitional segment abuts the anterior wall in the right lower abdomen a few cm off the midline, on series 2 axial 60-65 and on coronal reconstruction image 25 of series 5, possibly indicating adhesive disease. There is new demonstration of a wide-mouth transmesenteric internal hernia of the ascending mesocolon but the transitional segment is medial to this. Multiple small bowel segments extend out lateral to the ascending colon The cecum is mobile in comparison to the prior studies now located in the pelvis just above the bladder. The appendix again noted surgically absent. Vascular/Lymphatic: Aortic atherosclerosis. No enlarged abdominal or pelvic lymph nodes. Reproductive: Fibroid uterus with largest calcified fibroid 3 cm in the fundus, retroverted. No adnexal mass. Other: Trace ascites. No abdominal wall incarcerated hernia. No abscess, free hemorrhage or free air. Musculoskeletal: Osteopenia and degenerative change of the spine. Slight levoscoliosis lumbar spine. IMPRESSION: 1. Interval sigmoid colectomy with Hartmann's pouch creation and left lower quadrant descending colostomy. 2. Interval development of small-bowel obstruction with transitional segment in the right lower abdomen abutting the anterior wall, possibly adhesive disease. 3. New observation of a wide-mouth transmesenteric internal hernia of the ascending mesocolon but the transitional segment is medial to this. 4. Mobile cecum in comparison to the prior studies now located in the pelvis just above the bladder. 5. Trace ascites. No abscess, free hemorrhage or free air. 6. Emphysema. 7. Aortic atherosclerosis. 8. Nonobstructive left nephrolithiasis. 9. Fibroid uterus and remaining findings described above. Aortic Atherosclerosis (ICD10-I70.0) and Emphysema (ICD10-J43.9). Electronically Signed   By: Almira Bar M.D.   On: 01/09/2023 01:41    Assessment    Small bowel  obstruction versus postoperative ileus. Patient Active Problem List   Diagnosis Date Noted   SBO (small bowel obstruction) (HCC) 01/09/2023   Perforated diverticulum 12/31/2022   Diverticulitis large intestine 12/29/2022   Cigarette smoker one half pack a day or less 10/17/2017   Essential hypertension 10/17/2017   Osteopenia of multiple sites 10/17/2017    Plan    NG tube decompression, Normal saline IV resuscitation/support while patient remains n.p.o. Wound care to involve packing of the periumbilical drainage, which may be secondary to the adjacent stoma. Because of the significant leukocytosis, we will proceed with broad-spectrum antibiotics empirically. Repeat BMP in a.m., follow-up CBC in AM. Face-to-face time spent with the patient and accompanying care providers(if present) was 30 minutes, with more than 50% of the time spent counseling, educating, and coordinating care of the patient.    These notes generated with voice recognition software. I apologize for typographical errors.  Campbell Lerner M.D., FACS 01/09/2023, 4:15 AM

## 2023-01-09 NOTE — ED Notes (Addendum)
Pt ambulated around nurses station and to bathroom, pt denies any pain or nausea at this time. Pt situated in bed, given call light and denied other needs

## 2023-01-09 NOTE — Progress Notes (Signed)
PHARMACY -  BRIEF ANTIBIOTIC NOTE   Pharmacy has received consult(s) for Vancomcyin from an ED provider.  The patient's profile has been reviewed for ht / wt / allergies / indication / available labs.    One time order(s) placed for: Vancomycin 1750 mg per pt wt: 85.7 kg  Further antibiotics/pharmacy consults should be ordered by admitting physician if indicated.                       Thank you, Otelia Sergeant, PharmD, [facility] 01/09/2023 2:34 AM

## 2023-01-10 ENCOUNTER — Inpatient Hospital Stay: Payer: Medicare HMO

## 2023-01-10 LAB — BASIC METABOLIC PANEL
Anion gap: 12 (ref 5–15)
BUN: 17 mg/dL (ref 8–23)
CO2: 21 mmol/L — ABNORMAL LOW (ref 22–32)
Calcium: 8.6 mg/dL — ABNORMAL LOW (ref 8.9–10.3)
Chloride: 107 mmol/L (ref 98–111)
Creatinine, Ser: 0.87 mg/dL (ref 0.44–1.00)
GFR, Estimated: >60 mL/min (ref >60)
Glucose, Bld: 89 mg/dL (ref 70–99)
Potassium: 3.4 mmol/L — ABNORMAL LOW (ref 3.5–5.1)
Sodium: 140 mmol/L (ref 135–145)

## 2023-01-10 LAB — MAGNESIUM: Magnesium: 1.6 mg/dL — ABNORMAL LOW (ref 1.7–2.4)

## 2023-01-10 LAB — CBC
HCT: 36.6 % (ref 36.0–46.0)
Hemoglobin: 12 g/dL (ref 12.0–15.0)
MCH: 29.5 pg (ref 26.0–34.0)
MCHC: 32.8 g/dL (ref 30.0–36.0)
MCV: 89.9 fL (ref 80.0–100.0)
Platelets: 459 10*3/uL — ABNORMAL HIGH (ref 150–400)
RBC: 4.07 MIL/uL (ref 3.87–5.11)
RDW: 13.1 % (ref 11.5–15.5)
WBC: 13.7 10*3/uL — ABNORMAL HIGH (ref 4.0–10.5)
nRBC: 0 % (ref 0.0–0.2)

## 2023-01-10 MED ORDER — POTASSIUM CHLORIDE 10 MEQ/100ML IV SOLN
10.0000 meq | INTRAVENOUS | Status: AC
  Administered 2023-01-10: 10 meq via INTRAVENOUS
  Filled 2023-01-10: qty 100

## 2023-01-10 MED ORDER — HYDRALAZINE HCL 20 MG/ML IJ SOLN
5.0000 mg | Freq: Four times a day (QID) | INTRAMUSCULAR | Status: DC | PRN
  Administered 2023-01-11: 5 mg via INTRAVENOUS
  Filled 2023-01-10: qty 1

## 2023-01-10 MED ORDER — MELATONIN 5 MG PO TABS
5.0000 mg | ORAL_TABLET | Freq: Every evening | ORAL | Status: DC | PRN
  Administered 2023-01-10 – 2023-01-11 (×2): 5 mg via ORAL
  Filled 2023-01-10: qty 1

## 2023-01-10 MED ORDER — MAGNESIUM SULFATE 2 GM/50ML IV SOLN
2.0000 g | Freq: Once | INTRAVENOUS | Status: AC
  Administered 2023-01-10: 2 g via INTRAVENOUS
  Filled 2023-01-10: qty 50

## 2023-01-10 MED ORDER — POTASSIUM CHLORIDE 10 MEQ/100ML IV SOLN
10.0000 meq | INTRAVENOUS | Status: AC
  Administered 2023-01-10 (×2): 10 meq via INTRAVENOUS
  Filled 2023-01-10 (×2): qty 100

## 2023-01-10 MED ORDER — SODIUM CHLORIDE 0.9 % IV SOLN
INTRAVENOUS | Status: AC
Start: 2023-01-10 — End: 2023-01-11

## 2023-01-10 NOTE — TOC Initial Note (Signed)
Transition of Care [facility]) - Initial/Assessment Note    Patient Details  Name: Michele Sanders MRN: [id number] Date of Birth: 10-05-1955  Transition of Care [facility]) CM/SW Contact:    Chapman Fitch, RN Phone Number: 01/10/2023, 3:55 PM  Clinical Narrative:                  Patient recently discharged on 11/1 with new ostomy.  Patient active with [facility] for RN.  Cory with [facility] notified of admission       Patient Goals and CMS Choice            Expected Discharge Plan and Services                                              Prior Living Arrangements/Services                       Activities of Daily Living   ADL Screening (condition at time of admission) Independently performs ADLs?: No Does the patient have a NEW difficulty with bathing/dressing/toileting/self-feeding that is expected to last >3 days?: No Does the patient have a NEW difficulty with getting in/out of bed, walking, or climbing stairs that is expected to last >3 days?: No Does the patient have a NEW difficulty with communication that is expected to last >3 days?: No Is the patient deaf or have difficulty hearing?: No Does the patient have difficulty seeing, even when wearing glasses/contacts?: No Does the patient have difficulty concentrating, remembering, or making decisions?: No  Permission Sought/Granted                  Emotional Assessment              Admission diagnosis:  SBO (small bowel obstruction) (HCC) [K56.609] Postprocedural complete intestinal obstruction [K91.32] Patient Active Problem List   Diagnosis Date Noted   SBO (small bowel obstruction) (HCC) 01/09/2023   Perforated diverticulum 12/31/2022   Diverticulitis large intestine 12/29/2022   Cigarette smoker one half pack a day or less 10/17/2017   Essential hypertension 10/17/2017   Osteopenia of multiple sites 10/17/2017   PCP:  Patrice Paradise, MD Pharmacy:   [facility] [phone] Nicholes Rough, [place] - [address] [place] [address] Phone: [phone] Fax: [phone]  [id number] [place], [place] - [address] [place] [address] [facility] [address] [place] [address] Phone: [phone] Fax: [phone]     Social Determinants of Health (SDOH) Social History: SDOH Screenings   Food Insecurity: No Food Insecurity (01/09/2023)  Housing: Low Risk  (01/09/2023)  Transportation Needs: No Transportation Needs (01/09/2023)  Utilities: Not At Risk (01/09/2023)  Financial Resource Strain: Low Risk  (12/12/2022)   Received from [facility]  Tobacco Use: High Risk (01/09/2023)   SDOH Interventions:     Readmission Risk Interventions     No data to display

## 2023-01-10 NOTE — Progress Notes (Signed)
[facility] SUR[facility]ICAL PROGRESS NOTE  Hospital Day(s): 1.   Interval History:  Patient seen and examined No acute events or new complaints overnight.  Patient reports she is feeling markedly better Abdominal soreness is improved No nausea/emesis No new labs this morning; ordered  NGT output not recorded; about 150 ccs in canister Colostomy with gas this morning  She is on Zosyn   Vital signs in last 24 hours: [min-max] current  Temp:  [97.8 F (36.6 C)-98.2 F (36.8 C)] 98.2 F (36.8 C) (11/04 2241) Pulse Rate:  [73-93] 93 (11/04 2241) Resp:  [14-18] 18 (11/04 2241) BP: (132-151)/(58-77) 148/74 (11/04 2241) SpO2:  [92 %-97 %] 94 % (11/04 2241)     Height: 5\' 4"  (162.6 cm) Weight: 85.7 kg BMI (Calculated): 32.43   Intake/Output last 2 shifts:  11/04 0701 - 11/05 0700 In: 150 [NG/GT:150] Out: 300 [Stool:50]   Physical Exam:  Constitutional: alert, cooperative and no distress  HEENT: NGT in place; output thin and minimal  Respiratory: breathing non-labored at rest  Cardiovascular: regular rate and sinus rhythm  Gastrointestinal: soft, non-tender, and non-distended, no rebound/guarding.  Integumentary: Laparotomy is CDI with staples, ecchymotic. There is a small opening superiorly from previous site of oozing. Penrose in place.    Labs:     Latest Ref Rng & Units 01/09/2023    5:10 AM 01/08/2023    7:35 PM 01/04/2023    5:30 AM  CBC  WBC 4.0 - 10.5 K/uL 18.4  18.5  14.5   Hemoglobin 12.0 - 15.0 g/dL 78.2  95.6  21.3   Hematocrit 36.0 - 46.0 % 36.5  41.9  40.4   Platelets 150 - 400 K/uL 462  598  427       Latest Ref Rng & Units 01/09/2023    5:10 AM 01/08/2023    7:35 PM 01/05/2023    4:54 AM  CMP  Glucose 70 - 99 mg/dL 086  578    BUN 8 - 23 mg/dL 17  15    Creatinine 4.69 - 1.00 mg/dL 6.29  5.28  4.13   Sodium 135 - 145 mmol/L 135  135    Potassium 3.5 - 5.1 mmol/L 4.7  4.0    Chloride 98 - 111 mmol/L 101  103    CO2 22 - 32 mmol/L 22  20     Calcium 8.9 - 10.3 mg/dL 8.4  9.2    Total Protein 6.5 - 8.1 g/dL  8.2    Total Bilirubin 0.3 - 1.2 mg/dL  0.8    Alkaline Phos 38 - 126 U/L  123    AST 15 - 41 U/L  25    ALT 0 - 44 U/L  34       Imaging studies:   KUB (01/10/2023) personally reviewed still with distended loop of small bowel in RUQ, there is gas in the colon, no free air, and radiologist report pending...    Assessment/Plan:  67 y.o. female with likely post-operative ileus s/p Hartman's procedure for perforated diverticulitis on 10/26   - Clinically seems to be improving, with gas in ostomy. We will plan to keep NGT another 24 hours. If she continues to do well, we can proceed with clamping trial tomorrow (11/06)  - No need for surgical re-intervention  - Continue NPO + IVF support  - Monitor abdominal examination; on-going bowel function  - Pain control prn; antiemetics prn  - Follow up labs    - Mobilize as tolerated  All of the above findings and recommendations were discussed with the patient, patient's family at bedside and on the phone, and the medical team, and all of patient's and family's questions were answered to their expressed satisfaction.  -- Lynden Oxford, PA-C [facility] 01/10/2023, 9:12 AM M-F: 7am - 4pm

## 2023-01-11 ENCOUNTER — Inpatient Hospital Stay: Payer: Medicare HMO

## 2023-01-11 LAB — MAGNESIUM: Magnesium: 1.8 mg/dL (ref 1.7–2.4)

## 2023-01-11 LAB — BASIC METABOLIC PANEL
Anion gap: 12 (ref 5–15)
BUN: 13 mg/dL (ref 8–23)
CO2: 22 mmol/L (ref 22–32)
Calcium: 8.3 mg/dL — ABNORMAL LOW (ref 8.9–10.3)
Chloride: 104 mmol/L (ref 98–111)
Creatinine, Ser: 0.72 mg/dL (ref 0.44–1.00)
GFR, Estimated: >60 mL/min (ref >60)
Glucose, Bld: 70 mg/dL (ref 70–99)
Potassium: 3.4 mmol/L — ABNORMAL LOW (ref 3.5–5.1)
Sodium: 138 mmol/L (ref 135–145)

## 2023-01-11 MED ORDER — POTASSIUM CHLORIDE CRYS ER 20 MEQ PO TBCR
20.0000 meq | EXTENDED_RELEASE_TABLET | Freq: Two times a day (BID) | ORAL | Status: DC
  Administered 2023-01-11 – 2023-01-12 (×3): 20 meq via ORAL
  Filled 2023-01-11 (×3): qty 1

## 2023-01-11 NOTE — Progress Notes (Signed)
[facility]ESS [facility]  Hospital Day(s): 2.   Interval History:  Patient seen and examined No acute events or new complaints overnight.  Patient reports she is feeling better No abdominal pain No nausea/emesis Mild hypokalemia to 3.4 o/w labs reassuring  NGT output not recorded; about 100 ccs in canister KUB is improving Colostomy with gas this morning She is on Zosyn   Vital signs in last 24 hours: [min-max] current  Temp:  [97.5 F (36.4 C)-98.4 F (36.9 C)] 97.5 F (36.4 C) (11/06 0432) Pulse Rate:  [70-86] 79 (11/06 0531) Resp:  [18] 18 (11/06 0432) BP: (160-183)/(72-92) 160/72 (11/06 0531) SpO2:  [96 %-97 %] 97 % (11/06 0432)     Height: 5\' 4"  (162.6 cm) Weight: 85.7 kg BMI (Calculated): 32.43   Intake/Output last 2 shifts:  11/05 0701 - 11/06 0700 In: 1015.1 [I.V.:805; IV Piggyback:210.1] Out: 350 [Emesis/NG output:100; Stool:250]   Physical Exam:  Constitutional: alert, cooperative and no distress  HEENT: NGT in place; output thin and minimal  Respiratory: breathing non-labored at rest  Cardiovascular: regular rate and sinus rhythm  Gastrointestinal: soft, non-tender, and non-distended, no rebound/guarding.  Integumentary: Laparotomy is CDI with staples, ecchymotic. There is a small opening superiorly from previous site of oozing.    Labs:     Latest Ref Rng & Units 01/10/2023    9:34 AM 01/09/2023    5:10 AM 01/08/2023    7:35 PM  CBC  WBC 4.0 - 10.5 K/uL 13.7  18.4  18.5   Hemoglobin 12.0 - 15.0 g/dL 16.1  09.6  04.5   Hematocrit 36.0 - 46.0 % 36.6  36.5  41.9   Platelets 150 - 400 K/uL 459  462  598       Latest Ref Rng & Units 01/11/2023    4:50 AM 01/10/2023    9:34 AM 01/09/2023    5:10 AM  CMP  Glucose 70 - 99 mg/dL 70  89  409   BUN 8 - 23 mg/dL 13  17  17    Creatinine 0.44 - 1.00 mg/dL 8.11  9.14  7.82   Sodium 135 - 145 mmol/L 138  140  135   Potassium 3.5 - 5.1 mmol/L 3.4  3.4  4.7   Chloride 98 - 111 mmol/L 104   107  101   CO2 22 - 32 mmol/L 22  21  22    Calcium 8.9 - 10.3 mg/dL 8.3  8.6  8.4      Imaging studies:   KUB (01/11/2023) personally reviewed still with small, improved loop of bowel dilation in RUQ, air throughout colon, and no free air, and radiologist report pending...    Assessment/Plan:  67 y.o. female with likely post-operative ileus s/p Hartman's procedure for perforated diverticulitis on 10/26   - Will proceed with NGT clamping trial this morning. Clamp NGT x4 hours and check residuals. If residuals are <150 ccs, we can remove NG and start CLD.   - No need for surgical re-intervention  - Continue NPO for now + IVF support  - Monitor abdominal examination; on-going bowel function  - Pain control prn; antiemetics prn  - Replete K  - Mobilize as tolerated     All of the above findings and recommendations were discussed with the patient, patient's family at bedside and on the phone, and the medical team, and all of patient's and family's questions were answered to their expressed satisfaction.  -- Lynden Oxford, PA-C [facility] 01/11/2023, 7:34  AM M-F: 7am - 4pm

## 2023-01-11 NOTE — Plan of Care (Signed)

## 2023-01-11 NOTE — Plan of Care (Signed)

## 2023-01-12 MED ORDER — LISINOPRIL 20 MG PO TABS
20.0000 mg | ORAL_TABLET | Freq: Every day | ORAL | Status: DC
  Administered 2023-01-12: 20 mg via ORAL
  Filled 2023-01-12: qty 1

## 2023-01-12 MED ORDER — PANTOPRAZOLE SODIUM 40 MG PO TBEC: 40.0000 mg | DELAYED_RELEASE_TABLET | Freq: Every day | ORAL | Status: DC

## 2023-01-12 NOTE — Progress Notes (Signed)
PHARMACIST - PHYSICIAN COMMUNICATION  DR:   Everlene Farrier  CONCERNING: IV to Oral Route Change Policy  RECOMMENDATION: This patient is receiving pantoprazole by the intravenous route.  Based on criteria approved by the Pharmacy and Therapeutics Committee, the intravenous medication(s) is/are being converted to the equivalent oral dose form(s).   DESCRIPTION: These criteria include: The patient is eating (either orally or via tube) and/or has been taking other orally administered medications for a least 24 hours The patient has no evidence of active gastrointestinal bleeding or impaired GI absorption (gastrectomy, short bowel, patient on TNA or NPO).  If you have questions about this conversion, please contact the Pharmacy Department    Barrie Folk, [facility] 01/12/2023 11:25 AM

## 2023-01-12 NOTE — Consult Note (Signed)
WOC Nurse ostomy follow up Stoma type/location: LMQ colostomy  Stomal assessment/size: sloughing mucosa (see photo)  small stomal separation, productive of liquid brown stool Peristomal assessment: stomal separation from 10 to 12 o'clock, protected with barrier ring   midline staple line, open at the proximal end, packed with silver hydrofiber.  Treatment options for stomal/peristomal skin: barrier ring and 2 piece pouch Output liquid brown stool (75 ml)  Ostomy pouching: 2pc. With barrier ring   Education provided: We perform pouch change.  Patient measures stoma and cuts barrier to fit. Applies barrier ring with minimal assistance  Assembles pouch and barrier and applies to skin.  Able to open and roll closed.  Understands to empty when 1/3 full.  She has pouches for home and is scheduled for outpatient ostomy clinic next Tuesday.  Enrolled patient in [facility] Secure Start Discharge program: Yes Will not follow at this time.  Please re-consult if needed.  Mike Gip MSN, RN, FNP-BC CWON Wound, Ostomy, Continence Nurse Outpatient [facility] [phone] Pager [phone]

## 2023-01-12 NOTE — Discharge Summary (Signed)
[facility] SURGICAL DISCHARGE SUMMARY  Patient ID: Michele Sanders MRN: [id number] DOB/AGE: [birth year] 67 y.o.  Admit date: 01/08/2023 Discharge date: 01/12/2023  Discharge Diagnoses Patient Active Problem List   Diagnosis Date Noted   SBO (small bowel obstruction) (HCC) 01/09/2023   Perforated diverticulum 12/31/2022   Diverticulitis large intestine 12/29/2022    Consultants None  Procedures None  HPI / Hospital Course: Michele Sanders is a 67 y.o. female with recent Hartman's Procedure on 10/26 with Dr Everlene Farrier. Initially discharged on 11/01 however returned to the ED on 11/03 secondary to nausea. She was found to have an ileus. She did get NGT placed. This was removed on 11/06 after passing clamping trial. She never required TPN. Advancement of patient's diet and ambulation were well-tolerated. The remainder of patient's hospital course was essentially unremarkable, and discharge planning was initiated accordingly with patient safely able to be discharged home with appropriate discharge instructions, pain control, and outpatient follow-up after all of her questions were answered to her expressed satisfaction.   Discharge Condition: Good  Physical Examination:  Constitutional: alert, cooperative and no distress  Respiratory: breathing non-labored at rest  Cardiovascular: regular rate and sinus rhythm  Gastrointestinal: soft, non-tender, and non-distended, no rebound/guarding. Colostomy in left abdomen, pink, patent, gas and stool in bag.  Integumentary: Laparotomy is CDI with staples, ecchymotic. There is a small opening superiorly from previous site of oozing.     Allergies as of 01/12/2023   No Known Allergies      Medication List     TAKE these medications    acetaminophen 500 MG tablet Commonly known as: TYLENOL Take 1,000 mg by mouth every 6 (six) hours as needed.   aspirin 81 MG chewable tablet Chew 81 mg by mouth daily.   cholecalciferol 25 MCG (1000  UNIT) tablet Commonly known as: VITAMIN D3 Take 1,000 Units by mouth daily.   cyanocobalamin 1000 MCG tablet Commonly known as: VITAMIN B12 Take 1,000 mcg by mouth daily.   docusate sodium 100 MG capsule Commonly known as: Colace Take 1 capsule (100 mg total) by mouth daily as needed.   ibuprofen 200 MG tablet Commonly known as: ADVIL Take 200 mg by mouth every 6 (six) hours as needed. 2 tablets   lisinopril 20 MG tablet Commonly known as: ZESTRIL Take 20 mg by mouth daily.   ondansetron 4 MG disintegrating tablet Commonly known as: ZOFRAN-ODT Take 1 tablet (4 mg total) by mouth every 6 (six) hours as needed for nausea.   pantoprazole 40 MG tablet Commonly known as: PROTONIX Take 40 mg by mouth daily.   simvastatin 40 MG tablet Commonly known as: ZOCOR Take 40 mg by mouth daily at 6 PM.   traMADol 50 MG tablet Commonly known as: ULTRAM Take 1 tablet (50 mg total) by mouth every 6 (six) hours as needed for moderate pain (pain score 4-6) or severe pain (pain score 7-10) (mild pain).          Follow-up Information     Leafy Ro, MD. Go on 01/16/2023.   Specialty: General Surgery Why: Go to appointment on 11/11 at 315 PM Contact information: [address] [place] [address] [phone]                  Time spent on discharge management including discussion of hospital course, clinical condition, outpatient instructions, prescriptions, and follow up with the patient and members of the medical team: >30 minutes  -- Lynden Oxford , PA-C [facility]  01/12/2023, 4:17 PM [phone] M-F: 7am - 4pm

## 2023-01-12 NOTE — Discharge Instructions (Signed)
In addition to included general post-operative instructions,  Diet: Okay to continue full liquid diet at home as long as needed. Okay to gradually advance diet at home if you continue to feel well without nausea/emesis and ostomy continues to function. I will leave handouts regarding this.   Activity: No heavy lifting >20 pounds (children, pets, laundry, garbage) or strenuous activity for 6 weeks from date of surgery, but light activity and walking are encouraged. Do not drive or drink alcohol if taking narcotic pain medications or having pain that might distract from driving.  Wound care: Keep the open portion of your midline wound covered. Okay to pack with dry gauze until depth disappears. Okay to utilize mediplex gauze superficially, gauze and tape, or band-aids to cover. Packing and dressing can be removed to shower, replace after. No bathing.   Colostomy: Okay to shower with ostomy appliance in place. You may also remove this prior to a shower and change appliance after. You can change the ostomy appliance every 3-7 days and as needed.   Medications: Resume all home medications. For mild to moderate pain: acetaminophen (Tylenol) or ibuprofen/naproxen (if no kidney disease). Combining Tylenol with alcohol can substantially increase your risk of causing liver disease. Narcotic pain medications, if prescribed, can be used for severe pain, though may cause nausea, constipation, and drowsiness. Do not combine Tylenol and Percocet (or similar) within a 6 hour period as Percocet (and similar) contain(s) Tylenol. If you do not need the narcotic pain medication, you do not need to fill the prescription.  Call office [phone] - Dr Hurman Horn Office) at any time if any questions, worsening pain, fevers/chills, bleeding, drainage from incision site, or other concerns.

## 2023-01-12 NOTE — Progress Notes (Signed)
Michele Sanders to be D/C'd Home per MD order.  Discussed prescriptions and follow up appointments with the patient. Prescriptions given to patient, medication list explained in detail. Pt verbalized understanding.  Allergies as of 01/12/2023   No Known Allergies      Medication List     TAKE these medications    acetaminophen 500 MG tablet Commonly known as: TYLENOL Take 1,000 mg by mouth every 6 (six) hours as needed.   aspirin 81 MG chewable tablet Chew 81 mg by mouth daily.   cholecalciferol 25 MCG (1000 UNIT) tablet Commonly known as: VITAMIN D3 Take 1,000 Units by mouth daily.   cyanocobalamin 1000 MCG tablet Commonly known as: VITAMIN B12 Take 1,000 mcg by mouth daily.   docusate sodium 100 MG capsule Commonly known as: Colace Take 1 capsule (100 mg total) by mouth daily as needed.   ibuprofen 200 MG tablet Commonly known as: ADVIL Take 200 mg by mouth every 6 (six) hours as needed. 2 tablets   lisinopril 20 MG tablet Commonly known as: ZESTRIL Take 20 mg by mouth daily.   ondansetron 4 MG disintegrating tablet Commonly known as: ZOFRAN-ODT Take 1 tablet (4 mg total) by mouth every 6 (six) hours as needed for nausea.   pantoprazole 40 MG tablet Commonly known as: PROTONIX Take 40 mg by mouth daily.   simvastatin 40 MG tablet Commonly known as: ZOCOR Take 40 mg by mouth daily at 6 PM.   traMADol 50 MG tablet Commonly known as: ULTRAM Take 1 tablet (50 mg total) by mouth every 6 (six) hours as needed for moderate pain (pain score 4-6) or severe pain (pain score 7-10) (mild pain).        Vitals:   01/12/23 0822 01/12/23 1608  BP: (!) 181/89 (!) 166/76  Pulse: 66 66  Resp: 16 18  Temp: 98.2 F (36.8 C) 97.9 F (36.6 C)  SpO2: 94% 97%    Skin clean, dry and intact without evidence of skin break down, no evidence of skin tears noted. IV catheter discontinued intact. Site without signs and symptoms of complications. Dressing and pressure applied. Pt  denies pain at this time. No complaints noted.  An After Visit Summary was printed and given to the patient. Patient escorted via WC, and D/C home via private auto.  Michele Sanders Michele Sanders

## 2023-01-14 LAB — AEROBIC/ANAEROBIC CULTURE W GRAM STAIN (SURGICAL/DEEP WOUND): Culture: NO GROWTH

## 2023-01-16 ENCOUNTER — Encounter: Payer: Self-pay | Admitting: Surgery

## 2023-01-16 ENCOUNTER — Ambulatory Visit: Payer: Medicare HMO | Admitting: Surgery

## 2023-01-16 VITALS — BP 116/73 | HR 81 | Temp 98.2°F | Ht 64.0 in | Wt 178.0 lb

## 2023-01-16 DIAGNOSIS — Z09 Encounter for follow-up examination after completed treatment for conditions other than malignant neoplasm: Secondary | ICD-10-CM

## 2023-01-16 DIAGNOSIS — K5720 Diverticulitis of large intestine with perforation and abscess without bleeding: Secondary | ICD-10-CM

## 2023-01-16 NOTE — Patient Instructions (Signed)
Continue to change the packing dressing every day. You may shower, remove the packing before you shower, let the warm soapy water run over the area, rinse well, and repack and dress.  Follow up here in 3 weeks.

## 2023-01-17 ENCOUNTER — Other Ambulatory Visit (HOSPITAL_COMMUNITY): Payer: Self-pay | Admitting: Nurse Practitioner

## 2023-01-17 ENCOUNTER — Ambulatory Visit (HOSPITAL_COMMUNITY)
Admit: 2023-01-17 | Discharge: 2023-01-17 | Disposition: A | Discharge status: Home / Self Care | Payer: Medicare HMO | Source: Ambulatory Visit | Attending: Physician Assistant | Admitting: Physician Assistant

## 2023-01-17 DIAGNOSIS — K9400 Colostomy complication, unspecified: Secondary | ICD-10-CM | POA: Diagnosis not present

## 2023-01-17 DIAGNOSIS — T8131XD Disruption of external operation (surgical) wound, not elsewhere classified, subsequent encounter: Secondary | ICD-10-CM

## 2023-01-17 DIAGNOSIS — T8131XA Disruption of external operation (surgical) wound, not elsewhere classified, initial encounter: Secondary | ICD-10-CM | POA: Insufficient documentation

## 2023-01-17 DIAGNOSIS — Z933 Colostomy status: Secondary | ICD-10-CM | POA: Insufficient documentation

## 2023-01-17 DIAGNOSIS — T81329A Deep disruption or dehiscence of operation wound, unspecified, initial encounter: Secondary | ICD-10-CM | POA: Insufficient documentation

## 2023-01-17 NOTE — Discharge Instructions (Signed)
Will set up with [facility] 2 3/4" pouches  clear with filter Barrier ring

## 2023-01-17 NOTE — Progress Notes (Signed)
[facility]   Reason for visit:  LMQ colostomy  HPI:  Perforated diverticulum with end colostomy.  Recent admission for small bowel obstruction Past Medical History:  Diagnosis Date   Arthritis    GERD (gastroesophageal reflux disease)    History of kidney stones    Hyperparathyroidism (HCC)    Hypertension    Pneumonia    PONV (postoperative nausea and vomiting)    Pre-diabetes    Family History  Problem Relation Age of Onset   Breast cancer Neg Hx    No Known Allergies Current Outpatient Medications  Medication Sig Dispense Refill Last Dose   acetaminophen (TYLENOL) 500 MG tablet Take 1,000 mg by mouth every 6 (six) hours as needed.      aspirin 81 MG chewable tablet Chew 81 mg by mouth daily.      cholecalciferol (VITAMIN D3) 25 MCG (1000 UNIT) tablet Take 1,000 Units by mouth daily.      cyanocobalamin (VITAMIN B12) 1000 MCG tablet Take 1,000 mcg by mouth daily.      docusate sodium (COLACE) 100 MG capsule Take 1 capsule (100 mg total) by mouth daily as needed. 30 capsule 2    ibuprofen (ADVIL) 200 MG tablet Take 200 mg by mouth every 6 (six) hours as needed. 2 tablets      lisinopril (ZESTRIL) 20 MG tablet Take 20 mg by mouth daily.      ondansetron (ZOFRAN-ODT) 4 MG disintegrating tablet Take 1 tablet (4 mg total) by mouth every 6 (six) hours as needed for nausea. 20 tablet 0    pantoprazole (PROTONIX) 40 MG tablet Take 40 mg by mouth daily.      simvastatin (ZOCOR) 40 MG tablet Take 40 mg by mouth daily at 6 PM.      traMADol (ULTRAM) 50 MG tablet Take 1 tablet (50 mg total) by mouth every 6 (six) hours as needed for moderate pain (pain score 4-6) or severe pain (pain score 7-10) (mild pain). 30 tablet 0    No current facility-administered medications for this encounter.   ROS  Review of Systems  Constitutional:  Positive for fatigue.  Gastrointestinal:        LLQ colostomy  Skin:  Positive for wound.       Midline staple line dehiscence at proximal  end.   Psychiatric/Behavioral: Negative.    All other systems reviewed and are negative.  Vital signs:  BP (!) 114/93 (BP Location: Right Arm)   Pulse 85   Temp 98.7 F (37.1 C) (Oral)   Resp 20   SpO2 97%  Exam:  Physical Exam Vitals reviewed.  Constitutional:      Appearance: Normal appearance.  HENT:     Mouth/Throat:     Mouth: Mucous membranes are moist.  Abdominal:     Palpations: Abdomen is soft.  Skin:    General: Skin is warm and dry.     Findings: Bruising present.     Comments: Bruising to lower abdomen from SQ injections in hospital .  Neurological:     Mental Status: She is alert and oriented to person, place, and time.  Psychiatric:        Mood and Affect: Mood normal.        Behavior: Behavior normal.     Stoma type/location:  LLQ colostomy   Budded pink and moist.  Necrotic mucosa is sloughing away. Has decreased in size by 50%  Stomal assessment/size:  1 3/8" budded pink and moist  Peristomal assessment:  midline staple line with dehiscence at proximal end. Daily wound care to this area.  Treatment options for stomal/peristomal skin: Barrier ring and 2 piece pouch 2 1/4"  Output: soft brown stool Ostomy pouching: 2pc. With barrier ring  Education provided:  Patient able to remove pouch and clean around stoma.  Applied barrier ring.  Is able to cut barrier opening and assemble pouch and barrier.  She applied pouch with minimal assistance.  She is gaining independence.  She is here with her daughter who lives out of town and is not participative in ostomy care.  Midline staple line dehiscence is cleansed with NS and filled with aquacel.  Topped with foam dressing.     Impression/dx  Colostomy Wound dehiscence Discussion  WE discuss ostomy care and wound care.  Patient is feeling more confident about her self care at this time.  Plan  See back 2 weeks.      Visit time: 55 minutes.   Maple Hudson FNP-BC

## 2023-01-19 NOTE — Progress Notes (Signed)
Outpatient Surgical Follow Up    Michele Sanders is an 67 y.o. female.   Chief Complaint  Patient presents with   Routine Post Op    HPI: s/p hartmann for perf diverticulitis on 10/26 readmitted w ileus. DOing well and recovering, taking PO, ostomy working, no fevers, ambulating   Past Medical History:  Diagnosis Date   Arthritis    GERD (gastroesophageal reflux disease)    History of kidney stones    Hyperparathyroidism (HCC)    Hypertension    Pneumonia    PONV (postoperative nausea and vomiting)    Pre-diabetes     Past Surgical History:  Procedure Laterality Date   APPENDECTOMY     BACK SURGERY     L4-L5   BREAST BIOPSY Left 04/09/2019   neg   BREAST CYST ASPIRATION Left 04/08/2019   neg   COLECTOMY WITH COLOSTOMY CREATION/HARTMANN PROCEDURE N/A 12/31/2022   Procedure: LAPAROTOMY WITH COLOSTOMY CREATION/HARTMANN PROCEDURE;  Surgeon: Leafy Ro, MD;  Location: [facility] OR[facility];  Service: General;  Laterality: N/A;   COLONOSCOPY W/ POLYPECTOMY     CYSTOSCOPY/URETEROSCOPY/HOLMIUM LASER/STENT PLACEMENT Left 09/29/2020   Procedure: CYSTOSCOPY/URETEROSCOPY, /STENT PLACEMENT;  Surgeon: Riki Altes, MD;  Location: [facility] OR[facility];  Service: Urology;  Laterality: Left;   CYSTOSCOPY/URETEROSCOPY/HOLMIUM LASER/STENT PLACEMENT Left 11/10/2020   Procedure: CYSTOSCOPY/URETEROSCOPY/HOLMIUM LASER/STENT PLACEMENT;  Surgeon: Riki Altes, MD;  Location: [facility] OR[facility];  Service: Urology;  Laterality: Left;   FRACTURE SURGERY Right    humerus 2005   IR URETERAL STENT LEFT NEW ACCESS W/O SEP NEPHROSTOMY CATH  01/31/2022    Family History  Problem Relation Age of Onset   Breast cancer Neg Hx     Social History:  reports that she has quit smoking. Her smoking use included cigarettes. She has a 15 pack-year smoking history. She has been exposed to tobacco smoke. She has never used smokeless tobacco. She reports current alcohol use. She reports that she does not use drugs.  Allergies: No Known  Allergies  Medications reviewed.    ROS Full ROS performed and is otherwise negative other than what is stated in HPI   BP 116/73   Pulse 81   Temp 98.2 F (36.8 C)   Ht 5\' 4"  (1.626 m)   Wt 178 lb (80.7 kg)   SpO2 97%   BMI 30.55 kg/m   Physical Exam NAD alert Abd: soft, incision healing well, 2 cm open wound w some evidence of silver nitrate stain, no infection, staples removed. OStomy pink and patent w stool.  Assessment/Plan: DOing welll w/o complications RTC in a few weeks  Sterling Big, MD [facility] General Surgeon

## 2023-01-21 ENCOUNTER — Emergency Department: Payer: Medicare HMO

## 2023-01-21 ENCOUNTER — Other Ambulatory Visit: Payer: Self-pay

## 2023-01-21 ENCOUNTER — Inpatient Hospital Stay
Admission: EM | Admit: 2023-01-21 | Discharge: 2023-01-23 | DRG: 554 | Disposition: A | Discharge status: Home / Self Care | Payer: Medicare HMO | Attending: Internal Medicine | Admitting: Internal Medicine

## 2023-01-21 DIAGNOSIS — M25451 Effusion, right hip: Secondary | ICD-10-CM | POA: Diagnosis present

## 2023-01-21 DIAGNOSIS — E669 Obesity, unspecified: Secondary | ICD-10-CM | POA: Diagnosis present

## 2023-01-21 DIAGNOSIS — Y838 Other surgical procedures as the cause of abnormal reaction of the patient, or of later complication, without mention of misadventure at the time of the procedure: Secondary | ICD-10-CM | POA: Diagnosis present

## 2023-01-21 DIAGNOSIS — T8131XA Disruption of external operation (surgical) wound, not elsewhere classified, initial encounter: Secondary | ICD-10-CM | POA: Diagnosis present

## 2023-01-21 DIAGNOSIS — F17210 Nicotine dependence, cigarettes, uncomplicated: Secondary | ICD-10-CM | POA: Diagnosis present

## 2023-01-21 DIAGNOSIS — M25551 Pain in right hip: Principal | ICD-10-CM | POA: Diagnosis present

## 2023-01-21 DIAGNOSIS — R7303 Prediabetes: Secondary | ICD-10-CM | POA: Diagnosis present

## 2023-01-21 DIAGNOSIS — Z6830 Body mass index (BMI) 30.0-30.9, adult: Secondary | ICD-10-CM

## 2023-01-21 DIAGNOSIS — Z79899 Other long term (current) drug therapy: Secondary | ICD-10-CM

## 2023-01-21 DIAGNOSIS — Z87442 Personal history of urinary calculi: Secondary | ICD-10-CM

## 2023-01-21 DIAGNOSIS — Z933 Colostomy status: Secondary | ICD-10-CM | POA: Diagnosis not present

## 2023-01-21 DIAGNOSIS — M11251 Other chondrocalcinosis, right hip: Principal | ICD-10-CM | POA: Insufficient documentation

## 2023-01-21 DIAGNOSIS — E785 Hyperlipidemia, unspecified: Secondary | ICD-10-CM | POA: Diagnosis present

## 2023-01-21 DIAGNOSIS — T81329A Deep disruption or dehiscence of operation wound, unspecified, initial encounter: Secondary | ICD-10-CM | POA: Diagnosis present

## 2023-01-21 DIAGNOSIS — Z7982 Long term (current) use of aspirin: Secondary | ICD-10-CM | POA: Diagnosis not present

## 2023-01-21 DIAGNOSIS — I10 Essential (primary) hypertension: Secondary | ICD-10-CM | POA: Diagnosis present

## 2023-01-21 DIAGNOSIS — R000 Tachycardia, unspecified: Secondary | ICD-10-CM | POA: Diagnosis present

## 2023-01-21 DIAGNOSIS — T8131XS Disruption of external operation (surgical) wound, not elsewhere classified, sequela: Secondary | ICD-10-CM | POA: Diagnosis not present

## 2023-01-21 DIAGNOSIS — I2510 Atherosclerotic heart disease of native coronary artery without angina pectoris: Secondary | ICD-10-CM | POA: Diagnosis present

## 2023-01-21 DIAGNOSIS — M009 Pyogenic arthritis, unspecified: Secondary | ICD-10-CM | POA: Diagnosis present

## 2023-01-21 DIAGNOSIS — E876 Hypokalemia: Secondary | ICD-10-CM | POA: Diagnosis present

## 2023-01-21 DIAGNOSIS — J449 Chronic obstructive pulmonary disease, unspecified: Secondary | ICD-10-CM | POA: Diagnosis present

## 2023-01-21 DIAGNOSIS — L98499 Non-pressure chronic ulcer of skin of other sites with unspecified severity: Secondary | ICD-10-CM | POA: Diagnosis present

## 2023-01-21 LAB — URINALYSIS, ROUTINE W REFLEX MICROSCOPIC
Bacteria, UA: NONE SEEN
Bilirubin Urine: NEGATIVE
Glucose, UA: NEGATIVE mg/dL
Ketones, ur: NEGATIVE mg/dL
Leukocytes,Ua: NEGATIVE
Nitrite: NEGATIVE
Protein, ur: NEGATIVE mg/dL
Specific Gravity, Urine: 1.042 — ABNORMAL HIGH (ref 1.005–1.030)
pH: 8 (ref 5.0–8.0)

## 2023-01-21 LAB — SEDIMENTATION RATE: Sed Rate: 33 mm/h — ABNORMAL HIGH (ref 0–30)

## 2023-01-21 LAB — CBC WITH DIFFERENTIAL/PLATELET
Abs Immature Granulocytes: 0.06 10*3/uL (ref 0.00–0.07)
Basophils Absolute: 0.1 10*3/uL (ref 0.0–0.1)
Basophils Relative: 1 %
Eosinophils Absolute: 0.1 10*3/uL (ref 0.0–0.5)
Eosinophils Relative: 1 %
HCT: 36.2 % (ref 36.0–46.0)
Hemoglobin: 12.1 g/dL (ref 12.0–15.0)
Immature Granulocytes: 0 %
Lymphocytes Relative: 11 %
Lymphs Abs: 1.8 10*3/uL (ref 0.7–4.0)
MCH: 29.1 pg (ref 26.0–34.0)
MCHC: 33.4 g/dL (ref 30.0–36.0)
MCV: 87 fL (ref 80.0–100.0)
Monocytes Absolute: 1.4 10*3/uL — ABNORMAL HIGH (ref 0.1–1.0)
Monocytes Relative: 8 %
Neutro Abs: 12.9 10*3/uL — ABNORMAL HIGH (ref 1.7–7.7)
Neutrophils Relative %: 79 %
Platelets: 305 10*3/uL (ref 150–400)
RBC: 4.16 MIL/uL (ref 3.87–5.11)
RDW: 13 % (ref 11.5–15.5)
WBC: 16.3 10*3/uL — ABNORMAL HIGH (ref 4.0–10.5)
nRBC: 0 % (ref 0.0–0.2)

## 2023-01-21 LAB — COMPREHENSIVE METABOLIC PANEL
ALT: 38 U/L (ref 0–44)
AST: 26 U/L (ref 15–41)
Albumin: 3.7 g/dL (ref 3.5–5.0)
Alkaline Phosphatase: 92 U/L (ref 38–126)
Anion gap: 11 (ref 5–15)
BUN: 13 mg/dL (ref 8–23)
CO2: 24 mmol/L (ref 22–32)
Calcium: 7.6 mg/dL — ABNORMAL LOW (ref 8.9–10.3)
Chloride: 103 mmol/L (ref 98–111)
Creatinine, Ser: 0.66 mg/dL (ref 0.44–1.00)
GFR, Estimated: >60 mL/min (ref >60)
Glucose, Bld: 122 mg/dL — ABNORMAL HIGH (ref 70–99)
Potassium: 3.2 mmol/L — ABNORMAL LOW (ref 3.5–5.1)
Sodium: 138 mmol/L (ref 135–145)
Total Bilirubin: 0.7 mg/dL (ref <1.2)
Total Protein: 6.8 g/dL (ref 6.5–8.1)

## 2023-01-21 LAB — LIPASE, BLOOD: Lipase: 27 U/L (ref 11–51)

## 2023-01-21 LAB — PROCALCITONIN: Procalcitonin: <0.1 ng/mL

## 2023-01-21 MED ORDER — ENOXAPARIN SODIUM 40 MG/0.4ML IJ SOSY
40.0000 mg | PREFILLED_SYRINGE | INTRAMUSCULAR | Status: DC
  Administered 2023-01-21 – 2023-01-22 (×2): 40 mg via SUBCUTANEOUS
  Filled 2023-01-21 (×2): qty 0.4

## 2023-01-21 MED ORDER — PANTOPRAZOLE SODIUM 40 MG PO TBEC
40.0000 mg | DELAYED_RELEASE_TABLET | Freq: Every day | ORAL | Status: DC
Start: 2023-01-22 — End: 2023-01-23
  Administered 2023-01-23: 40 mg via ORAL
  Filled 2023-01-21: qty 1

## 2023-01-21 MED ORDER — ONDANSETRON 4 MG PO TBDP: 4.0000 mg | ORAL_TABLET | Freq: Four times a day (QID) | ORAL | Status: DC | PRN

## 2023-01-21 MED ORDER — MORPHINE SULFATE (PF) 4 MG/ML IV SOLN
4.0000 mg | Freq: Once | INTRAVENOUS | Status: AC
  Administered 2023-01-21: 4 mg via INTRAVENOUS
  Filled 2023-01-21: qty 1

## 2023-01-21 MED ORDER — ASPIRIN 81 MG PO CHEW
81.0000 mg | CHEWABLE_TABLET | Freq: Every day | ORAL | Status: DC
  Administered 2023-01-21 – 2023-01-23 (×2): 81 mg via ORAL
  Filled 2023-01-21 (×2): qty 1

## 2023-01-21 MED ORDER — LISINOPRIL 10 MG PO TABS
20.0000 mg | ORAL_TABLET | Freq: Every day | ORAL | Status: DC
  Administered 2023-01-21 – 2023-01-23 (×2): 20 mg via ORAL
  Filled 2023-01-21 (×2): qty 2

## 2023-01-21 MED ORDER — MORPHINE SULFATE (PF) 4 MG/ML IV SOLN
4.0000 mg | INTRAVENOUS | Status: DC | PRN
  Administered 2023-01-22: 4 mg via INTRAVENOUS
  Filled 2023-01-21: qty 1

## 2023-01-21 MED ORDER — SIMVASTATIN 10 MG PO TABS
40.0000 mg | ORAL_TABLET | Freq: Every day | ORAL | Status: DC
  Administered 2023-01-21 – 2023-01-22 (×2): 40 mg via ORAL
  Filled 2023-01-21 (×3): qty 4

## 2023-01-21 MED ORDER — DOCUSATE SODIUM 100 MG PO CAPS: 100.0000 mg | ORAL_CAPSULE | Freq: Every day | ORAL | Status: DC | PRN

## 2023-01-21 MED ORDER — ACETAMINOPHEN 500 MG PO TABS: 1000.0000 mg | ORAL_TABLET | Freq: Four times a day (QID) | ORAL | Status: DC | PRN

## 2023-01-21 MED ORDER — ONDANSETRON HCL 4 MG/2ML IJ SOLN: 4.0000 mg | Freq: Four times a day (QID) | INTRAMUSCULAR | Status: DC | PRN

## 2023-01-21 MED ORDER — IOHEXOL 300 MG/ML  SOLN
100.0000 mL | Freq: Once | INTRAMUSCULAR | Status: AC | PRN
  Administered 2023-01-21: 100 mL via INTRAVENOUS

## 2023-01-21 MED ORDER — POTASSIUM CHLORIDE CRYS ER 20 MEQ PO TBCR
40.0000 meq | EXTENDED_RELEASE_TABLET | ORAL | Status: AC
Start: 2023-01-21 — End: 2023-01-21
  Administered 2023-01-21 (×2): 40 meq via ORAL
  Filled 2023-01-21 (×2): qty 2

## 2023-01-21 MED ORDER — OXYCODONE HCL 5 MG PO TABS
5.0000 mg | ORAL_TABLET | Freq: Four times a day (QID) | ORAL | Status: DC | PRN
  Administered 2023-01-21: 5 mg via ORAL
  Filled 2023-01-21: qty 1

## 2023-01-21 NOTE — H&P (Signed)
History and Physical    Michele Sanders VHQ:[id number] DOB: [birth year] DOA: 01/21/2023  PCP: Patrice Paradise, MD (Confirm with patient/family/NH records and if not entered, this has to be entered at [facility] point of entry) Patient coming from: Home  I have personally briefly reviewed patient's old medical records in [facility]  Chief Complaint: Right hip pain  HPI: Michele Sanders is a 67 y.o. female with medical history significant of recent Hartman's procedure after a perforated ductulitis, with a colostomy, HTN, GERD, presented with sudden onset of right hip pain.  Symptoms started suddenly, " I had a great day yesterday" but this morning patient woke up with 10/10 severe right hip pain, unable to bear weight at all for the excruciating pain.  Denied any fever chills.  Patient also reported that since she had the abdomen procedure and colostomy she has had a nonhealing wound from appears to be a dehiscence surgical wound above the location of colostomy bag.  She reported constant discharge from the wound with light green-yellowish thin discharge and she has to change dressing once a day and there is no significant decrease of discharge but she denied any pain, no fever or chills.  She reported close family output has been normal.  ED Course: Afebrile, none tachycardia nonhypotensive.  O2 saturation 97% on room air.  CT abdomen pelvis showed no acute findings MRI of right hip showed signs of septic arthritis.  Blood work showed WBC 16, hemoglobin 12, K3.2, creatinine 0.6.  Review of Systems: As per HPI otherwise 14 point review of systems negative.    Past Medical History:  Diagnosis Date   Arthritis    GERD (gastroesophageal reflux disease)    History of kidney stones    Hyperparathyroidism (HCC)    Hypertension    Pneumonia    PONV (postoperative nausea and vomiting)    Pre-diabetes     Past Surgical History:  Procedure Laterality Date   APPENDECTOMY     BACK SURGERY      L4-L5   BREAST BIOPSY Left 04/09/2019   neg   BREAST CYST ASPIRATION Left 04/08/2019   neg   COLECTOMY WITH COLOSTOMY CREATION/HARTMANN PROCEDURE N/A 12/31/2022   Procedure: LAPAROTOMY WITH COLOSTOMY CREATION/HARTMANN PROCEDURE;  Surgeon: Leafy Ro, MD;  Location: [facility] ORS;  Service: General;  Laterality: N/A;   COLONOSCOPY W/ POLYPECTOMY     CYSTOSCOPY/URETEROSCOPY/HOLMIUM LASER/STENT PLACEMENT Left 09/29/2020   Procedure: CYSTOSCOPY/URETEROSCOPY, /STENT PLACEMENT;  Surgeon: Riki Altes, MD;  Location: [facility] ORS;  Service: Urology;  Laterality: Left;   CYSTOSCOPY/URETEROSCOPY/HOLMIUM LASER/STENT PLACEMENT Left 11/10/2020   Procedure: CYSTOSCOPY/URETEROSCOPY/HOLMIUM LASER/STENT PLACEMENT;  Surgeon: Riki Altes, MD;  Location: [facility] ORS;  Service: Urology;  Laterality: Left;   FRACTURE SURGERY Right    humerus 2005   IR URETERAL STENT LEFT NEW ACCESS W/O SEP NEPHROSTOMY CATH  01/31/2022     reports that she has quit smoking. Her smoking use included cigarettes. She has a 15 pack-year smoking history. She has been exposed to tobacco smoke. She has never used smokeless tobacco. She reports current alcohol use. She reports that she does not use drugs.  No Known Allergies  Family History  Problem Relation Age of Onset   Breast cancer Neg Hx      Prior to Admission medications   Medication Sig Start Date End Date Taking? Authorizing Provider  acetaminophen (TYLENOL) 500 MG tablet Take 1,000 mg by mouth every 6 (six) hours as needed.    [provider]  aspirin  81 MG chewable tablet Chew 81 mg by mouth daily.    [provider]  cholecalciferol (VITAMIN D3) 25 MCG (1000 UNIT) tablet Take 1,000 Units by mouth daily.    [provider]  cyanocobalamin (VITAMIN B12) 1000 MCG tablet Take 1,000 mcg by mouth daily.    [provider]  docusate sodium (COLACE) 100 MG capsule Take 1 capsule (100 mg total) by mouth daily as needed. 01/31/22 01/31/23   Vanna Scotland, MD  ibuprofen (ADVIL) 200 MG tablet Take 200 mg by mouth every 6 (six) hours as needed. 2 tablets    [provider]  lisinopril (ZESTRIL) 20 MG tablet Take 20 mg by mouth daily. 12/12/22   [provider]  ondansetron (ZOFRAN-ODT) 4 MG disintegrating tablet Take 1 tablet (4 mg total) by mouth every 6 (six) hours as needed for nausea. 01/06/23   Donovan Kail, PA-C  pantoprazole (PROTONIX) 40 MG tablet Take 40 mg by mouth daily.    [provider]  simvastatin (ZOCOR) 40 MG tablet Take 40 mg by mouth daily at 6 PM.    [provider]  traMADol (ULTRAM) 50 MG tablet Take 1 tablet (50 mg total) by mouth every 6 (six) hours as needed for moderate pain (pain score 4-6) or severe pain (pain score 7-10) (mild pain). 01/06/23   Donovan Kail, PA-C    Physical Exam: Vitals:   01/21/23 1032 01/21/23 1252 01/21/23 1445 01/21/23 1657  BP: 129/85 (!) 160/80  (!) 148/79  Pulse: 95 72  75  Resp: 18 18  20   Temp: 98.6 F (37 C)  98.4 F (36.9 C)   TempSrc: Oral  Oral   SpO2: 95% 98%  97%  Weight: 80.7 kg     Height: 5\' 4"  (1.626 m)       Constitutional: NAD, calm, comfortable Vitals:   01/21/23 1032 01/21/23 1252 01/21/23 1445 01/21/23 1657  BP: 129/85 (!) 160/80  (!) 148/79  Pulse: 95 72  75  Resp: 18 18  20   Temp: 98.6 F (37 C)  98.4 F (36.9 C)   TempSrc: Oral  Oral   SpO2: 95% 98%  97%  Weight: 80.7 kg     Height: 5\' 4"  (1.626 m)      Eyes: PERRL, lids and conjunctivae normal ENMT: Mucous membranes are moist. Posterior pharynx clear of any exudate or lesions.Normal dentition.  Neck: normal, supple, no masses, no thyromegaly Respiratory: clear to auscultation bilaterally, no wheezing, no crackles. Normal respiratory effort. No accessory muscle use.  Cardiovascular: Regular rate and rhythm, no murmurs / rubs / gallops. No extremity edema. 2+ pedal pulses. No carotid bruits.  Abdomen: no tenderness, no masses palpated. No  hepatosplenomegaly. Bowel sounds positive.  Musculoskeletal: Severe tenderness and decreased ROM of right hip Skin: Dehiscent wound on lower abdomen above the colostomy site, with thin yellow-greenish discharge and the border of the wound appears to be erythematous and swelling.  Colostomy appeared to be intact stoma, with soft stool in bag. Neurologic: CN 2-12 grossly intact. Sensation intact, DTR normal. Strength 5/5 in all 4.  Psychiatric: Normal judgment and insight. Alert and oriented x 3. Normal mood.     Labs on Admission: I have personally reviewed following labs and imaging studies  CBC: Recent Labs  Lab 01/21/23 1112  WBC 16.3*  NEUTROABS 12.9*  HGB 12.1  HCT 36.2  MCV 87.0  PLT 305   Basic Metabolic Panel: Recent Labs  Lab 01/21/23 1112  NA 138  K 3.2*  CL 103  CO2 24  GLUCOSE 122*  BUN 13  CREATININE 0.66  CALCIUM 7.6*   GFR: Estimated Creatinine Clearance: 68.2 mL/min (by C-G formula based on SCr of 0.66 mg/dL). Liver Function Tests: Recent Labs  Lab 01/21/23 1112  AST 26  ALT 38  ALKPHOS 92  BILITOT 0.7  PROT 6.8  ALBUMIN 3.7   Recent Labs  Lab 01/21/23 1112  LIPASE 27   No results for input(s): "AMMONIA" in the last 168 hours. Coagulation Profile: No results for input(s): "INR", "PROTIME" in the last 168 hours. Cardiac Enzymes: No results for input(s): "CKTOTAL", "CKMB", "CKMBINDEX", "TROPONINI" in the last 168 hours. BNP (last 3 results) No results for input(s): "PROBNP" in the last 8760 hours. HbA1C: No results for input(s): "HGBA1C" in the last 72 hours. CBG: No results for input(s): "GLUCAP" in the last 168 hours. Lipid Profile: No results for input(s): "CHOL", "HDL", "LDLCALC", "TRIG", "CHOLHDL", "LDLDIRECT" in the last 72 hours. Thyroid Function Tests: No results for input(s): "TSH", "T4TOTAL", "FREET4", "T3FREE", "THYROIDAB" in the last 72 hours. Anemia Panel: No results for input(s): "VITAMINB12", "FOLATE", "FERRITIN", "TIBC",  "IRON", "RETICCTPCT" in the last 72 hours. Urine analysis:    Component Value Date/Time   COLORURINE YELLOW (A) 01/21/2023 1112   APPEARANCEUR CLEAR (A) 01/21/2023 1112   APPEARANCEUR Hazy (A) 12/21/2021 1004   LABSPEC 1.042 (H) 01/21/2023 1112   PHURINE 8.0 01/21/2023 1112   GLUCOSEU NEGATIVE 01/21/2023 1112   HGBUR SMALL (A) 01/21/2023 1112   BILIRUBINUR NEGATIVE 01/21/2023 1112   BILIRUBINUR Negative 12/21/2021 1004   KETONESUR NEGATIVE 01/21/2023 1112   PROTEINUR NEGATIVE 01/21/2023 1112   NITRITE NEGATIVE 01/21/2023 1112   LEUKOCYTESUR NEGATIVE 01/21/2023 1112    Radiological Exams on Admission: MR HIP RIGHT WO CONTRAST  Result Date: 01/21/2023 CLINICAL DATA:  Septic arthritis suspected, hip, xray done recent abdominal surgery, now with severe right hip pain, uptrending WBC, no fever EXAM: MR OF THE RIGHT HIP WITHOUT CONTRAST TECHNIQUE: Multiplanar, multisequence MR imaging was performed. No intravenous contrast was administered. COMPARISON:  X-ray 01/21/2023, CT 01/21/2023 FINDINGS: Bones: No acute fracture. No dislocation. No femoral head avascular necrosis. Bony pelvis intact without diastasis. SI joints and pubic symphysis within normal limits. No SI joint effusion. No erosions. No bone marrow edema. No marrow replacing bone lesion. Articular cartilage and labrum Articular cartilage: Mild chondral thinning along the superior aspect of the right hip joint. No subchondral marrow edema. Labrum: Degeneration of the anterosuperior labrum. No paralabral cyst. Joint or bursal effusion Joint effusion:  Small-moderate-sized right hip joint effusion. Bursae: Mild right peritrochanteric bursal edema. Muscles and tendons Muscles and tendons: Mild tendinosis of the right gluteus medius and minimus tendons. The hamstring, iliopsoas, rectus femoris, and adductor tendons appear intact without tear or significant tendinosis. Normal muscle bulk and signal intensity without edema, atrophy, or fatty  infiltration. Other findings Miscellaneous: No soft tissue edema or fluid collection. No inguinal lymphadenopathy. IMPRESSION: 1. Small-moderate-sized right hip joint effusion. In the setting of infection, septic arthritis is not excluded and arthrocentesis should be performed. 2. Mild right hip osteoarthritis. 3. Mild tendinosis of the right gluteus medius and minimus tendons. 4. Mild right peritrochanteric bursitis. Electronically Signed   By: Duanne Guess D.O.   On: 01/21/2023 16:22   CT ABDOMEN PELVIS W CONTRAST  Result Date: 01/21/2023 CLINICAL DATA:  Severe right lower quadrant and groin pain. Several weeks postop from sigmoid colectomy and descending colostomy for perforated diverticulitis. EXAM: CT ABDOMEN AND PELVIS WITH  CONTRAST TECHNIQUE: Multidetector CT imaging of the abdomen and pelvis was performed using the standard protocol following bolus administration of intravenous contrast. RADIATION DOSE REDUCTION: This exam was performed according to the departmental dose-optimization program which includes automated exposure control, adjustment of the mA and/or kV according to patient size and/or use of iterative reconstruction technique. CONTRAST:  OMNIPAQUE IOHEXOL 300 MG/ML  SOLN COMPARISON:  01/09/2023 FINDINGS: Lower Chest: No acute findings. Hepatobiliary: No suspicious hepatic masses identified. Stable tiny sub-cm hepatic cyst. Mild diffuse hepatic steatosis. Gallbladder is unremarkable. No evidence of biliary ductal dilatation. Pancreas:  No mass or inflammatory changes. Spleen: Within normal limits in size and appearance. Adrenals/Urinary Tract: No suspicious masses identified. Renal calculi again seen in lower pole of left kidney, largest measuring 16 mm. No evidence of ureteral calculi or hydronephrosis. Unremarkable unopacified urinary bladder. Stomach/Bowel: Postop changes again seen from sigmoid colon resection with left lower quadrant colostomy and Hartman's pouch. No evidence  of obstruction, inflammatory process or abnormal fluid collections. Mild colonic diverticulosis noted, however, there are no signs of diverticulitis. Stable small hiatal hernia. Vascular/Lymphatic: No pathologically enlarged lymph nodes. No acute vascular findings. Reproductive: Stable 3 cm calcified uterine fibroid. Adnexal regions are unremarkable. Other:  None. Musculoskeletal:  No suspicious bone lesions identified. IMPRESSION: No acute findings. Mild colonic diverticulosis, without radiographic evidence of diverticulitis. Stable left renal calculi. No evidence of ureteral calculi or hydronephrosis. Stable small hiatal hernia. Stable 3 cm calcified uterine fibroid. Mild hepatic steatosis. Electronically Signed   By: Danae Orleans M.D.   On: 01/21/2023 12:44   DG Hip Unilat With Pelvis 2-3 Views Right  Result Date: 01/21/2023 CLINICAL DATA:  Hip pain without injury. Right groin pain this started this morning and radiates to the buttocks. EXAM: DG HIP (WITH OR WITHOUT PELVIS) 3V RIGHT COMPARISON:  None Available. FINDINGS: Generalized osteopenia. No notable degenerative spurring or hip joint narrowing. Coarse calcification in the right paramedian pelvis measuring 3.7 cm, likely uterine fibroid. IMPRESSION: No acute finding. Electronically Signed   By: Tiburcio Pea M.D.   On: 01/21/2023 12:21    EKG: None  Assessment/Plan Principal Problem:   Septic arthritis of hip (HCC) Active Problems:   Septic arthritis (HCC)  (please populate well all problems here in Problem List. (For example, if patient is on BP meds at home and you resume or decide to hold them, it is a problem that needs to be her. Same for CAD, COPD, HLD and so on)  Severe right hip pain, rule out septic arthritis -Case was discussed between IR and orthopedic surgery Dr. Allena Katz, orthopedic surgery recommend IR aspiration culture and hold off antibiotics until aspiration is done.  Explained to the patient and her daughter over the phone,  all parties agreed with the plan. -Pain control  Abdominal wall ulcer -Subacute, question about whether this is infected.  The wound does have some worrying signs of infection and I discussed the case with on-call surgeon Dr. Maia Plan, who will see the patient to determine whether the wound need to be debrided. -Hold off ABX as mentioned above  Hypokalemia -P.o. replacement  HTN -Stable, continue home BP meds  Recent diverticulitis bowel perforation status post Hartman's procedure -Today's CT abdominal pelvis showed no acute concerns for recurrent diverticulitis. -Abdominal wall ulcer management as above.  DVT prophylaxis: Lovenox Code Status: Full code Family Communication: Daughter over the phone Disposition Plan: Patient is sick with likely a right hip septic arthritis requiring IV antibiotics and intervention by IR, expect more  than 2 midnight hospital stay Consults called: Orthopedic surgery, IR, general surgery Admission status: MedSurg admission  Emeline General MD [facility] Pager [phone]  01/21/2023, 5:44 PM

## 2023-01-21 NOTE — ED Provider Notes (Signed)
[facility] Provider Note    Event Date/Time   First MD Initiated Contact with Patient 01/21/23 1047     (approximate)   History   Groin Pain (R)   HPI  Michele Sanders is a 67 y.o. female with a past medical history of perforated diverticulitis status post Hartman's procedure and colostomy, complicated by postsurgical ileus who presents today for evaluation of right groin pain.  Patient reports that she first noticed this this morning.  She reports that she has pain with any movement of her right leg.  She reports that she is unable to bear weight due to the pain.  She denies any falls or trauma.  She denies knee pain or foot pain.  She denies any radicular symptoms.  No urinary fecal incontinence or retention.  No saddle anesthesia.  She has not had any nausea or vomiting though she is holding an emesis basin.  She has not had any fevers or chills.  She has had normal ostomy output.  No blood in her stool.  Patient Active Problem List   Diagnosis Date Noted   Wound dehiscence, surgical 01/17/2023   Colostomy complication, unspecified (HCC) 01/17/2023   SBO (small bowel obstruction) (HCC) 01/09/2023   Perforated diverticulum 12/31/2022   Diverticulitis large intestine 12/29/2022   Cigarette smoker one half pack a day or less 10/17/2017   Essential hypertension 10/17/2017   Osteopenia of multiple sites 10/17/2017          Physical Exam   Triage Vital Signs: ED Triage Vitals [01/21/23 1032]  Encounter Vitals Group     BP 129/85     Systolic BP Percentile      Diastolic BP Percentile      Pulse Rate 95     Resp 18     Temp 98.6 F (37 C)     Temp Source Oral     SpO2 95 %     Weight 178 lb (80.7 kg)     Height 5\' 4"  (1.626 m)     Head Circumference      Peak Flow      Pain Score 10     Pain Loc      Pain Education      Exclude from Growth Chart     Most recent vital signs: Vitals:   01/21/23 1252 01/21/23 1445  BP: (!) 160/80   Pulse:  72   Resp: 18   Temp:  98.4 F (36.9 C)  SpO2: 98%     Physical Exam Vitals and nursing note reviewed.  Constitutional:      General: Awake and alert. No acute distress.    Appearance: Normal appearance. The patient is normal weight.  HENT:     Head: Normocephalic and atraumatic.     Mouth: Mucous membranes are moist.  Eyes:     General: PERRL. Normal EOMs        Right eye: No discharge.        Left eye: No discharge.     Conjunctiva/sclera: Conjunctivae normal.  Cardiovascular:     Rate and Rhythm: Normal rate and regular rhythm.     Pulses: Normal pulses.  Pulmonary:     Effort: Pulmonary effort is normal. No respiratory distress.     Breath sounds: Normal breath sounds.  Abdominal:     Abdomen is soft. There is mild right lower quadrant abdominal tenderness. No rebound or guarding. No distention. Ostomy bag in place with brown stool, nonbloody Musculoskeletal:  General: No swelling. Normal range of motion.     Cervical back: Normal range of motion and neck supple.  Positive logroll on the right side.  Pain with any attempted active or passive range of motion of right hip.  No warmth or erythema to right hip.  No warmth or erythema to knee.  No pain with axial loading of right hip. Back: No midline vertebral tenderness tenderness. Normal great toe extension against resistance bilaterally. Normal sensation throughout feet. Normal patellar reflexes.  Pain in right hip with attempted straight leg raise. Skin:    General: Skin is warm and dry.     Capillary Refill: Capillary refill takes less than 2 seconds.     Findings: No rash.  Neurological:     Mental Status: The patient is awake and alert.      ED Results / Procedures / Treatments   Labs (all labs ordered are listed, but only abnormal results are displayed) Labs Reviewed  CBC WITH DIFFERENTIAL/PLATELET - Abnormal; Notable for the following components:      Result Value   WBC 16.3 (*)    Neutro Abs 12.9 (*)     Monocytes Absolute 1.4 (*)    All other components within normal limits  COMPREHENSIVE METABOLIC PANEL - Abnormal; Notable for the following components:   Potassium 3.2 (*)    Glucose, Bld 122 (*)    Calcium 7.6 (*)    All other components within normal limits  LIPASE, BLOOD  URINALYSIS, ROUTINE W REFLEX MICROSCOPIC     EKG     RADIOLOGY I independently reviewed and interpreted imaging and agree with radiologists findings.     PROCEDURES:  Critical Care performed:   Procedures   MEDICATIONS ORDERED IN ED: Medications  morphine (PF) 4 MG/ML injection 4 mg (4 mg Intravenous Given 01/21/23 1126)  iohexol (OMNIPAQUE) 300 MG/ML solution 100 mL (100 mLs Intravenous Contrast Given 01/21/23 1154)     IMPRESSION / MDM / ASSESSMENT AND PLAN / ED COURSE  I reviewed the triage vital signs and the nursing notes.   Differential diagnosis includes, but is not limited to, postsurgical complication, seroma, retroperitoneal hematoma, sciatica, hip effusion.  I reviewed the patient's chart.  Patient was seen on 12/29/2022 and diagnosed with a perforated diverticulitis.  On hospital day 2 she required surgery and had an uneventful Hartman's procedure by Dr. Elenor Legato on 12/31/2022.  Postoperatively she developed a postoperative ileus and an NG tube was attempted but the patient declined.  She was discharged in 01/06/2023, though she returned on 01/08/2023 with question SBO but potentially ileus.  Patient is awake and alert, hemodynamically stable and afebrile.  Workup is negative.  Labs reveal a leukocytosis to 16 though.  X-ray of her hip reveals no acute findings.  CT of her abdomen and pelvis reveals no abnormal fluid collections, no acute findings.  Upon reevaluation, patient continues to have significant discomfort with any movement of her right hip.  She has absolutely no vertebral tenderness.  She has normal strength and sensation throughout her feet and legs, normal great toe  extension bilaterally, no urinary/fecal incontinence or retention or saddle anesthesia to suggest cord compression or epidural abscess.  She has no erythema or warmth to right hip, though she has pain with any attempted ROM of hip. She will not actively range hip due to pain. Pain with passive ROM including logroll and flexion of hip. No pain with axial loading. However, given her uptrending WBC and her pain with any movement,  will obtain MRI hip for further evaluation.  Patient was discussed with and subsequently passed off to Dr. Roxan Hockey pending MRI results and final disposition.  Patient's presentation is most consistent with acute complicated illness / injury requiring diagnostic workup.      FINAL CLINICAL IMPRESSION(S) / ED DIAGNOSES   Final diagnoses:  Right hip pain     Rx / DC Orders   ED Discharge Orders     None        Note:  This document was prepared using Dragon voice recognition software and may include unintentional dictation errors.   Keturah Shavers 01/21/23 1511    Willy Eddy, MD 01/21/23 1536    Willy Eddy, MD 01/21/23 [phone]

## 2023-01-21 NOTE — ED Triage Notes (Signed)
First nurse note: pt to ED [facility] from home for right groin pain started this am. Recent abd surgery with colostomy bag placed for bowel blockage. 1000mg  tylenol PTA.

## 2023-01-21 NOTE — Progress Notes (Signed)
Called by ED staff regarding patient's R hip pain. Appears to have nontraumatic etiology and pain with any motion of the R hip. Had recent perforated diverticulitis and hartmann procedure on 12/31/22.   MRI R hip shows joint effusion. This, along with clinical picture, is concerning for septic arthritis of the R hip.   Recommend : IR guided aspiration of R hip joint with sample sent for STAT cell count, crystals, and gram stain/culture.  Hold antibiotics unless patient shows clinical signs of sepsis.  Keep NPO after midnight.  Surgical recommendation is pending R hip joint aspiration and would recommend obtaining aspirate sample as soon as possible.  Admission to Hospitalist service.  Obtain ESR & CRP Formal consult to follow.

## 2023-01-22 ENCOUNTER — Encounter: Payer: Self-pay | Admitting: Registered Nurse

## 2023-01-22 ENCOUNTER — Inpatient Hospital Stay: Payer: Medicare HMO

## 2023-01-22 ENCOUNTER — Encounter
Admission: EM | Disposition: A | Discharge status: Home / Self Care | Payer: Self-pay | Source: Home / Self Care | Attending: Internal Medicine

## 2023-01-22 DIAGNOSIS — M11251 Other chondrocalcinosis, right hip: Secondary | ICD-10-CM | POA: Insufficient documentation

## 2023-01-22 DIAGNOSIS — I10 Essential (primary) hypertension: Secondary | ICD-10-CM

## 2023-01-22 DIAGNOSIS — M009 Pyogenic arthritis, unspecified: Secondary | ICD-10-CM | POA: Diagnosis not present

## 2023-01-22 DIAGNOSIS — T8131XS Disruption of external operation (surgical) wound, not elsewhere classified, sequela: Secondary | ICD-10-CM

## 2023-01-22 LAB — SYNOVIAL CELL COUNT + DIFF, W/ CRYSTALS
Eosinophils-Synovial: 0 %
Lymphocytes-Synovial Fld: 1 %
Monocyte-Macrophage-Synovial Fluid: 13 %
Neutrophil, Synovial: 86 %
WBC, Synovial: 7476 /mm3 — ABNORMAL HIGH (ref 0–200)

## 2023-01-22 LAB — BASIC METABOLIC PANEL
Anion gap: 12 (ref 5–15)
BUN: 12 mg/dL (ref 8–23)
CO2: 23 mmol/L (ref 22–32)
Calcium: 7.2 mg/dL — ABNORMAL LOW (ref 8.9–10.3)
Chloride: 104 mmol/L (ref 98–111)
Creatinine, Ser: 0.67 mg/dL (ref 0.44–1.00)
GFR, Estimated: >60 mL/min (ref >60)
Glucose, Bld: 97 mg/dL (ref 70–99)
Potassium: 3.8 mmol/L (ref 3.5–5.1)
Sodium: 139 mmol/L (ref 135–145)

## 2023-01-22 LAB — CBC
HCT: 35.3 % — ABNORMAL LOW (ref 36.0–46.0)
Hemoglobin: 11.5 g/dL — ABNORMAL LOW (ref 12.0–15.0)
MCH: 29.3 pg (ref 26.0–34.0)
MCHC: 32.6 g/dL (ref 30.0–36.0)
MCV: 89.8 fL (ref 80.0–100.0)
Platelets: 274 10*3/uL (ref 150–400)
RBC: 3.93 MIL/uL (ref 3.87–5.11)
RDW: 13.2 % (ref 11.5–15.5)
WBC: 10.3 10*3/uL (ref 4.0–10.5)
nRBC: 0 % (ref 0.0–0.2)

## 2023-01-22 LAB — MAGNESIUM: Magnesium: 1 mg/dL — ABNORMAL LOW (ref 1.7–2.4)

## 2023-01-22 SURGERY — IRRIGATION AND DEBRIDEMENT HIP
Anesthesia: Choice | Site: Hip | Laterality: Right

## 2023-01-22 MED ORDER — IBUPROFEN 100 MG/5ML PO SUSP
800.0000 mg | Freq: Three times a day (TID) | ORAL | Status: DC
  Administered 2023-01-22 (×2): 800 mg via ORAL
  Filled 2023-01-22 (×4): qty 40

## 2023-01-22 MED ORDER — LIDOCAINE HCL (PF) 1 % IJ SOLN
20.0000 mL | Freq: Once | INTRAMUSCULAR | Status: AC
  Administered 2023-01-22: 20 mL
  Filled 2023-01-22: qty 20

## 2023-01-22 MED ORDER — COLCHICINE 0.6 MG PO TABS
0.6000 mg | ORAL_TABLET | Freq: Two times a day (BID) | ORAL | Status: DC
  Administered 2023-01-22 – 2023-01-23 (×3): 0.6 mg via ORAL
  Filled 2023-01-22 (×3): qty 1

## 2023-01-22 MED ORDER — IBUPROFEN 100 MG/5ML PO SUSP
400.0000 mg | Freq: Three times a day (TID) | ORAL | Status: DC
  Filled 2023-01-22 (×2): qty 20

## 2023-01-22 MED ORDER — PIPERACILLIN-TAZOBACTAM 4.5 G IVPB: 4.5000 g | Freq: Three times a day (TID) | INTRAVENOUS | Status: DC

## 2023-01-22 MED ORDER — MAGNESIUM SULFATE 4 GM/100ML IV SOLN
4.0000 g | Freq: Once | INTRAVENOUS | Status: AC
  Administered 2023-01-22: 4 g via INTRAVENOUS
  Filled 2023-01-22: qty 100

## 2023-01-22 SURGICAL SUPPLY — 49 items
BLADE SAW SGTL 13X75X1.27 (BLADE) ×1 IMPLANT
BNDG COHESIVE 4X5 TAN STRL LF (GAUZE/BANDAGES/DRESSINGS) ×1 IMPLANT
BOWL CEMENT MIXING ADV NOZZLE (MISCELLANEOUS) ×1 IMPLANT
CHLORAPREP W/TINT 26 (MISCELLANEOUS) ×2 IMPLANT
COVER MAYO STAND STRL (DRAPES) ×1 IMPLANT
DERMABOND ADVANCED .7 DNX12 (GAUZE/BANDAGES/DRESSINGS) ×1 IMPLANT
DRAPE INCISE IOBAN 66X60 STRL (DRAPES) ×1 IMPLANT
DRAPE SHEET LG 3/4 BI-LAMINATE (DRAPES) ×1 IMPLANT
DRAPE SURG ORHT 6 SPLT 77X108 (DRAPES) ×1 IMPLANT
DRAPE TABLE BACK 80X90 (DRAPES) ×1 IMPLANT
DRAPE U-SHAPE 47X51 STRL (DRAPES) ×1 IMPLANT
DRSG OPSITE POSTOP 4X10 (GAUZE/BANDAGES/DRESSINGS) IMPLANT
DRSG OPSITE POSTOP 4X8 (GAUZE/BANDAGES/DRESSINGS) IMPLANT
ELECT REM PT RETURN 9FT ADLT (ELECTROSURGICAL) ×1
ELECTRODE REM PT RTRN 9FT ADLT (ELECTROSURGICAL) ×1 IMPLANT
GAUZE XEROFORM 1X8 LF (GAUZE/BANDAGES/DRESSINGS) ×1 IMPLANT
GLOVE BIOGEL PI IND STRL 8 (GLOVE) ×2 IMPLANT
GLOVE SKINSENSE STRL SZ8.0 LF (GLOVE) ×1 IMPLANT
GLOVE SURG ORTHO 8.0 STRL STRW (GLOVE) ×2 IMPLANT
GOWN STRL REUS W/ TWL LRG LVL3 (GOWN DISPOSABLE) ×1 IMPLANT
GOWN STRL REUS W/ TWL XL LVL3 (GOWN DISPOSABLE) ×1 IMPLANT
GOWN STRL REUS W/TWL LRG LVL3 (GOWN DISPOSABLE) ×1
GOWN STRL REUS W/TWL XL LVL3 (GOWN DISPOSABLE) ×1
HEMOVAC 400ML (MISCELLANEOUS)
HOOD PEEL AWAY T7 (MISCELLANEOUS) ×2 IMPLANT
IMMBOLIZER KNEE 19 BLUE UNIV (SOFTGOODS) IMPLANT
IV NS IRRIG 3000ML ARTHROMATIC (IV SOLUTION) ×1 IMPLANT
KIT DRAIN HEMOVAC JP 7FR 400ML (MISCELLANEOUS) IMPLANT
KIT TURNOVER KIT A (KITS) ×1 IMPLANT
MANIFOLD NEPTUNE II (INSTRUMENTS) ×2 IMPLANT
NDL MAYO CATGUT SZ1 (NEEDLE) ×1
NDL SAFETY ECLIPSE 18X1.5 (NEEDLE) ×1 IMPLANT
NEEDLE HYPO 22X1.5 SAFETY MO (MISCELLANEOUS) ×1 IMPLANT
NEEDLE MAYO CATGUT SZ1 (NEEDLE) ×1 IMPLANT
NS IRRIG 500ML POUR BTL (IV SOLUTION) ×1 IMPLANT
PACK HIP PROSTHESIS (MISCELLANEOUS) ×1 IMPLANT
PENCIL SMOKE EVACUATOR (MISCELLANEOUS) ×1 IMPLANT
PULSAVAC PLUS IRRIG FAN TIP (DISPOSABLE) ×1
STAPLER SKIN PROX 35W (STAPLE) ×1 IMPLANT
SUT ETHIBOND #5 BRAIDED 30INL (SUTURE) ×1 IMPLANT
SUT VIC AB 0 CT1 36 (SUTURE) ×1 IMPLANT
SUT VIC AB 2-0 CT2 27 (SUTURE) ×2 IMPLANT
SYR 20ML LL LF (SYRINGE) ×2 IMPLANT
TAPE MICROFOAM 4IN (TAPE) ×1 IMPLANT
TIP FAN IRRIG PULSAVAC PLUS (DISPOSABLE) ×1 IMPLANT
TRAP FLUID SMOKE EVACUATOR (MISCELLANEOUS) ×1 IMPLANT
TUBE KAMVAC SUCTION (TUBING) ×1 IMPLANT
TUBE SUCT KAM VAC (TUBING) ×1 IMPLANT
WATER STERILE IRR 1000ML POUR (IV SOLUTION) ×1 IMPLANT

## 2023-01-22 NOTE — Plan of Care (Signed)
  Problem: Education: Goal: Knowledge of General Education information will improve Description: Including pain rating scale, medication(s)/side effects and non-pharmacologic comfort measures Outcome: Progressing   Problem: Activity: Goal: Risk for activity intolerance will decrease Outcome: Progressing   Problem: Nutrition: Goal: Adequate nutrition will be maintained Outcome: Progressing   Problem: Coping: Goal: Level of anxiety will decrease Outcome: Progressing   Problem: Pain Management: Goal: General experience of comfort will improve Outcome: Progressing

## 2023-01-22 NOTE — Consult Note (Signed)
ORTHOPAEDIC CONSULTATION  REQUESTING PHYSICIAN: Marrion Coy, MD  Chief Complaint:   R hip pain  History of Present Illness: Michele Sanders is a 67 y.o. female who had a history of Hartmann procedure on 12/31/2022 for perforated diverticulitis. She lives at home and normally ambulates unassisted.  She states she had been doing well at home until yesterday morning when she just woke up with severe right hip pain.  Pain is located in the groin region.  She states that she had difficulty bearing weight and could not move her hip.  There was no traumatic incident.  No other fevers or chills.  In the emergency department, MRI of the right hip showed a hip joint effusion.  She was admitted and underwent IR guided aspiration of the right hip this morning for further evaluation given concern for septic arthritis of the right hip.  She states that her pain has improved since the hip joint was aspirated.  She also denies any personal or family history of gout or pseudogout.  Past Medical History:  Diagnosis Date   Arthritis    GERD (gastroesophageal reflux disease)    History of kidney stones    Hyperparathyroidism (HCC)    Hypertension    Pneumonia    PONV (postoperative nausea and vomiting)    Pre-diabetes    Past Surgical History:  Procedure Laterality Date   APPENDECTOMY     BACK SURGERY     L4-L5   BREAST BIOPSY Left 04/09/2019   neg   BREAST CYST ASPIRATION Left 04/08/2019   neg   COLECTOMY WITH COLOSTOMY CREATION/HARTMANN PROCEDURE N/A 12/31/2022   Procedure: LAPAROTOMY WITH COLOSTOMY CREATION/HARTMANN PROCEDURE;  Surgeon: Leafy Ro, MD;  Location: [facility] ORS;  Service: General;  Laterality: N/A;   COLONOSCOPY W/ POLYPECTOMY     CYSTOSCOPY/URETEROSCOPY/HOLMIUM LASER/STENT PLACEMENT Left 09/29/2020   Procedure: CYSTOSCOPY/URETEROSCOPY, /STENT PLACEMENT;  Surgeon: Riki Altes, MD;  Location: [facility] ORS;  Service:  Urology;  Laterality: Left;   CYSTOSCOPY/URETEROSCOPY/HOLMIUM LASER/STENT PLACEMENT Left 11/10/2020   Procedure: CYSTOSCOPY/URETEROSCOPY/HOLMIUM LASER/STENT PLACEMENT;  Surgeon: Riki Altes, MD;  Location: [facility] ORS;  Service: Urology;  Laterality: Left;   FRACTURE SURGERY Right    humerus 2005   IR URETERAL STENT LEFT NEW ACCESS W/O SEP NEPHROSTOMY CATH  01/31/2022   Social History   Socioeconomic History   Marital status: Married    Spouse name: Earl Lites   Number of children: Not on file   Years of education: Not on file   Highest education level: Not on file  Occupational History   Not on file  Tobacco Use   Smoking status: Former    Current packs/day: 0.50    Average packs/day: 0.5 packs/day for 30.0 years (15.0 ttl pk-yrs)    Types: Cigarettes    Passive exposure: Past   Smokeless tobacco: Never  Vaping Use   Vaping status: Never Used  Substance and Sexual Activity   Alcohol use: Yes    Comment: wine nightly   Drug use: Never   Sexual activity: Not on file  Other Topics Concern   Not on file  Social History Narrative   Not on file   Social Determinants of Health   Financial Resource Strain: Low Risk  (12/12/2022)   Received from [facility]   Overall Financial Resource Strain (CARDIA)    Difficulty of Paying Living Expenses: Not hard at all  Food Insecurity: No Food Insecurity (01/21/2023)   Hunger Vital Sign    Worried About Running Out  of Food in the Last Year: Never true    Ran Out of Food in the Last Year: Never true  Transportation Needs: No Transportation Needs (01/09/2023)   PRAPARE - Administrator, Civil Service (Medical): No    Lack of Transportation (Non-Medical): No  Physical Activity: Not on file  Stress: Not on file  Social Connections: Not on file   Family History  Problem Relation Age of Onset   Breast cancer Neg Hx    No Known Allergies Prior to Admission medications   Medication Sig Start Date End Date  Taking? Authorizing Provider  acetaminophen (TYLENOL) 500 MG tablet Take 1,000 mg by mouth every 6 (six) hours as needed.   Yes [provider]  aspirin 81 MG chewable tablet Chew 81 mg by mouth daily.   Yes [provider]  cholecalciferol (VITAMIN D3) 25 MCG (1000 UNIT) tablet Take 1,000 Units by mouth daily.   Yes [provider]  cyanocobalamin (VITAMIN B12) 1000 MCG tablet Take 1,000 mcg by mouth daily.   Yes [provider]  docusate sodium (COLACE) 100 MG capsule Take 1 capsule (100 mg total) by mouth daily as needed. 01/31/22 01/31/23 Yes Vanna Scotland, MD  ibuprofen (ADVIL) 200 MG tablet Take 200 mg by mouth every 6 (six) hours as needed. 2 tablets   Yes [provider]  lisinopril (ZESTRIL) 20 MG tablet Take 20 mg by mouth daily. 12/12/22  Yes [provider]  ondansetron (ZOFRAN-ODT) 4 MG disintegrating tablet Take 1 tablet (4 mg total) by mouth every 6 (six) hours as needed for nausea. 01/06/23  Yes Donovan Kail, PA-C  pantoprazole (PROTONIX) 40 MG tablet Take 40 mg by mouth daily.   Yes [provider]  simvastatin (ZOCOR) 40 MG tablet Take 40 mg by mouth daily at 6 PM.   Yes [provider]  traMADol (ULTRAM) 50 MG tablet Take 1 tablet (50 mg total) by mouth every 6 (six) hours as needed for moderate pain (pain score 4-6) or severe pain (pain score 7-10) (mild pain). 01/06/23  Yes Donovan Kail, PA-C   Recent Labs    01/21/23 1112 01/22/23 0533 01/22/23 0753  WBC 16.3* 10.3  --   HGB 12.1 11.5*  --   HCT 36.2 35.3*  --   PLT 305 274  --   K 3.2*  --  3.8  CL 103  --  104  CO2 24  --  23  BUN 13  --  12  CREATININE 0.66  --  0.67  GLUCOSE 122*  --  97  CALCIUM 7.6*  --  7.2*   DG FLUORO GUIDED NEEDLE PLC ASPIRATION/INJECTION LOC  Result Date: 01/22/2023 CLINICAL DATA:  Right hip pain and right hip effusion. Need to rule out septic arthritis. EXAM: RIGHT HIP ASPIRATION WITH FLUOROSCOPY  FLUOROSCOPY TIME:  Radiation Exposure Index (as provided by the fluoroscopic device): 4.7 mGy Kerma PROCEDURE: The procedure was explained to the patient. The risks and benefits of the procedure were discussed and the patient's questions were addressed. Informed consent was obtained from the patient. Patient was placed supine. Right groin was prepped with chlorhexidine and sterile field was created. Skin was anesthetized using 1% lidocaine. Using fluoroscopic guidance, a 20 gauge spinal needle was directed onto the lateral aspect of the right femoral head/neck junction. 6 mL of slightly turbid yellow fluid was aspirated. Needle was removed. Bandage placed over the puncture site. Fluid was sent for analysis. IMPRESSION: Technically successful  right hip aspiration with fluoroscopy. Electronically Signed   By: Richarda Overlie M.D.   On: 01/22/2023 12:07   MR HIP RIGHT WO CONTRAST  Result Date: 01/21/2023 CLINICAL DATA:  Septic arthritis suspected, hip, xray done recent abdominal surgery, now with severe right hip pain, uptrending WBC, no fever EXAM: MR OF THE RIGHT HIP WITHOUT CONTRAST TECHNIQUE: Multiplanar, multisequence MR imaging was performed. No intravenous contrast was administered. COMPARISON:  X-ray 01/21/2023, CT 01/21/2023 FINDINGS: Bones: No acute fracture. No dislocation. No femoral head avascular necrosis. Bony pelvis intact without diastasis. SI joints and pubic symphysis within normal limits. No SI joint effusion. No erosions. No bone marrow edema. No marrow replacing bone lesion. Articular cartilage and labrum Articular cartilage: Mild chondral thinning along the superior aspect of the right hip joint. No subchondral marrow edema. Labrum: Degeneration of the anterosuperior labrum. No paralabral cyst. Joint or bursal effusion Joint effusion:  Small-moderate-sized right hip joint effusion. Bursae: Mild right peritrochanteric bursal edema. Muscles and tendons Muscles and tendons: Mild tendinosis of the  right gluteus medius and minimus tendons. The hamstring, iliopsoas, rectus femoris, and adductor tendons appear intact without tear or significant tendinosis. Normal muscle bulk and signal intensity without edema, atrophy, or fatty infiltration. Other findings Miscellaneous: No soft tissue edema or fluid collection. No inguinal lymphadenopathy. IMPRESSION: 1. Small-moderate-sized right hip joint effusion. In the setting of infection, septic arthritis is not excluded and arthrocentesis should be performed. 2. Mild right hip osteoarthritis. 3. Mild tendinosis of the right gluteus medius and minimus tendons. 4. Mild right peritrochanteric bursitis. Electronically Signed   By: Duanne Guess D.O.   On: 01/21/2023 16:22   CT ABDOMEN PELVIS W CONTRAST  Result Date: 01/21/2023 CLINICAL DATA:  Severe right lower quadrant and groin pain. Several weeks postop from sigmoid colectomy and descending colostomy for perforated diverticulitis. EXAM: CT ABDOMEN AND PELVIS WITH CONTRAST TECHNIQUE: Multidetector CT imaging of the abdomen and pelvis was performed using the standard protocol following bolus administration of intravenous contrast. RADIATION DOSE REDUCTION: This exam was performed according to the departmental dose-optimization program which includes automated exposure control, adjustment of the mA and/or kV according to patient size and/or use of iterative reconstruction technique. CONTRAST:  OMNIPAQUE IOHEXOL 300 MG/ML  SOLN COMPARISON:  01/09/2023 FINDINGS: Lower Chest: No acute findings. Hepatobiliary: No suspicious hepatic masses identified. Stable tiny sub-cm hepatic cyst. Mild diffuse hepatic steatosis. Gallbladder is unremarkable. No evidence of biliary ductal dilatation. Pancreas:  No mass or inflammatory changes. Spleen: Within normal limits in size and appearance. Adrenals/Urinary Tract: No suspicious masses identified. Renal calculi again seen in lower pole of left kidney, largest measuring 16 mm.  No evidence of ureteral calculi or hydronephrosis. Unremarkable unopacified urinary bladder. Stomach/Bowel: Postop changes again seen from sigmoid colon resection with left lower quadrant colostomy and Hartman's pouch. No evidence of obstruction, inflammatory process or abnormal fluid collections. Mild colonic diverticulosis noted, however, there are no signs of diverticulitis. Stable small hiatal hernia. Vascular/Lymphatic: No pathologically enlarged lymph nodes. No acute vascular findings. Reproductive: Stable 3 cm calcified uterine fibroid. Adnexal regions are unremarkable. Other:  None. Musculoskeletal:  No suspicious bone lesions identified. IMPRESSION: No acute findings. Mild colonic diverticulosis, without radiographic evidence of diverticulitis. Stable left renal calculi. No evidence of ureteral calculi or hydronephrosis. Stable small hiatal hernia. Stable 3 cm calcified uterine fibroid. Mild hepatic steatosis. Electronically Signed   By: Danae Orleans M.D.   On: 01/21/2023 12:44   DG Hip Unilat With Pelvis 2-3 Views Right  Result  Date: 01/21/2023 CLINICAL DATA:  Hip pain without injury. Right groin pain this started this morning and radiates to the buttocks. EXAM: DG HIP (WITH OR WITHOUT PELVIS) 3V RIGHT COMPARISON:  None Available. FINDINGS: Generalized osteopenia. No notable degenerative spurring or hip joint narrowing. Coarse calcification in the right paramedian pelvis measuring 3.7 cm, likely uterine fibroid. IMPRESSION: No acute finding. Electronically Signed   By: Tiburcio Pea M.D.   On: 01/21/2023 12:21     Positive ROS: All other systems have been reviewed and were otherwise negative with the exception of those mentioned in the HPI and as above.  Physical Exam: BP 121/60 (BP Location: Left Arm)   Pulse 75   Temp 98.1 F (36.7 C)   Resp 18   Ht 5\' 4"  (1.626 m)   Wt 80.7 kg   SpO2 95%   BMI 30.55 kg/m  General:  Alert, no acute distress Psychiatric:  Patient is competent for  consent with normal mood and affect     Orthopedic Exam:  RLE: 5/5 DF/PF/EHL SILT s/s/t/sp/dp distr Foot wwp RoM right hip: Able to forward flex to 70 degrees, IR 10, ER 10.  Contralateral hip can forward flex to 90 degrees, IR 30, ER 30  Imaging:  As above: No fractures or dislocations.  Hip joint effusion noted on MRI.  Aspirate results: 6 cc of fluid was aspirated from the hip joint.  Per IR team, it was slightly cloudy yellow synovial fluid.   Component     Latest Ref Rng 01/22/2023  Color, Synovial     YELLOW  YELLOW   Appearance-Synovial     CLEAR  HAZY !   Crystals, Fluid INTRACELLULAR CALCIUM PYROPHOSPHATE CRYSTALS   WBC, Synovial     0 - 200 /cu mm 7,476 (H)   Neutrophil, Synovial     % 86   Lymphocytes-Synovial Fld     % 1   Monocyte-Macrophage-Synovial Fluid     % 13   Eosinophils-Synovial     % 0     Legend: ! Abnormal (H) High   Assessment/Plan: 67 year old female with acute exacerbation of pseudogout of the right hip 1.  Recommend treatment of pseudogout as this likely explains her clinical symptoms.  This would likely consist of high-dose NSAIDs unless otherwise contraindicated  2.  PT/OT for appropriate mobilization  3.  No plan for surgical intervention at this time.  4.  Please page with any questions.  Patient may follow-up at [facility] clinic as an outpatient on an as-needed basis.    Signa Kell   01/22/2023 2:09 PM

## 2023-01-22 NOTE — Plan of Care (Signed)

## 2023-01-22 NOTE — Plan of Care (Signed)
  Problem: Education: Goal: Knowledge of General Education information will improve Description: Including pain rating scale, medication(s)/side effects and non-pharmacologic comfort measures Outcome: Progressing   Problem: Health Behavior/Discharge Planning: Goal: Ability to manage health-related needs will improve Outcome: Progressing   Problem: Clinical Measurements: Goal: Ability to maintain clinical measurements within normal limits will improve Outcome: Progressing Goal: Will remain free from infection Outcome: Progressing Goal: Diagnostic test results will improve Outcome: Progressing Goal: Respiratory complications will improve Outcome: Progressing Goal: Cardiovascular complication will be avoided Outcome: Progressing   Problem: Activity: Goal: Risk for activity intolerance will decrease Outcome: Progressing   Problem: Elimination: Goal: Will not experience complications related to bowel motility Outcome: Progressing Goal: Will not experience complications related to urinary retention Outcome: Progressing   Problem: Pain Management: Goal: General experience of comfort will improve Outcome: Progressing

## 2023-01-22 NOTE — Progress Notes (Addendum)
  Progress Note   Patient: Michele Sanders WUJ:[id number] DOB: September 06, 1955 DOA: 01/21/2023     1 DOS: the patient was seen and examined on 01/22/2023   Brief hospital course: Elliana Sandiego is a 67 y.o. female with medical history significant of recent Hartman's procedure after a perforated ductulitis, with a colostomy, HTN, GERD, presented with sudden onset of right hip pain.  MRI scan showed a small amount of right hip effusion, consistent with infection.  Patient has been seen by orthopedics, IR has performed aspiration 11/17. Patient was also seen by general surgery, abdominal wound does not appear to be infected.   Principal Problem:   Septic arthritis of hip (HCC) Active Problems:   Essential hypertension   Wound dehiscence, surgical   Septic arthritis (HCC)   Hypomagnesemia   Assessment and Plan: Right hip joint effusion suspecting septic arthritis. Recent diverticulitis with perforation. Abdominal wound. Patient does not meet sepsis criteria, antibiotics on hold pending IR aspiration.  Patient is status post IR aspiration, await study results to decide on antibiotics.  I also sent out blood cultures prior to antibiotic administration. Patient has been seen by general surgery, abdominal wound does not seem to be worsened.  Unlikely to be the source of infection. Most likely source is recent diverticulitis with perforation, but blood culture at that time was negative.  Addendum: Joint tap RBC more than 7000, spoke with Dr. Allena Katz, it appeared to be pseudogout, no need for antibiotics. Will treat with colchicine and NSAIDS. Consider steroids after negative cultures.   Hypomagnesemia Hypokalemia. Magnesium 1.0.  Give 4 g of magnesium sulfate.  Potassium has normalized.  Essential hypertension. Continue some home medicines.  Obesity with BMI 30.55. Diet exercise advised.    Subjective:  Patient still have significant pain in the right hip.  No fever or chills, no nausea  vomiting.  Physical Exam: Vitals:   01/21/23 1806 01/21/23 1822 01/22/23 0303 01/22/23 0804  BP: (!) 158/80 (!) 163/93 127/70 121/60  Pulse: 79 78 74 75  Resp: 20 18  18   Temp: 98.8 F (37.1 C)  98.2 F (36.8 C) 98.1 F (36.7 C)  TempSrc: Oral  Oral   SpO2: 97% 100% 94% 95%  Weight:      Height:       General exam: Appears calm and comfortable  Respiratory system: Clear to auscultation. Respiratory effort normal. Cardiovascular system: S1 & S2 heard, RRR. No JVD, murmurs, rubs, gallops or clicks. No pedal edema. Gastrointestinal system: Abdomen is nondistended, soft and nontender. No organomegaly or masses felt. Normal bowel sounds heard. Central nervous system: Alert and oriented. No focal neurological deficits. Extremities: Symmetric 5 x 5 power. Skin: No rashes, lesions or ulcers Psychiatry: Judgement and insight appear normal. Mood & affect appropriate.    Data Reviewed:  Reviewed CT scan results, lab results.  Family Communication: Updated patient's sister and husband.  Also called daughter.  Disposition: Status is: Inpatient Remains inpatient appropriate because: Severity of disease, IV treatment, pending inpatient procedure.     Time spent: 55 minutes  Author: Marrion Coy, MD 01/22/2023 9:47 AM  For on call review [email or web address].

## 2023-01-22 NOTE — Consult Note (Signed)
SURGICAL CONSULTATION NOTE   HISTORY OF PRESENT ILLNESS (HPI):  67 y.o. female presented to [facility] ED for evaluation of right groin pain. Patient reports she has not been able to mobilize due to pain in the right hip.  Pain started yesterday.  Patient unable to bear weight due to the pain.  Pain is on the right hip.  No pain radiation.  Pain aggravated by putting pressure.  No alleviating factors.  Patient denies any trauma.  Patient had a history of recent perforated diverticulitis with Hartman's procedure.  At the ED she was found with tenderness to palpation in the right hip.  There was leukocytosis of 16.3.  No fever.  No tachycardia.  X-ray of the right hip shows no acute finding.  CT scan of the abdomen and pelvis also negative for intra-abdominal complications.  MRI of the right hip shows small to moderate hip effusion.  I personally evaluated the images.  Orthopedic surgery was consulted and recommended aspiration of the fluid to rule out septic joint.  Surgery was consulted for evaluation of open wound in the mid abdomen.  Upon evaluation of the chart patient with known small dehiscence of the upper portion of the wound being taking care with Aquacel.  Patient denies any worsening pain or drainage.  Surgery is consulted by Dr. Chipper Herb in this context for evaluation and management of open wound of the abdominal wall.  PAST MEDICAL HISTORY (PMH):  Past Medical History:  Diagnosis Date   Arthritis    GERD (gastroesophageal reflux disease)    History of kidney stones    Hyperparathyroidism (HCC)    Hypertension    Pneumonia    PONV (postoperative nausea and vomiting)    Pre-diabetes      PAST SURGICAL HISTORY (PSH):  Past Surgical History:  Procedure Laterality Date   APPENDECTOMY     BACK SURGERY     L4-L5   BREAST BIOPSY Left 04/09/2019   neg   BREAST CYST ASPIRATION Left 04/08/2019   neg   COLECTOMY WITH COLOSTOMY CREATION/HARTMANN PROCEDURE N/A 12/31/2022   Procedure:  LAPAROTOMY WITH COLOSTOMY CREATION/HARTMANN PROCEDURE;  Surgeon: Leafy Ro, MD;  Location: [facility] ORS;  Service: General;  Laterality: N/A;   COLONOSCOPY W/ POLYPECTOMY     CYSTOSCOPY/URETEROSCOPY/HOLMIUM LASER/STENT PLACEMENT Left 09/29/2020   Procedure: CYSTOSCOPY/URETEROSCOPY, /STENT PLACEMENT;  Surgeon: Riki Altes, MD;  Location: [facility] ORS;  Service: Urology;  Laterality: Left;   CYSTOSCOPY/URETEROSCOPY/HOLMIUM LASER/STENT PLACEMENT Left 11/10/2020   Procedure: CYSTOSCOPY/URETEROSCOPY/HOLMIUM LASER/STENT PLACEMENT;  Surgeon: Riki Altes, MD;  Location: [facility] ORS;  Service: Urology;  Laterality: Left;   FRACTURE SURGERY Right    humerus 2005   IR URETERAL STENT LEFT NEW ACCESS W/O SEP NEPHROSTOMY CATH  01/31/2022     MEDICATIONS:  Prior to Admission medications   Medication Sig Start Date End Date Taking? Authorizing Provider  acetaminophen (TYLENOL) 500 MG tablet Take 1,000 mg by mouth every 6 (six) hours as needed.   Yes [provider]  aspirin 81 MG chewable tablet Chew 81 mg by mouth daily.   Yes [provider]  cholecalciferol (VITAMIN D3) 25 MCG (1000 UNIT) tablet Take 1,000 Units by mouth daily.   Yes [provider]  cyanocobalamin (VITAMIN B12) 1000 MCG tablet Take 1,000 mcg by mouth daily.   Yes [provider]  docusate sodium (COLACE) 100 MG capsule Take 1 capsule (100 mg total) by mouth daily as needed. 01/31/22 01/31/23 Yes Vanna Scotland, MD  ibuprofen (ADVIL) 200 MG tablet  Take 200 mg by mouth every 6 (six) hours as needed. 2 tablets   Yes [provider]  lisinopril (ZESTRIL) 20 MG tablet Take 20 mg by mouth daily. 12/12/22  Yes [provider]  ondansetron (ZOFRAN-ODT) 4 MG disintegrating tablet Take 1 tablet (4 mg total) by mouth every 6 (six) hours as needed for nausea. 01/06/23  Yes Donovan Kail, PA-C  pantoprazole (PROTONIX) 40 MG tablet Take 40 mg by mouth daily.   Yes [provider]   simvastatin (ZOCOR) 40 MG tablet Take 40 mg by mouth daily at 6 PM.   Yes [provider]  traMADol (ULTRAM) 50 MG tablet Take 1 tablet (50 mg total) by mouth every 6 (six) hours as needed for moderate pain (pain score 4-6) or severe pain (pain score 7-10) (mild pain). 01/06/23  Yes Donovan Kail, PA-C     ALLERGIES:  No Known Allergies   SOCIAL HISTORY:  Social History   Socioeconomic History   Marital status: Married    Spouse name: Earl Lites   Number of children: Not on file   Years of education: Not on file   Highest education level: Not on file  Occupational History   Not on file  Tobacco Use   Smoking status: Former    Current packs/day: 0.50    Average packs/day: 0.5 packs/day for 30.0 years (15.0 ttl pk-yrs)    Types: Cigarettes    Passive exposure: Past   Smokeless tobacco: Never  Vaping Use   Vaping status: Never Used  Substance and Sexual Activity   Alcohol use: Yes    Comment: wine nightly   Drug use: Never   Sexual activity: Not on file  Other Topics Concern   Not on file  Social History Narrative   Not on file   Social Determinants of Health   Financial Resource Strain: Low Risk  (12/12/2022)   Received from [facility]   Overall Financial Resource Strain (CARDIA)    Difficulty of Paying Living Expenses: Not hard at all  Food Insecurity: No Food Insecurity (01/21/2023)   Hunger Vital Sign    Worried About Running Out of Food in the Last Year: Never true    Ran Out of Food in the Last Year: Never true  Transportation Needs: No Transportation Needs (01/09/2023)   PRAPARE - Administrator, Civil Service (Medical): No    Lack of Transportation (Non-Medical): No  Physical Activity: Not on file  Stress: Not on file  Social Connections: Not on file  Intimate Partner Violence: Not At Risk (01/21/2023)   Humiliation, Afraid, Rape, and Kick questionnaire    Fear of Current or Ex-Partner: No    Emotionally Abused: No     Physically Abused: No    Sexually Abused: No      FAMILY HISTORY:  Family History  Problem Relation Age of Onset   Breast cancer Neg Hx      REVIEW OF SYSTEMS:  Constitutional: denies weight loss, fever, chills, or sweats  Eyes: denies any other vision changes, history of eye injury  ENT: denies sore throat, hearing problems  Respiratory: denies shortness of breath, wheezing  Cardiovascular: denies chest pain, palpitations  Gastrointestinal: Denies abdominal pain, nausea and vomiting Genitourinary: denies burning with urination or urinary frequency Musculoskeletal: Positive for hip pain Skin: denies any other rashes or skin discolorations  Neurological: denies any other headache, dizziness, weakness  Psychiatric: denies any other depression, anxiety   All other review of  systems were negative   VITAL SIGNS:  Temp:  [98.2 F (36.8 C)-98.8 F (37.1 C)] 98.2 F (36.8 C) (11/17 0303) Pulse Rate:  [72-95] 74 (11/17 0303) Resp:  [18-20] 18 (11/16 1822) BP: (127-163)/(70-93) 127/70 (11/17 0303) SpO2:  [94 %-100 %] 94 % (11/17 0303) Weight:  [80.7 kg] 80.7 kg (11/16 1032)     Height: 5\' 4"  (162.6 cm) Weight: 80.7 kg BMI (Calculated): 30.54   INTAKE/OUTPUT:  This shift: No intake/output data recorded.  Last 2 shifts: @IOLAST2SHIFTS @   PHYSICAL EXAM:  Constitutional:  -- Normal body habitus  -- Awake, alert, and oriented x3  Eyes:  -- Pupils equally round and reactive to light  -- No scleral icterus  Ear, nose, and throat:  -- No jugular venous distension  Pulmonary:  -- No crackles  -- Equal breath sounds bilaterally -- Breathing non-labored at rest Cardiovascular:  -- S1, S2 present  -- No pericardial rubs Gastrointestinal:  -- Abdomen soft, nontender, non-distended, no guarding or rebound tenderness.  Colostomy is pink and patent. -- Upper portion of the abdominal wound with serous drainage.  Aquacel in place. Musculoskeletal and Integumentary:  -- Wounds: 4  cm dehiscence of the upper portion of the wound.  No cellulitis. -- Extremities: B/L UE and LE FROM, hands and feet warm, no edema  Neurologic:  -- Motor function: intact and symmetric -- Sensation: intact and symmetric   Labs:     Latest Ref Rng & Units 01/22/2023    5:33 AM 01/21/2023   11:12 AM 01/10/2023    9:34 AM  CBC  WBC 4.0 - 10.5 K/uL 10.3  16.3  13.7   Hemoglobin 12.0 - 15.0 g/dL 40.9  81.1  91.4   Hematocrit 36.0 - 46.0 % 35.3  36.2  36.6   Platelets 150 - 400 K/uL 274  305  459       Latest Ref Rng & Units 01/21/2023   11:12 AM 01/11/2023    4:50 AM 01/10/2023    9:34 AM  CMP  Glucose 70 - 99 mg/dL 782  70  89   BUN 8 - 23 mg/dL 13  13  17    Creatinine 0.44 - 1.00 mg/dL 9.56  2.13  0.86   Sodium 135 - 145 mmol/L 138  138  140   Potassium 3.5 - 5.1 mmol/L 3.2  3.4  3.4   Chloride 98 - 111 mmol/L 103  104  107   CO2 22 - 32 mmol/L 24  22  21    Calcium 8.9 - 10.3 mg/dL 7.6  8.3  8.6   Total Protein 6.5 - 8.1 g/dL 6.8     Total Bilirubin <1.2 mg/dL 0.7     Alkaline Phos 38 - 126 U/L 92     AST 15 - 41 U/L 26     ALT 0 - 44 U/L 38       Assessment/Plan:  67 y.o. female with open wound of the abdominal wall, complicated by pertinent comorbidities including recent Hartman's procedure, hip pain concerning, ruling out septic joint.  General surgery consulted for evaluation of dehiscence of the wound.  This is a known dehiscence being followed by wound care nurses as outpatient.  Patient currently receiving local care with Aquacel.  No sign of worsening infection, no need of drainage or debridement at this moment.  I put orders to do local care with Aquacel and Vashe cleansing solution.  Gae Gallop, MD

## 2023-01-22 NOTE — Hospital Course (Addendum)
Michele Sanders is a 67 y.o. female with medical history significant of recent Hartman's procedure after a perforated ductulitis, with a colostomy, HTN, GERD, presented with sudden onset of right hip pain.  MRI scan showed a small amount of right hip effusion, consistent with infection.  Patient has been seen by orthopedics, IR has performed aspiration 11/17. Patient was also seen by general surgery, abdominal wound does not appear to be infected.

## 2023-01-22 NOTE — Procedures (Signed)
Interventional Radiology Procedure:   Indications: Right hip effusion, need to rule out septic arthritis  Procedure: Fluoroscopic guided right hip aspiration  Findings: 6 ml of slightly turbid yellow fluid was aspirated from right hip.   Complications: None     EBL: Minimal  Plan: Fluid sent for analysis  Katonya Blecher R. Lowella Dandy, MD  Pager: [phone]

## 2023-01-23 DIAGNOSIS — I10 Essential (primary) hypertension: Secondary | ICD-10-CM | POA: Diagnosis not present

## 2023-01-23 DIAGNOSIS — M11251 Other chondrocalcinosis, right hip: Secondary | ICD-10-CM

## 2023-01-23 DIAGNOSIS — T8131XS Disruption of external operation (surgical) wound, not elsewhere classified, sequela: Secondary | ICD-10-CM | POA: Diagnosis not present

## 2023-01-23 LAB — MAGNESIUM: Magnesium: 2.1 mg/dL (ref 1.7–2.4)

## 2023-01-23 LAB — CBC
HCT: 36.8 % (ref 36.0–46.0)
Hemoglobin: 11.8 g/dL — ABNORMAL LOW (ref 12.0–15.0)
MCH: 28.4 pg (ref 26.0–34.0)
MCHC: 32.1 g/dL (ref 30.0–36.0)
MCV: 88.7 fL (ref 80.0–100.0)
Platelets: 157 10*3/uL (ref 150–400)
RBC: 4.15 MIL/uL (ref 3.87–5.11)
RDW: 13.2 % (ref 11.5–15.5)
WBC: 7.2 10*3/uL (ref 4.0–10.5)
nRBC: 0 % (ref 0.0–0.2)

## 2023-01-23 LAB — BASIC METABOLIC PANEL
Anion gap: 10 (ref 5–15)
BUN: 13 mg/dL (ref 8–23)
CO2: 25 mmol/L (ref 22–32)
Calcium: 7.8 mg/dL — ABNORMAL LOW (ref 8.9–10.3)
Chloride: 105 mmol/L (ref 98–111)
Creatinine, Ser: 0.69 mg/dL (ref 0.44–1.00)
GFR, Estimated: >60 mL/min (ref >60)
Glucose, Bld: 112 mg/dL — ABNORMAL HIGH (ref 70–99)
Potassium: 3.6 mmol/L (ref 3.5–5.1)
Sodium: 140 mmol/L (ref 135–145)

## 2023-01-23 LAB — PHOSPHORUS: Phosphorus: 3.7 mg/dL (ref 2.5–4.6)

## 2023-01-23 MED ORDER — COLCHICINE 0.6 MG PO TABS: 0.6000 mg | ORAL_TABLET | Freq: Two times a day (BID) | ORAL | 0 refills | Status: DC

## 2023-01-23 MED ORDER — IBUPROFEN 200 MG PO TABS: 800.0000 mg | ORAL_TABLET | Freq: Three times a day (TID) | ORAL | 0 refills | Status: AC

## 2023-01-23 NOTE — Plan of Care (Signed)
  Problem: Education: Goal: Knowledge of General Education information will improve Description: Including pain rating scale, medication(s)/side effects and non-pharmacologic comfort measures Outcome: Progressing   Problem: Activity: Goal: Risk for activity intolerance will decrease Outcome: Progressing   Problem: Nutrition: Goal: Adequate nutrition will be maintained Outcome: Progressing   Problem: Coping: Goal: Level of anxiety will decrease Outcome: Progressing   Problem: Pain Management: Goal: General experience of comfort will improve Outcome: Progressing   Problem: Skin Integrity: Goal: Risk for impaired skin integrity will decrease Outcome: Progressing

## 2023-01-23 NOTE — Discharge Summary (Signed)
Physician Discharge Summary   Patient: Michele Sanders MRN: [id number] DOB: [birth year]  Admit date:     01/21/2023  Discharge date: 01/23/23  Discharge Physician: Marrion Coy   PCP: Patrice Paradise, MD   Recommendations at discharge:   Follow-up with PCP in 1 week. Call Dr. Allena Katz if needed to follow-up.   Follow-up on final results of blood culture and joint culture.  Discharge Diagnoses: Principal Problem:   Septic arthritis of hip (HCC) Active Problems:   Essential hypertension   Wound dehiscence, surgical   Septic arthritis (HCC)   Hypomagnesemia   Pseudogout of hip, right  Resolved Problems:   * No resolved hospital problems. *  Hospital Course: Michele Sanders is a 67 y.o. female with medical history significant of recent Hartman's procedure after a perforated ductulitis, with a colostomy, HTN, GERD, presented with sudden onset of right hip pain.  MRI scan showed a small amount of right hip effusion, consistent with infection.  Patient has been seen by orthopedics, IR has performed aspiration 11/17.  Discussed with Dr. Allena Katz, condition poor consistent with pseudogout instead of infection.  Patient was treated with ibuprofen and colchicine. Patient was also seen by general surgery, abdominal wound does not appear to be infected.   Assessment and Plan:  Right hip joint effusion secondary to pseudogout Septic joint ruled out. Recent diverticulitis with perforation. Abdominal wound. Patient does not meet sepsis criteria, antibiotics on hold pending IR aspiration.  Patient is status post IR aspiration,  Joint tap WBC more than 7000, spoke with Dr. Allena Katz, it appeared to be pseudogout, no need for antibiotics. Will treat with colchicine and NSAIDS.  So far blood culture negative.  Gram stain from joint fluid negative, culture still pending, but highly unlikely to be positive.  Patient be followed up with PCP, to review the final results. Patient condition much improved today,  joint pain essentially resolved.  Medically stable for discharge.   Hypomagnesemia Hypokalemia. Magnesium 1.0.  Give 4 g of magnesium sulfate.  Potassium has normalized.   Essential hypertension. Continue some home medicines.   Obesity with BMI 30.55. Diet exercise advised.       Consultants: General surgery, orthopedics. Procedures performed: Hip aspiration. Disposition: Home Diet recommendation:  Discharge Diet Orders (From admission, onward)     Start     Ordered   01/23/23 0000  Diet - low sodium heart healthy        01/23/23 1035           Cardiac diet DISCHARGE MEDICATION: Allergies as of 01/23/2023   No Known Allergies      Medication List     TAKE these medications    acetaminophen 500 MG tablet Commonly known as: TYLENOL Take 1,000 mg by mouth every 6 (six) hours as needed.   aspirin 81 MG chewable tablet Chew 81 mg by mouth daily.   cholecalciferol 25 MCG (1000 UNIT) tablet Commonly known as: VITAMIN D3 Take 1,000 Units by mouth daily.   colchicine 0.6 MG tablet Take 1 tablet (0.6 mg total) by mouth 2 (two) times daily for 7 days.   cyanocobalamin 1000 MCG tablet Commonly known as: VITAMIN B12 Take 1,000 mcg by mouth daily.   docusate sodium 100 MG capsule Commonly known as: Colace Take 1 capsule (100 mg total) by mouth daily as needed.   ibuprofen 200 MG tablet Commonly known as: ADVIL Take 4 tablets (800 mg total) by mouth every 8 (eight) hours for 3 days. 2 tablets What changed:  how much to take when to take this reasons to take this   lisinopril 20 MG tablet Commonly known as: ZESTRIL Take 20 mg by mouth daily.   ondansetron 4 MG disintegrating tablet Commonly known as: ZOFRAN-ODT Take 1 tablet (4 mg total) by mouth every 6 (six) hours as needed for nausea.   pantoprazole 40 MG tablet Commonly known as: PROTONIX Take 40 mg by mouth daily.   simvastatin 40 MG tablet Commonly known as: ZOCOR Take 40 mg by mouth daily  at 6 PM.   traMADol 50 MG tablet Commonly known as: ULTRAM Take 1 tablet (50 mg total) by mouth every 6 (six) hours as needed for moderate pain (pain score 4-6) or severe pain (pain score 7-10) (mild pain).               Discharge Care Instructions  (From admission, onward)           Start     Ordered   01/23/23 0000  Discharge wound care:       Comments: Keep clean   01/23/23 1035            Discharge Exam: Filed Weights   01/21/23 1032  Weight: 80.7 kg   General exam: Appears calm and comfortable  Respiratory system: Clear to auscultation. Respiratory effort normal. Cardiovascular system: S1 & S2 heard, RRR. No JVD, murmurs, rubs, gallops or clicks. No pedal edema. Gastrointestinal system: Abdomen is nondistended, soft and nontender. No organomegaly or masses felt. Normal bowel sounds heard. Central nervous system: Alert and oriented. No focal neurological deficits. Extremities: Symmetric 5 x 5 power. Skin: No rashes, lesions or ulcers Psychiatry: Judgement and insight appear normal. Mood & affect appropriate.    Condition at discharge: good  The results of significant diagnostics from this hospitalization (including imaging, microbiology, ancillary and laboratory) are listed below for reference.   Imaging Studies: DG FLUORO GUIDED NEEDLE PLC ASPIRATION/INJECTION LOC  Result Date: 01/22/2023 CLINICAL DATA:  Right hip pain and right hip effusion. Need to rule out septic arthritis. EXAM: RIGHT HIP ASPIRATION WITH FLUOROSCOPY FLUOROSCOPY TIME:  Radiation Exposure Index (as provided by the fluoroscopic device): 4.7 mGy Kerma PROCEDURE: The procedure was explained to the patient. The risks and benefits of the procedure were discussed and the patient's questions were addressed. Informed consent was obtained from the patient. Patient was placed supine. Right groin was prepped with chlorhexidine and sterile field was created. Skin was anesthetized using 1% lidocaine.  Using fluoroscopic guidance, a 20 gauge spinal needle was directed onto the lateral aspect of the right femoral head/neck junction. 6 mL of slightly turbid yellow fluid was aspirated. Needle was removed. Bandage placed over the puncture site. Fluid was sent for analysis. IMPRESSION: Technically successful right hip aspiration with fluoroscopy. Electronically Signed   By: Richarda Overlie M.D.   On: 01/22/2023 12:07   MR HIP RIGHT WO CONTRAST  Result Date: 01/21/2023 CLINICAL DATA:  Septic arthritis suspected, hip, xray done recent abdominal surgery, now with severe right hip pain, uptrending WBC, no fever EXAM: MR OF THE RIGHT HIP WITHOUT CONTRAST TECHNIQUE: Multiplanar, multisequence MR imaging was performed. No intravenous contrast was administered. COMPARISON:  X-ray 01/21/2023, CT 01/21/2023 FINDINGS: Bones: No acute fracture. No dislocation. No femoral head avascular necrosis. Bony pelvis intact without diastasis. SI joints and pubic symphysis within normal limits. No SI joint effusion. No erosions. No bone marrow edema. No marrow replacing bone lesion. Articular cartilage and labrum Articular cartilage: Mild chondral thinning along the superior  aspect of the right hip joint. No subchondral marrow edema. Labrum: Degeneration of the anterosuperior labrum. No paralabral cyst. Joint or bursal effusion Joint effusion:  Small-moderate-sized right hip joint effusion. Bursae: Mild right peritrochanteric bursal edema. Muscles and tendons Muscles and tendons: Mild tendinosis of the right gluteus medius and minimus tendons. The hamstring, iliopsoas, rectus femoris, and adductor tendons appear intact without tear or significant tendinosis. Normal muscle bulk and signal intensity without edema, atrophy, or fatty infiltration. Other findings Miscellaneous: No soft tissue edema or fluid collection. No inguinal lymphadenopathy. IMPRESSION: 1. Small-moderate-sized right hip joint effusion. In the setting of infection, septic  arthritis is not excluded and arthrocentesis should be performed. 2. Mild right hip osteoarthritis. 3. Mild tendinosis of the right gluteus medius and minimus tendons. 4. Mild right peritrochanteric bursitis. Electronically Signed   By: Duanne Guess D.O.   On: 01/21/2023 16:22   CT ABDOMEN PELVIS W CONTRAST  Result Date: 01/21/2023 CLINICAL DATA:  Severe right lower quadrant and groin pain. Several weeks postop from sigmoid colectomy and descending colostomy for perforated diverticulitis. EXAM: CT ABDOMEN AND PELVIS WITH CONTRAST TECHNIQUE: Multidetector CT imaging of the abdomen and pelvis was performed using the standard protocol following bolus administration of intravenous contrast. RADIATION DOSE REDUCTION: This exam was performed according to the departmental dose-optimization program which includes automated exposure control, adjustment of the mA and/or kV according to patient size and/or use of iterative reconstruction technique. CONTRAST:  OMNIPAQUE IOHEXOL 300 MG/ML  SOLN COMPARISON:  01/09/2023 FINDINGS: Lower Chest: No acute findings. Hepatobiliary: No suspicious hepatic masses identified. Stable tiny sub-cm hepatic cyst. Mild diffuse hepatic steatosis. Gallbladder is unremarkable. No evidence of biliary ductal dilatation. Pancreas:  No mass or inflammatory changes. Spleen: Within normal limits in size and appearance. Adrenals/Urinary Tract: No suspicious masses identified. Renal calculi again seen in lower pole of left kidney, largest measuring 16 mm. No evidence of ureteral calculi or hydronephrosis. Unremarkable unopacified urinary bladder. Stomach/Bowel: Postop changes again seen from sigmoid colon resection with left lower quadrant colostomy and Hartman's pouch. No evidence of obstruction, inflammatory process or abnormal fluid collections. Mild colonic diverticulosis noted, however, there are no signs of diverticulitis. Stable small hiatal hernia. Vascular/Lymphatic: No  pathologically enlarged lymph nodes. No acute vascular findings. Reproductive: Stable 3 cm calcified uterine fibroid. Adnexal regions are unremarkable. Other:  None. Musculoskeletal:  No suspicious bone lesions identified. IMPRESSION: No acute findings. Mild colonic diverticulosis, without radiographic evidence of diverticulitis. Stable left renal calculi. No evidence of ureteral calculi or hydronephrosis. Stable small hiatal hernia. Stable 3 cm calcified uterine fibroid. Mild hepatic steatosis. Electronically Signed   By: Danae Orleans M.D.   On: 01/21/2023 12:44   DG Hip Unilat With Pelvis 2-3 Views Right  Result Date: 01/21/2023 CLINICAL DATA:  Hip pain without injury. Right groin pain this started this morning and radiates to the buttocks. EXAM: DG HIP (WITH OR WITHOUT PELVIS) 3V RIGHT COMPARISON:  None Available. FINDINGS: Generalized osteopenia. No notable degenerative spurring or hip joint narrowing. Coarse calcification in the right paramedian pelvis measuring 3.7 cm, likely uterine fibroid. IMPRESSION: No acute finding. Electronically Signed   By: Tiburcio Pea M.D.   On: 01/21/2023 12:21   DG ABD ACUTE 2+V W 1V CHEST  Result Date: 01/11/2023 CLINICAL DATA:  Small bowel obstruction. EXAM: DG ABDOMEN ACUTE WITH 1 VIEW CHEST COMPARISON:  01/10/2023 FINDINGS: There is an enteric tube with tip and side port in the decompressed stomach. Persistent dilated loops of small bowel identified which appear  similar in caliber to the prior exam. The colon appears nondistended. Left kidney stones are again noted. Calcified uterine fibroid noted within the pelvis. Lower abdominal and pelvic midline skin staples. Heart size and mediastinal contours are normal. No pleural fluid, interstitial edema or airspace disease. IMPRESSION: 1. Persistent dilated loops of small bowel. Similar to previous exam. 2. Enteric tube with tip and side port in the stomach. 3. Left kidney stones. Electronically Signed   By: Signa Kell M.D.   On: 01/11/2023 11:42   DG ABD ACUTE 2+V W 1V CHEST  Result Date: 01/10/2023 CLINICAL DATA:  Small-bowel obstruction EXAM: DG ABDOMEN ACUTE WITH 1 VIEW CHEST COMPARISON:  01/09/2023 FINDINGS: Heart size and mediastinal contours are within normal limits. Aortic Atherosclerosis (ICD10-170.0). Lungs are clear. No effusion. Nasogastric tube to the decompressed stomach. A few gas dilated small bowel loops in the right upper abdomen, with multiple fluid levels on the erect radiograph. Colon decompressed. No free air. Left urolithiasis. Bladder calculi. Calcified uterine fibroid. Midline skin staples over the lower abdomen. IMPRESSION: 1. Nasogastric tube to the decompressed stomach. 2. Persistent small bowel dilatation. 3. Left urolithiasis and bladder calculi. Electronically Signed   By: Corlis Leak M.D.   On: 01/10/2023 14:06   DG Abdomen 1 View  Result Date: 01/09/2023 CLINICAL DATA:  NG tube placement EXAM: ABDOMEN - 1 VIEW COMPARISON:  11/12/2021.  CT earlier today FINDINGS: NG tube tip in the mid stomach. IMPRESSION: NG tube in the stomach. Electronically Signed   By: Charlett Nose M.D.   On: 01/09/2023 03:32   CT ABDOMEN PELVIS W CONTRAST  Result Date: 01/09/2023 CLINICAL DATA:  Patient comes to the ER with ongoing abdominal pain nausea. On 12/31/2022 underwent sigmoid colectomy with Hartmann's pouch creation and left lower abdominal descending colostomy. Initially presented with perforated diverticulitis 12/29/2022. EXAM: CT ABDOMEN AND PELVIS WITH CONTRAST TECHNIQUE: Multidetector CT imaging of the abdomen and pelvis was performed using the standard protocol following bolus administration of intravenous contrast. RADIATION DOSE REDUCTION: This exam was performed according to the departmental dose-optimization program which includes automated exposure control, adjustment of the mA and/or kV according to patient size and/or use of iterative reconstruction technique. CONTRAST:  OMNIPAQUE  IOHEXOL 350 MG/ML SOLN COMPARISON:  Preoperative CT with IV contrast 12/29/2022, 09/27/2022. FINDINGS: Lower chest: Lung bases are mildly emphysematous. There is no consolidation or effusion. There is linear atelectasis in the lingular base. Small hiatal hernia.  The cardiac size is normal. Hepatobiliary: The liver is mildly steatotic with improvement in the steatosis, measuring 19.5 cm length. Stable hypodensity measuring 5 mm again noted in segment 5, too small to characterize. There is no mass enhancement. Gallbladder and bile ducts are unremarkable. Pancreas: No abnormality Spleen: No abnormality. Adrenals/Urinary Tract: There is no adrenal mass. 5.3 cm Bosniak 1 cyst again noted in the outer right kidney, additional much smaller cysts bilaterally. Scarring and volume loss again seen in the inferior pole on left as well as left lower pole calyceal stones largest is 1.6 cm. There is no hydronephrosis or ureteral stone. The bladder is contracted and not well seen. Stomach/Bowel: Small hiatal hernia with chronic thickened folds in the proximal stomach. Probable chronic gastritis. There are undigested medication tablets in the duodenum, jejunum and cecum. Prior finding of free intraperitoneal air has resolved. There are midline laparotomy skin staples and interval sigmoid colectomy with Hartmann's pouch creation and left lower quadrant descending colostomy. There is now dilatation of the upper and left mid to lower  abdominal small bowel up to 5 cm, decompression of the majority of the right abdominal small bowel. The transitional segment abuts the anterior wall in the right lower abdomen a few cm off the midline, on series 2 axial 60-65 and on coronal reconstruction image 25 of series 5, possibly indicating adhesive disease. There is new demonstration of a wide-mouth transmesenteric internal hernia of the ascending mesocolon but the transitional segment is medial to this. Multiple small bowel segments extend out  lateral to the ascending colon The cecum is mobile in comparison to the prior studies now located in the pelvis just above the bladder. The appendix again noted surgically absent. Vascular/Lymphatic: Aortic atherosclerosis. No enlarged abdominal or pelvic lymph nodes. Reproductive: Fibroid uterus with largest calcified fibroid 3 cm in the fundus, retroverted. No adnexal mass. Other: Trace ascites. No abdominal wall incarcerated hernia. No abscess, free hemorrhage or free air. Musculoskeletal: Osteopenia and degenerative change of the spine. Slight levoscoliosis lumbar spine. IMPRESSION: 1. Interval sigmoid colectomy with Hartmann's pouch creation and left lower quadrant descending colostomy. 2. Interval development of small-bowel obstruction with transitional segment in the right lower abdomen abutting the anterior wall, possibly adhesive disease. 3. New observation of a wide-mouth transmesenteric internal hernia of the ascending mesocolon but the transitional segment is medial to this. 4. Mobile cecum in comparison to the prior studies now located in the pelvis just above the bladder. 5. Trace ascites. No abscess, free hemorrhage or free air. 6. Emphysema. 7. Aortic atherosclerosis. 8. Nonobstructive left nephrolithiasis. 9. Fibroid uterus and remaining findings described above. Aortic Atherosclerosis (ICD10-I70.0) and Emphysema (ICD10-J43.9). Electronically Signed   By: Almira Bar M.D.   On: 01/09/2023 01:41   DG ABD ACUTE 2+V W 1V CHEST  Result Date: 01/04/2023 CLINICAL DATA:  Postsurgical abdominal pain or ileus. EXAM: DG ABDOMEN ACUTE WITH 1 VIEW CHEST COMPARISON:  January 03, 2023. FINDINGS: Dilated small bowel loops are noted in right side of abdomen consistent with ileus or distal small bowel obstruction. Midline surgical staples are noted. Surgical drain is noted in the pelvis. Calcified uterine fibroid is noted in the pelvis. Heart size and mediastinal contours are within normal limits. Both lungs  are clear. IMPRESSION: Dilated small bowel loops are noted consistent with ileus or distal small bowel obstruction. No acute cardiopulmonary disease. Electronically Signed   By: Lupita Raider M.D.   On: 01/04/2023 10:47   DG Abd 2 Views  Result Date: 01/03/2023 CLINICAL DATA:  Postop nausea and vomiting EXAM: ABDOMEN - 2 VIEW COMPARISON:  12/29/2022 FINDINGS: Supine and upright frontal views of the abdomen and pelvis are obtained. Postsurgical changes are seen from midline laparotomy. Surgical drain projects over the lower pelvis. Distended gas-filled loops of small bowel are identified, measuring up to 4 cm in diameter. Minimal gas within nondistended loops of distal small bowel and colon. Left-sided colostomy. No free gas within the greater peritoneal sac. Lung bases are clear. IMPRESSION: 1. Distended gas-filled loops of proximal small bowel, concerning for small bowel obstruction. Postoperative ileus could also be considered. Continued radiographic follow-up recommended. 2. Postsurgical changes from midline laparotomy, partial colectomy, and left-sided colostomy. Electronically Signed   By: Sharlet Salina M.D.   On: 01/03/2023 22:39   CT ABDOMEN PELVIS W CONTRAST  Result Date: 12/29/2022 CLINICAL DATA:  Left lower quadrant abdominal pain EXAM: CT ABDOMEN AND PELVIS WITH CONTRAST TECHNIQUE: Multidetector CT imaging of the abdomen and pelvis was performed using the standard protocol following bolus administration of intravenous contrast. RADIATION DOSE REDUCTION:  This exam was performed according to the departmental dose-optimization program which includes automated exposure control, adjustment of the mA and/or kV according to patient size and/or use of iterative reconstruction technique. CONTRAST:  OMNIPAQUE IOHEXOL 300 MG/ML  SOLN COMPARISON:  09/27/2022 FINDINGS: Lower chest: Centrilobular emphysema. Mild dependent subsegmental atelectasis in the lower lobes and lingula. Mild cardiomegaly.  Small to moderate-sized hiatal hernia. Hepatobiliary: Diffuse hepatic steatosis. Stable 4 mm hypodense lesion favoring cyst in the right hepatic lobe on image 36 series 3. No biliary dilatation. No portal venous gas observed. Pancreas: Unremarkable Spleen: Unremarkable Adrenals/Urinary Tract: 5.1 by 4.5 cm simple right renal cyst, image 42 series 3. No further imaging workup of this lesion is indicated. Benign 1.1 cm left renal cyst on image 35 series 3. No further imaging workup of this lesion is indicated. Benign 1.6 by 0.8 cm left renal cyst on image 40 series 3. No further imaging workup of this lesion is indicated. Mild scarring of the left kidney lower pole. Three left kidney lower pole calculi, the largest measuring 1.6 cm in long axis on image 42 series 3. Stomach/Bowel: Scattered free intraperitoneal gas compatible with perforated viscus. The sigmoid colon diverticulitis on image 83 series 3, this is highly likely to be the cause of the free intraperitoneal gas no current abscess pocket. This site of abnormal bowel wall thickening and extraluminal gas along with mesenteric stranding is just distal to the prior site of inflammatory wall thickening and diverticulitis shown on 09/27/2022. Mildly mobile cecum in the right abdomen. Several high-density pill-like structures are present in the cecum. Scattered diverticula of the descending colon. Vascular/Lymphatic: Atherosclerosis is present, including aortoiliac atherosclerotic disease. Mild atheromatous plaque proximally in the SMA without high-grade stenosis. No pathologic adenopathy. Reproductive: Dense calcified 3.1 cm right uterine body fibroid, image 83 series 3, stable. Other: No supplemental non-categorized findings. Musculoskeletal: Mild lower lumbar spondylosis and degenerative disc disease. IMPRESSION: 1. Scattered moderate free intraperitoneal gas compatible with perforated viscus. The source of this free gas is likely from acute sigmoid colon  diverticulitis, just distal to the prior site of inflammatory wall thickening and diverticulitis shown on 09/27/2022. No current abscess pocket. 2. Diffuse hepatic steatosis. 3. Small to moderate-sized hiatal hernia. 4. Left kidney lower pole nonobstructing calculi. 5. Mild cardiomegaly. 6. Centrilobular emphysema. 7. Aortic atherosclerosis. Aortic Atherosclerosis (ICD10-I70.0) and Emphysema (ICD10-J43.9). Critical Value/emergent results were called by telephone at the time of interpretation on 12/29/2022 at 9:15 am to provider [facility] , who verbally acknowledged these results. Electronically Signed   By: Gaylyn Rong M.D.   On: 12/29/2022 09:15    Microbiology: Results for orders placed or performed during the hospital encounter of 01/21/23  Culture, blood (Routine X 2) w Reflex to ID Panel     Status: None (Preliminary result)   Collection Time: 01/22/23  7:53 AM   Specimen: BLOOD  Result Value Ref Range Status   Specimen Description BLOOD BLOOD RIGHT ARM  Final   Special Requests   Final    BOTTLES DRAWN AEROBIC AND ANAEROBIC Blood Culture adequate volume   Culture   Final    NO GROWTH < 24 HOURS Performed at [facility], [address] [place], [place] [address]    Report Status PENDING  Incomplete  Culture, blood (Routine X 2) w Reflex to ID Panel     Status: None (Preliminary result)   Collection Time: 01/22/23  7:59 AM   Specimen: BLOOD  Result Value Ref Range Status   Specimen  Description BLOOD BLOOD LEFT HAND  Final   Special Requests   Final    BOTTLES DRAWN AEROBIC AND ANAEROBIC Blood Culture adequate volume   Culture   Final    NO GROWTH < 24 HOURS Performed at [facility], [address] [place], [place] [address]    Report Status PENDING  Incomplete  Body fluid culture w Gram Stain     Status: None (Preliminary result)   Collection Time: 01/22/23  9:00 AM   Specimen: Synovial, Right Hip; Body Fluid  Result Value Ref Range Status    Specimen Description   Final    SYNOVIAL Performed at [facility], [address] [place], [place] [address]    Special Requests   Final    R HIP Performed at [facility], [address] [place], [place] [address]    Gram Stain   Final    FEW WBC PRESENT, PREDOMINANTLY PMN NO ORGANISMS SEEN Performed at [facility], [address] [place], [place] [address]    Culture PENDING  Incomplete   Report Status PENDING  Incomplete    Labs: CBC: Recent Labs  Lab 01/21/23 1112 01/22/23 0533 01/23/23 0308  WBC 16.3* 10.3 7.2  NEUTROABS 12.9*  --   --   HGB 12.1 11.5* 11.8*  HCT 36.2 35.3* 36.8  MCV 87.0 89.8 88.7  PLT 305 274 157   Basic Metabolic Panel: Recent Labs  Lab 01/21/23 1112 01/22/23 0753 01/23/23 0308  NA 138 139 140  K 3.2* 3.8 3.6  CL 103 104 105  CO2 24 23 25   GLUCOSE 122* 97 112*  BUN 13 12 13   CREATININE 0.66 0.67 0.69  CALCIUM 7.6* 7.2* 7.8*  MG  --  1.0* 2.1  PHOS  --   --  3.7   Liver Function Tests: Recent Labs  Lab 01/21/23 1112  AST 26  ALT 38  ALKPHOS 92  BILITOT 0.7  PROT 6.8  ALBUMIN 3.7   CBG: No results for input(s): "GLUCAP" in the last 168 hours.  Discharge time spent: greater than 30 minutes.  Signed: Marrion Coy, MD [facility] 01/23/2023

## 2023-01-23 NOTE — Progress Notes (Signed)
Patient was given verbal and written discharge instructions, states she will comply, waiting on ride to go home.

## 2023-01-23 NOTE — Discharge Instructions (Signed)
Follow-up with PCP in 1 week. Call Dr. Allena Katz at [phone] if need orthopedic follow-up.  If condition resolves, no need to follow up with him.

## 2023-01-23 NOTE — Progress Notes (Addendum)
[facility] SURGICAL PROGRESS NOTE  Hospital Day(s): 2.   Interval History:  Patient seen and examined No acute events or new complaints overnight.  Patient reports she is doing much better this morning Right hip pain improved; ROM improving No abdominal pain, fever, chills, nausea, emesis No issues with wound; this is known to [place] and healing via secondary intention after an area of superficial bleeding was controlled  She is tolerating diet No issues with ostomy care/management at home   Vital signs in last 24 hours: [min-max] current  Temp:  [97.8 F (36.6 C)-98.3 F (36.8 C)] 98.3 F (36.8 C) (11/18 0749) Pulse Rate:  [74-87] 74 (11/18 0749) Resp:  [16-18] 16 (11/18 0749) BP: (140-170)/(59-80) 170/80 (11/18 0749) SpO2:  [92 %-96 %] 96 % (11/18 0749)     Height: 5\' 4"  (162.6 cm) Weight: 80.7 kg BMI (Calculated): 30.54   Intake/Output last 2 shifts:  No intake/output data recorded.   Physical Exam:  Constitutional: alert, cooperative and no distress  Respiratory: breathing non-labored at rest  Cardiovascular: regular rate and sinus rhythm  Gastrointestinal: soft, non-tender, and non-distended. Colostomy in left abdomen; gas in bag Integumentary: Laparotomy well healed with known area superiorly healing via secondary intention, no evidence of infection   Labs:     Latest Ref Rng & Units 01/23/2023    3:08 AM 01/22/2023    5:33 AM 01/21/2023   11:12 AM  CBC  WBC 4.0 - 10.5 K/uL 7.2  10.3  16.3   Hemoglobin 12.0 - 15.0 g/dL 13.2  44.0  10.2   Hematocrit 36.0 - 46.0 % 36.8  35.3  36.2   Platelets 150 - 400 K/uL 157  274  305       Latest Ref Rng & Units 01/23/2023    3:08 AM 01/22/2023    7:53 AM 01/21/2023   11:12 AM  CMP  Glucose 70 - 99 mg/dL 725  97  366   BUN 8 - 23 mg/dL 13  12  13    Creatinine 0.44 - 1.00 mg/dL 4.40  3.47  4.25   Sodium 135 - 145 mmol/L 140  139  138   Potassium 3.5 - 5.1 mmol/L 3.6  3.8  3.2   Chloride 98 - 111 mmol/L  105  104  103   CO2 22 - 32 mmol/L 25  23  24    Calcium 8.9 - 10.3 mg/dL 7.8  7.2  7.6   Total Protein 6.5 - 8.1 g/dL   6.8   Total Bilirubin <1.2 mg/dL   0.7   Alkaline Phos 38 - 126 U/L   92   AST 15 - 41 U/L   26   ALT 0 - 44 U/L   38     Imaging studies: No new pertinent imaging studies   Assessment/Plan:  67 y.o. female with stable, known, area of her laparotomy healing via secondary intention without complication s/p Hartman's on 10/26 for perforated diverticulitis, admitted with right hip pain found to have pseudogout    - Please note this wound was opened on POD3 secondary to superficial bleeding from the skin edge controlled with silver nitrate and NOT secondary to infection. This was left to heal via secondary intention.    - Wound Care: Appreciate Dr Will Bonnet evaluation and orders. Agree with daily wound changes with Aquacel/Vashe. Okay to remove to shower.    - No need for Abx from wound perspective - Okay for diet from surgical perspective - No issues with  ostomy output/management - has seen ostomy clinic outpatient   - Appreciate orthopedic recommendations/assessment  - Okay to mobilize as tolerated  - Further management per primary service    - Nothing further from general surgery standpoint. Wound care as above is appropriate. She can follow up as scheduled on 12/02. We will be available as needed, please call with questions/concerns  All of the above findings and recommendations were discussed with the patient, and the medical team, and all of patient's questions were answered to her expressed satisfaction.  -- Lynden Oxford, PA-C [facility] 01/23/2023, 8:23 AM M-F: 7am - 4pm

## 2023-01-24 ENCOUNTER — Other Ambulatory Visit (HOSPITAL_COMMUNITY): Payer: Self-pay | Admitting: Nurse Practitioner

## 2023-01-24 ENCOUNTER — Ambulatory Visit (HOSPITAL_COMMUNITY)
Admission: RE | Admit: 2023-01-24 | Discharge: 2023-01-24 | Disposition: A | Discharge status: Home / Self Care | Payer: Medicare HMO | Source: Ambulatory Visit | Attending: Plastic Surgery | Admitting: Plastic Surgery

## 2023-01-24 DIAGNOSIS — Y839 Surgical procedure, unspecified as the cause of abnormal reaction of the patient, or of later complication, without mention of misadventure at the time of the procedure: Secondary | ICD-10-CM | POA: Insufficient documentation

## 2023-01-24 DIAGNOSIS — T8131XD Disruption of external operation (surgical) wound, not elsewhere classified, subsequent encounter: Secondary | ICD-10-CM | POA: Insufficient documentation

## 2023-01-24 DIAGNOSIS — Z933 Colostomy status: Secondary | ICD-10-CM | POA: Insufficient documentation

## 2023-01-24 DIAGNOSIS — L24B3 Irritant contact dermatitis related to fecal or urinary stoma or fistula: Secondary | ICD-10-CM

## 2023-01-24 DIAGNOSIS — Z433 Encounter for attention to colostomy: Secondary | ICD-10-CM

## 2023-01-24 LAB — GLUCOSE, BODY FLUID OTHER: Glucose, Body Fluid Other: 53 mg/dL

## 2023-01-24 LAB — PROTEIN, BODY FLUID (OTHER): Total Protein, Body Fluid Other: 3.6 g/dL

## 2023-01-24 NOTE — Progress Notes (Signed)
[facility]   Reason for visit:  LLQ colostomy Dehisced surgical wound to midline, distal and proximal ends.  Steri strips to distal end.  Packing proximal end with aquacel  HPI:  Perforated diverticulum with end colostomy Past Medical History:  Diagnosis Date   Arthritis    GERD (gastroesophageal reflux disease)    History of kidney stones    Hyperparathyroidism (HCC)    Hypertension    Pneumonia    PONV (postoperative nausea and vomiting)    Pre-diabetes    Family History  Problem Relation Age of Onset   Breast cancer Neg Hx    No Known Allergies Current Outpatient Medications  Medication Sig Dispense Refill Last Dose   acetaminophen (TYLENOL) 500 MG tablet Take 1,000 mg by mouth every 6 (six) hours as needed.      aspirin 81 MG chewable tablet Chew 81 mg by mouth daily.      cholecalciferol (VITAMIN D3) 25 MCG (1000 UNIT) tablet Take 1,000 Units by mouth daily.      colchicine 0.6 MG tablet Take 1 tablet (0.6 mg total) by mouth 2 (two) times daily for 7 days. 14 tablet 0    cyanocobalamin (VITAMIN B12) 1000 MCG tablet Take 1,000 mcg by mouth daily.      docusate sodium (COLACE) 100 MG capsule Take 1 capsule (100 mg total) by mouth daily as needed. 30 capsule 2    ibuprofen (ADVIL) 200 MG tablet Take 4 tablets (800 mg total) by mouth every 8 (eight) hours for 3 days. 2 tablets 30 tablet 0    lisinopril (ZESTRIL) 20 MG tablet Take 20 mg by mouth daily.      ondansetron (ZOFRAN-ODT) 4 MG disintegrating tablet Take 1 tablet (4 mg total) by mouth every 6 (six) hours as needed for nausea. 20 tablet 0    pantoprazole (PROTONIX) 40 MG tablet Take 40 mg by mouth daily.      simvastatin (ZOCOR) 40 MG tablet Take 40 mg by mouth daily at 6 PM.      traMADol (ULTRAM) 50 MG tablet Take 1 tablet (50 mg total) by mouth every 6 (six) hours as needed for moderate pain (pain score 4-6) or severe pain (pain score 7-10) (mild pain). 30 tablet 0    No current facility-administered  medications for this encounter.   ROS  Review of Systems  Constitutional:  Positive for fatigue.  Gastrointestinal:        LLQ colostomy  Skin:  Positive for wound.       Surgical wound to midline with dehiscence  Psychiatric/Behavioral: Negative.    All other systems reviewed and are negative.  Vital signs:  BP (!) 172/91 (BP Location: Right Arm)   Pulse 78   Temp 98.1 F (36.7 C) (Oral)   Resp 18   SpO2 96%  Exam:  Physical Exam Vitals reviewed.  Constitutional:      Appearance: Normal appearance.  Cardiovascular:     Rate and Rhythm: Normal rate and regular rhythm.  Pulmonary:     Effort: Pulmonary effort is normal.     Breath sounds: Normal breath sounds.  Abdominal:     Palpations: Abdomen is soft.  Skin:    General: Skin is warm and dry.  Neurological:     Mental Status: She is alert and oriented to person, place, and time.  Psychiatric:        Mood and Affect: Mood normal.        Behavior: Behavior normal.  Stoma type/location:  LLQ colostomy Stomal assessment/size:  1 1/4" budded pink and moist Peristomal assessment:  intact  Treatment options for stomal/peristomal skin: barrier ring and 2 piece pouch  Output: soft brown stool Ostomy pouching: 2pc.  Education provided:  will set up with [facility]  Once [facility] discharges, she will need DME for supplies.     Impression/dx  Colostomy Dehisced surgical wound  Discussion  Pouch change completed.  Performed wound care.  Recently at ED for pseudogout to right hip.  Is much better after aspiration Plan  See back 2 weeks    Visit time: 50 minutes.   Maple Hudson FNP-BC

## 2023-01-24 NOTE — Discharge Instructions (Signed)
Will set up with [facility] ?

## 2023-01-25 LAB — BODY FLUID CULTURE W GRAM STAIN: Culture: NO GROWTH

## 2023-01-27 LAB — CULTURE, BLOOD (ROUTINE X 2)
Culture: NO GROWTH
Culture: NO GROWTH
Special Requests: ADEQUATE
Special Requests: ADEQUATE

## 2023-02-06 ENCOUNTER — Ambulatory Visit (INDEPENDENT_AMBULATORY_CARE_PROVIDER_SITE_OTHER): Payer: Medicare HMO | Admitting: Surgery

## 2023-02-06 ENCOUNTER — Encounter: Payer: Self-pay | Admitting: Surgery

## 2023-02-06 VITALS — BP 157/103 | HR 84 | Temp 98.1°F | Ht 64.0 in | Wt 176.2 lb

## 2023-02-06 DIAGNOSIS — Z09 Encounter for follow-up examination after completed treatment for conditions other than malignant neoplasm: Secondary | ICD-10-CM

## 2023-02-06 DIAGNOSIS — K5720 Diverticulitis of large intestine with perforation and abscess without bleeding: Secondary | ICD-10-CM

## 2023-02-06 NOTE — Patient Instructions (Signed)

## 2023-02-06 NOTE — Progress Notes (Signed)
Michele Sanders is 5 and half weeks out from [facility] procedure for perforated diverticulitis. Michele Sanders is doing well.  Tolerating diet, ambulating.  She is doing ostomy changes. Open wound almost completely healed.  No fevers no chills  PE NAD Abd: Soft nontender colostomy is working well and patent.  Small pinpoint opening without evidence of infection.  Of the wound has completely healed  A/P Doing very well. No Surgical issues at this time.  See her back in about 3 months or so and at that time we will likely perform a barium enema plus or minus CT scan for operative planning. Colostomy takedown 6 months from the time of Hartman's

## 2023-02-07 ENCOUNTER — Ambulatory Visit (HOSPITAL_COMMUNITY)
Admission: RE | Admit: 2023-02-07 | Discharge: 2023-02-07 | Disposition: A | Discharge status: Home / Self Care | Payer: Medicare HMO | Source: Ambulatory Visit | Attending: Plastic Surgery | Admitting: Plastic Surgery

## 2023-02-07 DIAGNOSIS — Z433 Encounter for attention to colostomy: Secondary | ICD-10-CM | POA: Insufficient documentation

## 2023-02-07 DIAGNOSIS — T81329D Deep disruption or dehiscence of operation wound, unspecified, subsequent encounter: Secondary | ICD-10-CM | POA: Diagnosis not present

## 2023-02-07 NOTE — Progress Notes (Signed)
[facility]   Reason for visit:  LLQ colostomy  patient is now managing independently Dehisced surgical wound, nearly healed.   HPI:  Perforated diverticulum with end colostomy Past Medical History:  Diagnosis Date   Arthritis    GERD (gastroesophageal reflux disease)    History of kidney stones    Hyperparathyroidism (HCC)    Hypertension    Pneumonia    PONV (postoperative nausea and vomiting)    Pre-diabetes    Family History  Problem Relation Age of Onset   Breast cancer Neg Hx    No Known Allergies Current Outpatient Medications  Medication Sig Dispense Refill Last Dose   acetaminophen (TYLENOL) 500 MG tablet Take 1,000 mg by mouth every 6 (six) hours as needed. (Patient not taking: Reported on 02/06/2023)      aspirin 81 MG chewable tablet Chew 81 mg by mouth daily.      cholecalciferol (VITAMIN D3) 25 MCG (1000 UNIT) tablet Take 1,000 Units by mouth daily.      colchicine 0.6 MG tablet Take 1 tablet (0.6 mg total) by mouth 2 (two) times daily for 7 days. 14 tablet 0    cyanocobalamin (VITAMIN B12) 1000 MCG tablet Take 1,000 mcg by mouth daily.      lisinopril (ZESTRIL) 20 MG tablet Take 20 mg by mouth daily.      ondansetron (ZOFRAN-ODT) 4 MG disintegrating tablet Take 1 tablet (4 mg total) by mouth every 6 (six) hours as needed for nausea. 20 tablet 0    pantoprazole (PROTONIX) 40 MG tablet Take 40 mg by mouth daily.      simvastatin (ZOCOR) 40 MG tablet Take 40 mg by mouth daily at 6 PM.      traMADol (ULTRAM) 50 MG tablet Take 1 tablet (50 mg total) by mouth every 6 (six) hours as needed for moderate pain (pain score 4-6) or severe pain (pain score 7-10) (mild pain). (Patient not taking: Reported on 02/06/2023) 30 tablet 0    No current facility-administered medications for this encounter.   ROS  Review of Systems  Constitutional:  Positive for fatigue.  Gastrointestinal:        LLQ colostomy  Musculoskeletal:  Positive for gait problem.       Using  walker inside hospital   Skin:  Positive for wound.       Surgical wound, dehisced at proximal end, has improved and only needs dry dressing now.   All other systems reviewed and are negative.  Vital signs:  BP (!) 169/102 (BP Location: Right Arm)   Pulse 91   Temp 98.2 F (36.8 C) (Oral)   Resp 18   SpO2 95%  Exam:  Physical Exam Vitals (BP elevated on arrival  down to 148/92 at conclusion of visit) reviewed.  Cardiovascular:     Rate and Rhythm: Normal rate and regular rhythm.  Abdominal:     Palpations: Abdomen is soft.  Skin:    General: Skin is warm and dry.  Neurological:     Mental Status: She is alert and oriented to person, place, and time.  Psychiatric:        Mood and Affect: Mood normal.        Behavior: Behavior normal.     Stoma type/location:  LLQ end colostomy  Stomal assessment/size:  1 3/4" pink budded stoma Peristomal assessment:  midline incision, dehisced at proximal end, early epithelialization, surgeon states no longer need to pack, dry dressing only.  Treatment options for stomal/peristomal  skin: stoma powder, skin prep barrier ring and 2 piece pouch  ostomy belt.  Output: soft brown stool Ostomy pouching: 2pc.  Education provided:  pouch change performed.  Dry dressing to midline incision daily     Impression/dx  Colostomy Healing surgical wound Discussion  No change in pouching.  Is set up with [facility] for supplies Plan  Continue same pouching procedure No longer packing surgical wound    Visit time: 55  minutes.   Maple Hudson FNP-BC

## 2023-02-08 ENCOUNTER — Other Ambulatory Visit (HOSPITAL_COMMUNITY): Payer: Self-pay | Admitting: Nurse Practitioner

## 2023-02-08 DIAGNOSIS — L24B3 Irritant contact dermatitis related to fecal or urinary stoma or fistula: Secondary | ICD-10-CM

## 2023-02-08 DIAGNOSIS — Z433 Encounter for attention to colostomy: Secondary | ICD-10-CM

## 2023-02-11 NOTE — Discharge Instructions (Signed)
No changes   Back as needed   YOU ARE DOING GREAT !

## 2023-03-27 ENCOUNTER — Other Ambulatory Visit: Payer: Self-pay | Admitting: Physician Assistant

## 2023-03-27 DIAGNOSIS — Z1231 Encounter for screening mammogram for malignant neoplasm of breast: Secondary | ICD-10-CM

## 2023-04-07 ENCOUNTER — Ambulatory Visit (HOSPITAL_COMMUNITY)
Admission: RE | Admit: 2023-04-07 | Discharge: 2023-04-07 | Disposition: A | Discharge status: Home / Self Care | Payer: Medicare Other | Source: Ambulatory Visit | Attending: Nurse Practitioner | Admitting: Nurse Practitioner

## 2023-04-07 DIAGNOSIS — L24B3 Irritant contact dermatitis related to fecal or urinary stoma or fistula: Secondary | ICD-10-CM | POA: Insufficient documentation

## 2023-04-07 DIAGNOSIS — Z933 Colostomy status: Secondary | ICD-10-CM | POA: Diagnosis present

## 2023-04-07 DIAGNOSIS — Z433 Encounter for attention to colostomy: Secondary | ICD-10-CM | POA: Diagnosis not present

## 2023-04-07 NOTE — Progress Notes (Incomplete)
[facility]   Reason for visit:  *** HPI:  *** Past Medical History:  Diagnosis Date   Arthritis    GERD (gastroesophageal reflux disease)    History of kidney stones    Hyperparathyroidism (HCC)    Hypertension    Pneumonia    PONV (postoperative nausea and vomiting)    Pre-diabetes    Family History  Problem Relation Age of Onset   Breast cancer Neg Hx    No Known Allergies Current Outpatient Medications  Medication Sig Dispense Refill Last Dose/Taking   acetaminophen (TYLENOL) 500 MG tablet Take 1,000 mg by mouth every 6 (six) hours as needed. (Patient not taking: Reported on 02/06/2023)      aspirin 81 MG chewable tablet Chew 81 mg by mouth daily.      cholecalciferol (VITAMIN D3) 25 MCG (1000 UNIT) tablet Take 1,000 Units by mouth daily.      colchicine 0.6 MG tablet Take 1 tablet (0.6 mg total) by mouth 2 (two) times daily for 7 days. 14 tablet 0    cyanocobalamin (VITAMIN B12) 1000 MCG tablet Take 1,000 mcg by mouth daily.      lisinopril (ZESTRIL) 20 MG tablet Take 20 mg by mouth daily.      ondansetron (ZOFRAN-ODT) 4 MG disintegrating tablet Take 1 tablet (4 mg total) by mouth every 6 (six) hours as needed for nausea. 20 tablet 0    pantoprazole (PROTONIX) 40 MG tablet Take 40 mg by mouth daily.      simvastatin (ZOCOR) 40 MG tablet Take 40 mg by mouth daily at 6 PM.      traMADol (ULTRAM) 50 MG tablet Take 1 tablet (50 mg total) by mouth every 6 (six) hours as needed for moderate pain (pain score 4-6) or severe pain (pain score 7-10) (mild pain). (Patient not taking: Reported on 02/06/2023) 30 tablet 0    No current facility-administered medications for this encounter.   ROS  Review of Systems Vital signs:  There were no vitals taken for this visit. Exam:  Physical Exam  Stoma type/location:  *** Stomal assessment/size:  *** Peristomal assessment:  *** Treatment options for stomal/peristomal skin: *** Output: *** Ostomy pouching: 1pc./2pc.   Education provided:  ***    Impression/dx  *** Discussion  *** Plan  ***    Visit time: *** minutes.   Mike Gip FNP-BC

## 2023-04-12 DIAGNOSIS — L24B3 Irritant contact dermatitis related to fecal or urinary stoma or fistula: Secondary | ICD-10-CM | POA: Insufficient documentation

## 2023-05-10 ENCOUNTER — Ambulatory Visit
Admission: RE | Admit: 2023-05-10 | Discharge: 2023-05-10 | Disposition: A | Discharge status: Home / Self Care | Payer: Medicare HMO | Source: Ambulatory Visit | Attending: Physician Assistant | Admitting: Physician Assistant

## 2023-05-10 DIAGNOSIS — Z1231 Encounter for screening mammogram for malignant neoplasm of breast: Secondary | ICD-10-CM | POA: Diagnosis present

## 2023-05-17 ENCOUNTER — Ambulatory Visit (INDEPENDENT_AMBULATORY_CARE_PROVIDER_SITE_OTHER): Payer: Medicare HMO | Admitting: Surgery

## 2023-05-17 ENCOUNTER — Encounter: Payer: Self-pay | Admitting: Surgery

## 2023-05-17 VITALS — BP 154/84 | HR 71 | Ht 64.0 in | Wt 175.0 lb

## 2023-05-17 DIAGNOSIS — Z433 Encounter for attention to colostomy: Secondary | ICD-10-CM

## 2023-05-17 DIAGNOSIS — K5732 Diverticulitis of large intestine without perforation or abscess without bleeding: Secondary | ICD-10-CM | POA: Diagnosis not present

## 2023-05-17 DIAGNOSIS — K9400 Colostomy complication, unspecified: Secondary | ICD-10-CM

## 2023-05-17 NOTE — Patient Instructions (Addendum)
 Before we can do the reversal surgery we will need for you to have a CT scan and Barium enema.  We can also schedule these for you. We will call you with that information.   We will have you follow up with Dr Everlene Farrier to speak further about your surgery.

## 2023-05-18 ENCOUNTER — Other Ambulatory Visit: Payer: Self-pay | Admitting: Physician Assistant

## 2023-05-18 DIAGNOSIS — R928 Other abnormal and inconclusive findings on diagnostic imaging of breast: Secondary | ICD-10-CM

## 2023-05-19 NOTE — Progress Notes (Signed)
 Outpatient Surgical Follow Up   Michele Sanders is an 68 y.o. female.   Chief Complaint  Patient presents with   Follow-up    HPI: Michele Sanders is 4-1/2 months out from [facility] procedure from perforated diverticulitis.  She is doing very well.  No fevers no chills.  No major abdominal pain.  Her colostomy is working well.  She is ambulating and driving.  Her healed.  She wishes to discuss possible colostomy takedown.  He is up-to-date on colonoscopies.  Past Medical History:  Diagnosis Date   Arthritis    GERD (gastroesophageal reflux disease)    History of kidney stones    Hyperparathyroidism (HCC)    Hypertension    Pneumonia    PONV (postoperative nausea and vomiting)    Pre-diabetes     Past Surgical History:  Procedure Laterality Date   APPENDECTOMY     BACK SURGERY     L4-L5   BREAST BIOPSY Left 04/09/2019   neg   BREAST CYST ASPIRATION Left 04/08/2019   neg   COLECTOMY WITH COLOSTOMY CREATION/HARTMANN PROCEDURE N/A 12/31/2022   Procedure: LAPAROTOMY WITH COLOSTOMY CREATION/HARTMANN PROCEDURE;  Surgeon: Leafy Ro, MD;  Location: [facility];  Service: General;  Laterality: N/A;   COLONOSCOPY W/ POLYPECTOMY     CYSTOSCOPY/URETEROSCOPY/HOLMIUM LASER/STENT PLACEMENT Left 09/29/2020   Procedure: CYSTOSCOPY/URETEROSCOPY, /STENT PLACEMENT;  Surgeon: Riki Altes, MD;  Location: [facility];  Service: Urology;  Laterality: Left;   CYSTOSCOPY/URETEROSCOPY/HOLMIUM LASER/STENT PLACEMENT Left 11/10/2020   Procedure: CYSTOSCOPY/URETEROSCOPY/HOLMIUM LASER/STENT PLACEMENT;  Surgeon: Riki Altes, MD;  Location: [facility] OR[facility];  Service: Urology;  Laterality: Left;   FRACTURE SURGERY Right    humerus 2005   IR URETERAL STENT LEFT NEW ACCESS W/O SEP NEPHROSTOMY CATH  01/31/2022    Family History  Problem Relation Age of Onset   Breast cancer Neg Hx     Social History:  reports that she has quit smoking. Her smoking use included cigarettes. She has a 15 pack-year smoking history. She  has been exposed to tobacco smoke. She has never used smokeless tobacco. She reports current alcohol use. She reports that she does not use drugs.  Allergies: No Known Allergies  Medications reviewed.    ROS Full ROS performed and is otherwise negative other than what is stated in HPI   BP (!) 154/84   Pulse 71   Ht 5\' 4"  (1.626 m)   Wt 175 lb (79.4 kg)   SpO2 96%   BMI 30.04 kg/m   Physical Exam Vitals and nursing note reviewed. Exam conducted with a chaperone present.  Constitutional:      General: She is not in acute distress.    Appearance: Normal appearance.  Cardiovascular:     Rate and Rhythm: Normal rate and regular rhythm.     Heart sounds: No murmur heard. Pulmonary:     Effort: Pulmonary effort is normal.     Breath sounds: Normal breath sounds.  Abdominal:     General: Abdomen is flat. There is no distension.     Palpations: Abdomen is soft. There is no mass.     Tenderness: There is no abdominal tenderness. There is no guarding or rebound.     Hernia: No hernia is present.     Comments: Colostomy in place.  No clear evidence of parastomal hernia on exam.  Wound Has completely healed  Musculoskeletal:     Cervical back: Normal range of motion and neck supple. No rigidity or tenderness.  Skin:  General: Skin is warm.     Capillary Refill: Capillary refill takes less than 2 seconds.  Neurological:     General: No focal deficit present.     Mental Status: She is alert.  Psychiatric:        Mood and Affect: Mood normal.        Behavior: Behavior normal.        Thought Content: Thought content normal.        Judgment: Judgment normal.       Assessment/Plan: 68 year old female status post Hartman for perforated diverticulitis.  She is doing very well.  Questionable parastomal hernia.  I will like to obtain a CT scan of the abdomen and pelvis well as a barium enema and see her in a month or 2.  Depending on her preference we will schedule colostomy  takedown in the near future Please note that I spent 30 minutes in this encounter including review of medical records, review of imaging studies personally ,  Placing orders and performing documentation   Sterling Big, MD [facility] General Surgeon

## 2023-05-25 ENCOUNTER — Telehealth: Payer: Self-pay

## 2023-05-25 ENCOUNTER — Other Ambulatory Visit: Payer: Self-pay | Admitting: Physician Assistant

## 2023-05-25 ENCOUNTER — Ambulatory Visit
Admission: RE | Admit: 2023-05-25 | Discharge: 2023-05-25 | Disposition: A | Discharge status: Home / Self Care | Source: Ambulatory Visit | Attending: Physician Assistant | Admitting: Physician Assistant

## 2023-05-25 DIAGNOSIS — R928 Other abnormal and inconclusive findings on diagnostic imaging of breast: Secondary | ICD-10-CM | POA: Insufficient documentation

## 2023-05-25 NOTE — Telephone Encounter (Signed)
 Message left for the patient to call back about her CT and Barium enema.  The patient is scheduled for a Barium enema and CT scan at [facility] on 05/31/23. She will need to [facility] entrance and check in at 10:45 am. She will be there for a few hours for these exams.

## 2023-05-31 ENCOUNTER — Ambulatory Visit
Admission: RE | Admit: 2023-05-31 | Discharge: 2023-05-31 | Disposition: A | Discharge status: Home / Self Care | Source: Ambulatory Visit | Attending: Surgery

## 2023-05-31 ENCOUNTER — Ambulatory Visit
Admission: RE | Admit: 2023-05-31 | Discharge: 2023-05-31 | Disposition: A | Discharge status: Home / Self Care | Source: Ambulatory Visit | Attending: Surgery | Admitting: Surgery

## 2023-05-31 DIAGNOSIS — K9400 Colostomy complication, unspecified: Secondary | ICD-10-CM | POA: Insufficient documentation

## 2023-05-31 DIAGNOSIS — K5732 Diverticulitis of large intestine without perforation or abscess without bleeding: Secondary | ICD-10-CM | POA: Diagnosis present

## 2023-05-31 LAB — POCT I-STAT CREATININE: Creatinine, Ser: 0.9 mg/dL (ref 0.44–1.00)

## 2023-05-31 MED ORDER — IOHEXOL 300 MG/ML  SOLN
450.0000 mL | Freq: Once | INTRAMUSCULAR | Status: AC | PRN
  Administered 2023-05-31: 450 mL

## 2023-05-31 MED ORDER — IOHEXOL 300 MG/ML  SOLN
100.0000 mL | Freq: Once | INTRAMUSCULAR | Status: AC | PRN
  Administered 2023-05-31: 100 mL via INTRAVENOUS

## 2023-06-01 ENCOUNTER — Telehealth: Payer: Self-pay

## 2023-06-01 NOTE — Telephone Encounter (Signed)
-----   Message from [facility] sent at 06/01/2023  7:42 AM EDT ----- Please let her know that both CT and enema looked relly good, no infection or any concerns ----- Message ----- From: Interface, Rad Results In Sent: 05/31/2023   4:59 PM EDT To: Leafy Ro, MD

## 2023-06-01 NOTE — Telephone Encounter (Signed)
 Message left for the patient letting her know that her CT and Barium enema were normal.

## 2023-06-19 ENCOUNTER — Ambulatory Visit (INDEPENDENT_AMBULATORY_CARE_PROVIDER_SITE_OTHER): Admitting: Surgery

## 2023-06-19 ENCOUNTER — Encounter: Payer: Self-pay | Admitting: Surgery

## 2023-06-19 VITALS — BP 130/72 | HR 76 | Temp 98.1°F | Ht 64.0 in | Wt 177.0 lb

## 2023-06-19 DIAGNOSIS — Z433 Encounter for attention to colostomy: Secondary | ICD-10-CM

## 2023-06-19 DIAGNOSIS — K5732 Diverticulitis of large intestine without perforation or abscess without bleeding: Secondary | ICD-10-CM

## 2023-06-19 NOTE — Patient Instructions (Addendum)
 We will have you follow up here in about 3-4 weeks to go over everything.      Please call and ask to speak with a nurse if you develop questions or concerns.  We have discussed removing a portion of your damaged colon through 4 small incisions today. We will schedule this surgery at [facility] with Dr. Everlene Farrier. Please plan a hospital stay of 3-7 days for surgery and recovery time.  If you are on any injectable weight loss medication, you will need to stop taking your GLP-1 injectable (weight loss) medications 8 days before your surgery to avoid any complications with anesthesia.   We will have you complete a bowel prep prior to your surgery. Please see information provided.  You have also been given a (Blue) Pre-Care Sheet with more information regarding your particular surgery. Our surgery scheduler will call you to verify surgery date and to go over information.  Please review all information given.  You will need to arrange to be out of work for approximately 2 weeks and then you may return with a lifting restriction for 4 more weeks. If you have FMLA or Disability paperwork that needs to be filled out, please have your company fax your paperwork to [phone] or you may drop this by either office. This paperwork will be filled out within 3 days after your surgery has been completed.  Please call our office with any questions or concerns prior to your scheduled surgery.   Laparoscopic Colectomy Laparoscopic colectomy is surgery to remove part or all of the large intestine (colon). This procedure may be used to treat several conditions, including: Inflammation and infection of the colon (diverticulitis). Tumors or masses in the colon. Inflammatory bowel disease, such as Crohn disease or ulcerative colitis. Colectomy is an option when symptoms cannot be controlled with medicines. Bleeding from the colon that cannot be controlled by another method. Blockage or obstruction of the colon.  Tell  a health care provider about: Any allergies you have. All medicines you are taking, including vitamins, herbs, eye drops, creams, and over-the-counter medicines. Any problems you or family members have had with anesthetic medicines. Any blood disorders you have. Any surgeries you have had. Any medical conditions you have. What are the risks? Generally, this is a safe procedure. However, problems may occur, including: Infection. Bleeding. Allergic reactions to medicines or dyes. Damage to other structures or organs. Leaking from where the colon was sewn together. Future blockage of the small intestines from scar tissue. Another surgery may be needed to repair this. Needing to convert to an open procedure. Complications such as damage to other organs or excessive bleeding may require the surgeon to convert from a laparoscopic procedure to an open procedure. This involves making a larger incision in the abdomen.  What happens before the procedure?  Medicines Ask your health care provider about: Changing or stopping your regular medicines. This is especially important if you are taking diabetes medicines or blood thinners. Taking medicines such as aspirin and ibuprofen. These medicines can thin your blood. Do not take these medicines before your procedure if your health care provider instructs you not to. You may be given antibiotic medicine to clean out bacteria from your colon. Follow the directions carefully and take the medicine at the correct time. General instructions You may be prescribed an oral bowel prep to clean out your colon in preparation for the surgery: Follow instructions from your health care provider about how to do this. Do not eat  or drink anything else after you have started the bowel prep, unless your health care provider tells you it is safe to do so. Do not use any products that contain nicotine or tobacco, such as cigarettes and e-cigarettes. If you need help  quitting, ask your health care provider. What happens during the procedure? To reduce your risk of infection: Your health care team will wash or sanitize their hands. Your skin will be washed with soap. An IV tube will be inserted into one of your veins to deliver fluid and medication. You will be given one of the following: A medicine to help you relax (sedative). A medicine to make you fall asleep (general anesthetic). Small monitors will be connected to your body. They will be used to check your heart, blood pressure, and oxygen level. A breathing tube may be placed into your lungs during the procedure. A thin, flexible tube (catheter) will be placed into your bladder to drain urine. A tube may be placed through your nose and into your stomach to drain stomach fluids (nasogastric tube, or NG tube). Your abdomen will be filled with air so it expands. This gives the surgeon more room to operate and makes your organs easier to see. Several small cuts (incisions) will be made in your abdomen. A thin, lighted tube with a tiny camera on the end (laparoscope) will be put through one of the small incisions. The camera on the laparoscope will send a picture to a computer screen in the operating room. This will give the surgeon a good view inside your abdomen. Hollow tubes will be put through the other small incisions in your abdomen. The tools that are needed for the procedure will be put through these tubes. Clamps or staples will be put on both ends of the diseased part of the colon. The part of the intestine between the clamps or staples will be removed. If possible, the ends of the healthy colon that remain will be stitched (sutured) or stapled together to allow your body to pass waste (stool). Sometimes, the remaining colon cannot be stitched back together. If this is the case, a colostomy will be needed. If you need a colostomy: An opening to the outside of your body (stoma) will be made through  your abdomen. The end of your colon will be brought to the opening. It will be stitched to the skin. A bag will be attached to the opening. Stool will drain into this removable bag. The colostomy may be temporary or permanent. The incisions from the colectomy will be closed with sutures or staples. The procedure may vary among health care providers and hospitals. What happens after the procedure? Your blood pressure, heart rate, breathing rate, and blood oxygen level will be monitored until the medicines you were given have worn off. You will receive fluids through an IV tube until your bowels start to work properly. Once your bowels are working again, you will be given clear liquids first and then solid food as tolerated. You will be given medicines to control your pain and nausea, if needed. Do not drive for 24 hours if you were given a sedative. This information is not intended to replace advice given to you by your health care provider. Make sure you discuss any questions you have with your health care provider. Document Released: 05/14/2002 Document Revised: 11/23/2015 Document Reviewed: 11/23/2015 Elsevier Interactive Patient Education  2018 [employer].     Laparoscopic Colectomy, Care After This sheet gives you information about  how to care for yourself after your procedure. Your health care provider may also give you more specific instructions. If you have problems or questions, contact your health care provider. What can I expect after the procedure? After your procedure, it is common to have the following: Pain in your abdomen, especially in the incision areas. You will be given medicine to control the pain. Tiredness. This is a normal part of the recovery process. Your energy level will return to normal over the next several weeks. Changes in your bowel movements, such as constipation or needing to go more often. Talk with your health care provider about how to manage  this.  Follow these instructions at home: Medicines Take over-the-counter and prescription medicines only as told by your health care provider. Do not drive or use heavy machinery while taking prescription pain medicine. Do not drink alcohol while taking prescription pain medicine. If you were prescribed an antibiotic medicine, use it as told by your health care provider. Do not stop using the antibiotic even if you start to feel better. Incision care Follow instructions from your health care provider about how to take care of your incision areas. Make sure you: Keep your incisions clean and dry. Wash your hands with soap and water before and after applying medicine to the areas, and before and after changing your bandage (dressing). If soap and water are not available, use hand sanitizer. Change your dressing as told by your health care provider. Leave stitches (sutures), skin glue, or adhesive strips in place. These skin closures may need to stay in place for 2 weeks or longer. If adhesive strip edges start to loosen and curl up, you may trim the loose edges. Do not remove adhesive strips completely unless your health care provider tells you to do that. Do not wear tight clothing over the incisions. Tight clothing may rub and irritate the incision areas, which may cause the incisions to open. Do not take baths, swim, or use a hot tub until your health care provider approves. Ask your health care provider if you can take showers. You may only be allowed to take sponge baths for bathing. Check your incision area every day for signs of infection. Check for: More redness, swelling, or pain. More fluid or blood. Warmth. Pus or a bad smell. Activity Avoid lifting anything that is heavier than 10 lb (4.5 kg) for 2 weeks or until your health care provider says it is okay. You may resume normal activities as told by your health care provider. Ask your health care provider what activities are safe for  you. Take rest breaks during the day as needed. Eating and drinking Follow instructions from your health care provider about what you can eat after surgery. To prevent or treat constipation while you are taking prescription pain medicine, your health care provider may recommend that you: Drink enough fluid to keep your urine clear or pale yellow. Take over-the-counter or prescription medicines. Eat foods that are high in fiber, such as fresh fruits and vegetables, whole grains, and beans. Limit foods that are high in fat and processed sugars, such as fried and sweet foods. General instructions Ask your health care provider when you will need an appointment to get your sutures or staples removed. Keep all follow-up visits as told by your health care provider. This is important. Contact a health care provider if: You have more redness, swelling, or pain around your incisions. You have more fluid or blood coming from the incisions.  Your incisions feel warm to the touch. You have pus or a bad smell coming from your incisions or your dressing. You have a fever. You have an incision that breaks open (edges not staying together) after sutures or staples have been removed. Get help right away if: You develop a rash. You have chest pain or difficulty breathing. You have pain or swelling in your legs. You feel light-headed or you faint. Your abdomen swells (becomes distended). You have nausea or vomiting. You have blood in your stool (feces). This information is not intended to replace advice given to you by your health care provider. Make sure you discuss any questions you have with your health care provider. Document Released: 09/10/2004 Document Revised: 11/23/2015 Document Reviewed: 11/23/2015 Elsevier Interactive Patient Education  [employer].

## 2023-06-20 ENCOUNTER — Telehealth: Payer: Self-pay | Admitting: Surgery

## 2023-06-20 NOTE — Telephone Encounter (Signed)
 Left message for patient to see if she is ready to schedule surgery with Dr. Dana Duncan.

## 2023-06-20 NOTE — Progress Notes (Signed)
 Outpatient Surgical Follow Up   Michele Sanders is an 68 y.o. female.   Chief Complaint  Patient presents with   Follow-up    HPI: Michele Sanders is 5-1/2 months out from [facility] procedure for perforated diverticulitis. She is doing very well. No fevers no chills. No major abdominal pain. Her colostomy is working well. She is ambulating and driving.  She did complete a CT of the abdomen and pelvis as well as a barium enema that I have personally reviewed showing no complicating features related to the colostomy or the [facility] pouch.  She wishes to discuss possible colostomy takedown. SHe is up-to-date on colonoscopies.   Past Medical History:  Diagnosis Date   Arthritis    GERD (gastroesophageal reflux disease)    History of kidney stones    Hyperparathyroidism (HCC)    Hypertension    Pneumonia    PONV (postoperative nausea and vomiting)    Pre-diabetes     Past Surgical History:  Procedure Laterality Date   APPENDECTOMY     BACK SURGERY     L4-L5   BREAST BIOPSY Left 04/09/2019   neg   BREAST CYST ASPIRATION Left 04/08/2019   neg   COLECTOMY WITH COLOSTOMY CREATION/HARTMANN PROCEDURE N/A 12/31/2022   Procedure: LAPAROTOMY WITH COLOSTOMY CREATION/HARTMANN PROCEDURE;  Surgeon: Alben Alma, MD;  Location: [facility] ORS;  Service: General;  Laterality: N/A;   COLONOSCOPY W/ POLYPECTOMY     CYSTOSCOPY/URETEROSCOPY/HOLMIUM LASER/STENT PLACEMENT Left 09/29/2020   Procedure: CYSTOSCOPY/URETEROSCOPY, /STENT PLACEMENT;  Surgeon: Geraline Knapp, MD;  Location: [facility] ORS;  Service: Urology;  Laterality: Left;   CYSTOSCOPY/URETEROSCOPY/HOLMIUM LASER/STENT PLACEMENT Left 11/10/2020   Procedure: CYSTOSCOPY/URETEROSCOPY/HOLMIUM LASER/STENT PLACEMENT;  Surgeon: Geraline Knapp, MD;  Location: [facility] ORS;  Service: Urology;  Laterality: Left;   FRACTURE SURGERY Right    humerus 2005   IR URETERAL STENT LEFT NEW ACCESS W/O SEP NEPHROSTOMY CATH  01/31/2022    Family History  Problem Relation Age  of Onset   Breast cancer Neg Hx     Social History:  reports that she has quit smoking. Her smoking use included cigarettes. She has a 15 pack-year smoking history. She has been exposed to tobacco smoke. She has never used smokeless tobacco. She reports current alcohol use. She reports that she does not use drugs.  Allergies: No Known Allergies  Medications reviewed.    ROS Full ROS performed and is otherwise negative other than what is stated in HPI   BP 130/72   Pulse 76   Temp 98.1 F (36.7 C)   Ht 5\' 4"  (1.626 m)   Wt 177 lb (80.3 kg)   SpO2 94%   BMI 30.38 kg/m   Physical Exam Physical Exam Vitals and nursing note reviewed. Exam conducted with a chaperone present.  Constitutional:      General: She is not in acute distress.    Appearance: Normal appearance.  Cardiovascular:     Rate and Rhythm: Normal rate and regular rhythm.     Heart sounds: No murmur heard. Pulmonary:     Effort: Pulmonary effort is normal.     Breath sounds: Normal breath sounds.  Abdominal:     General: Abdomen is flat. There is no distension.     Palpations: Abdomen is soft. There is no mass.     Tenderness: There is no abdominal tenderness. There is no guarding or rebound.     Hernia: No hernia is present.     Comments: Colostomy in place.  No clear  evidence of parastomal hernia on exam.  Wound Has completely healed  Musculoskeletal:     Cervical back: Normal range of motion and neck supple. No rigidity or tenderness.  Skin:    General: Skin is warm.     Capillary Refill: Capillary refill takes less than 2 seconds.  Neurological:     General: No focal deficit present.     Mental Status: She is alert.  Psychiatric:        Mood and Affect: Mood normal.        Behavior: Behavior normal.        Thought Content: Thought content normal.        Judgment: Judgment normal.      Assessment/Plan: 68 year old female status post Hartman for perforated diverticulitis.  She is doing very well.    I do think that at this time she is ready to move forward with colostomy takedown check.  We discussed in detail about her disease process and what this will entail.  The risks, the benefits and the possible complications including but not limited to: Bleeding, infection, injury to adjacent structures, possible anastomotic leak.  She is going to check with her family and daughters since she does have to arrange help given that her husband has dementia and requires significant assistance. I Did give her the option to have another discussion in a few weeks and at that time pin down a date  Please note that I spent 30 minutes in this encounter including review of medical records, review of imaging studies personally ,  Placing orders and performing documentation        Evelia Hipp, MD [facility] General Surgeon

## 2023-06-26 ENCOUNTER — Telehealth: Payer: Self-pay | Admitting: Surgery

## 2023-06-26 NOTE — Telephone Encounter (Signed)
 Outgoing call again to patient.  Left message for her to call the office so that we can discuss date for scheduling of her surgery with Dr. Dana Duncan for robotic asst colostomy takedown.  Patient has yet to return calls.

## 2023-06-27 ENCOUNTER — Telehealth: Payer: Self-pay | Admitting: Surgery

## 2023-06-27 NOTE — Telephone Encounter (Signed)
 Patient has been advised of Pre-Admission date/time, and Surgery date at [facility].  Surgery Date: 08/08/23 Preadmission Testing Date: 07/31/23 (phone 1p-4p)  Patient informed of the scheduling process, surgery information given at time of office visit.  Patient has been made aware to call [phone], between 1-3:00pm the day before surgery, to find out what time to arrive for surgery.

## 2023-07-11 ENCOUNTER — Telehealth: Payer: Self-pay | Admitting: Surgery

## 2023-07-11 NOTE — Telephone Encounter (Signed)
 Spoke with [facility] and they will fax over a new order request to us .

## 2023-07-11 NOTE — Telephone Encounter (Signed)
 Patient calls and she is having some trouble getting her ostomy supplies through [facility].  Last she spoke with them today and she was told that we need to send prescription for her supplies to them.  She is needing barium rings, bags, pouches, skin protection, wipes and lubricant.  Phone number for [facility] is [phone], Fax [phone].  Thank you.

## 2023-07-12 ENCOUNTER — Ambulatory Visit: Admitting: Surgery

## 2023-07-17 ENCOUNTER — Ambulatory Visit: Admitting: Surgery

## 2023-07-26 ENCOUNTER — Encounter: Payer: Self-pay | Admitting: Surgery

## 2023-07-26 ENCOUNTER — Ambulatory Visit: Admitting: Surgery

## 2023-07-26 VITALS — BP 140/81 | HR 80 | Ht 64.0 in | Wt 180.0 lb

## 2023-07-26 DIAGNOSIS — Z933 Colostomy status: Secondary | ICD-10-CM

## 2023-07-26 DIAGNOSIS — Z433 Encounter for attention to colostomy: Secondary | ICD-10-CM | POA: Diagnosis not present

## 2023-07-26 MED ORDER — BISACODYL 5 MG PO TBEC: DELAYED_RELEASE_TABLET | ORAL | 0 refills | Status: DC

## 2023-07-26 MED ORDER — METRONIDAZOLE 500 MG PO TABS: ORAL_TABLET | ORAL | 0 refills | Status: DC

## 2023-07-26 MED ORDER — POLYETHYLENE GLYCOL 3350 17 GM/SCOOP PO POWD: ORAL | 0 refills | Status: DC

## 2023-07-26 MED ORDER — NEOMYCIN SULFATE 500 MG PO TABS: ORAL_TABLET | ORAL | 0 refills | Status: DC

## 2023-07-26 NOTE — Patient Instructions (Addendum)
 We have spoken today about reversing your Ostomy. We have schedule this with Dr. Dana Duncan at [facility].   Plan on being in the hospital between 5-7 days after surgery. You will be started on a liquid diet and then advanced as tolerated prior to going home.  If you have any disability or FMLA paperwork that needs to be filled out for your employer, please bring this in prior to surgery or you may fax it to [phone].   To prep for your surgery, You will need to complete a bowel prep, 2 antibiotics, and a fleets enema prior to surgery. Your antibiotics are Neomycin  and Metronidazole and these will be taken at 8am, 2pm, 8pm on the day of your prep. (Please see the Bowel sheet provided)  Please see your [facility]) Pre-Care Sheet for more information. Our surgery scheduler will call you to review surgery information and let you know about your surgery date. If you have any questions, please call our office and ask for a nurse.  End Colostomy Reversal An end colostomy reversal is surgery that reverses an end colostomy. The large intestine is disconnected from the opening in the abdomen (stoma). Then it is reconnected to the large intestine inside the body. A stoma and pouch are no longer needed. Bowel movements can resume through the rectum. LET [facility] CARE PROVIDER KNOW ABOUT: Allergies to food or medicine. Medicines taken, including vitamins, health supplements, herbs, eye drops, over-the-counter medicines, and creams. Use of steroids (by mouth or creams). Previous problems with anesthetics or numbing medicines. History of bleeding problems or blood clots. Previous surgery. Other health problems, including diabetes and kidney problems. Possibility of pregnancy, if this applies. RISKS AND COMPLICATIONS General surgical complications may include the following: Reaction to anesthetics. Damage to surrounding nerves, tissues, or structures. Blood clot. Bleeding. Scarring. Specific risks for  colostomy reversal, while rare, may include: Intestinal paralysis (ileus). This is a normal part of recovery. It usually goes away in 3-7 days. However, it can last longer in some people. Leaking at the joined part of the intestine (anastomotic leak). Infection of the surgical cut (incision) or the place where the stoma was located. A collection of pus (abscess) in the abdomen or pelvis. Intestinal blockage. Narrowing at the joined part of the intestine (stricture). Urinary and sexual dysfunction. BEFORE THE PROCEDURE It is important to follow your health care provider's instructions prior to your procedure. This will help you to avoid complications. Steps before your procedure may include: A physical exam, rectal exam, X-rays, colonoscopy, and other procedures. Chemotherapy or radiation therapy, if the stoma was created due to cancer. A review of the procedure, the anesthetic being used, and what to expect after the procedure. You may be asked to: Stop taking certain medicines for several days prior to your procedure. These may include blood thinners (such as aspirin ). Take certain medicines, such as antibiotics or stool softeners. Avoid eating and drinking after midnight the night before the procedure. This will help you to avoid complications from the anesthetic. Quit smoking. Smoking increases the chances of a healing problem after your procedure. PROCEDURE You will be given medicine that makes you sleep (general anesthetic). The procedure may be done as open surgery, with a large incision. It may also be done as laparoscopic surgery, with several smaller incisions. The surgeon will stitch or staple the intestine ends back together. This surgery takes several hours. AFTER THE PROCEDURE You will be given pain medicine. Slowly increase your diet and movement as directed  by your health care provider. You should arrange for someone to help you with activities at home while you recover.    This information is not intended to replace advice given to you by your health care provider. Make sure you discuss any questions you have with your health care provider.   Document Released: 05/16/2011 Document Revised: 07/08/2014 Document Reviewed: 05/16/2011 Elsevier Interactive Patient Education [employer]

## 2023-07-27 ENCOUNTER — Encounter: Payer: Self-pay | Admitting: Surgery

## 2023-07-27 NOTE — Progress Notes (Signed)
 Outpatient Surgical Follow Up    Michele Sanders is an 68 y.o. female.   Chief Complaint  Patient presents with   Pre-op Exam    HPI:  Michele Sanders is 7 months out from [facility] procedure for perforated diverticulitis. She is doing very well. No fevers no chills. No major abdominal pain. Her colostomy is working well. She is ambulating and driving.  She did complete a CT of the abdomen and pelvis as well as a barium enema that I have personally reviewed showing no complicating features related to the colostomy or the [facility] pouch. She is here for surgical discussion.  We had an extensive discussion regarding her disease process.  I do think that there is a good chance that we will be able to do a colostomy takedown laparoscopically.  We talked about the risk the benefits and possible complications including but not limited to: Bleeding, infection anastomotic leak, prolonged hospitalization, bowel injuries.  Her daughter is cardiology nurse and now she has made arrangements for the family to stay here in town with her.  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  She denies any weight loss any fevers any chills or any evidence of obstruction  Past Medical History:  Diagnosis Date   Arthritis    GERD (gastroesophageal reflux disease)    History of kidney stones    Hyperparathyroidism (HCC)    Hypertension    Pneumonia    PONV (postoperative nausea and vomiting)    Pre-diabetes     Past Surgical History:  Procedure Laterality Date   APPENDECTOMY     BACK SURGERY     L4-L5   BREAST BIOPSY Left 04/09/2019   neg   BREAST CYST ASPIRATION Left 04/08/2019   neg   COLECTOMY WITH COLOSTOMY CREATION/HARTMANN PROCEDURE N/A 12/31/2022   Procedure: LAPAROTOMY WITH COLOSTOMY CREATION/HARTMANN PROCEDURE;  Surgeon: Alben Alma, MD;  Location: [facility] ORS;  Service: General;   Laterality: N/A;   COLONOSCOPY W/ POLYPECTOMY     CYSTOSCOPY/URETEROSCOPY/HOLMIUM LASER/STENT PLACEMENT Left 09/29/2020   Procedure: CYSTOSCOPY/URETEROSCOPY, /STENT PLACEMENT;  Surgeon: Geraline Knapp, MD;  Location: [facility] ORS;  Service: Urology;  Laterality: Left;   CYSTOSCOPY/URETEROSCOPY/HOLMIUM LASER/STENT PLACEMENT Left 11/10/2020   Procedure: CYSTOSCOPY/URETEROSCOPY/HOLMIUM LASER/STENT PLACEMENT;  Surgeon: Geraline Knapp, MD;  Location: [facility] ORS;  Service: Urology;  Laterality: Left;   FRACTURE SURGERY Right    humerus 2005   IR URETERAL STENT LEFT NEW ACCESS W/O SEP NEPHROSTOMY CATH  01/31/2022    Family History  Problem Relation Age of Onset   Breast cancer Neg Hx     Social History:  reports that she has quit smoking. Her smoking use included cigarettes. She has a 15 pack-year smoking history. She has been exposed to tobacco smoke. She has never used smokeless tobacco. She reports current alcohol use. She reports that she does not use drugs.  Allergies: No Known Allergies  Medications reviewed.    ROS Full ROS performed and is otherwise negative other than what is stated in HPI   BP (!) 140/81   Pulse 80   Ht 5\' 4"  (1.626 m)   Wt 180 lb (81.6 kg)   SpO2 98%   BMI 30.90 kg/m   Physical Exam  Physical Exam Physical Exam Vitals and nursing note reviewed. Exam conducted with a chaperone present.  Constitutional:      General: She  is not in acute distress.    Appearance: Normal appearance.  Cardiovascular:     Rate and Rhythm: Normal rate and regular rhythm.     Heart sounds: No murmur heard. Pulmonary:     Effort: Pulmonary effort is normal.     Breath sounds: Normal breath sounds.  Abdominal:     General: Abdomen is flat. There is no distension.     Palpations: Abdomen is soft. There is no mass.     Tenderness: There is no abdominal tenderness. There is no guarding or rebound.     Hernia: No hernia is present.     Comments: Colostomy in place.  No clear  evidence of parastomal hernia on exam.  Wound Has completely healed  Musculoskeletal:     Cervical back: Normal range of motion and neck supple. No rigidity or tenderness.  Skin:    General: Skin is warm.     Capillary Refill: Capillary refill takes less than 2 seconds.  Neurological:     General: No focal deficit present.     Mental Status: She is alert.  Psychiatric:        Mood and Affect: Mood normal.        Behavior: Behavior normal.        Thought Content: Thought content normal.        Judgment: Judgment normal.      Assessment/Plan: 68 year old female status post Hartman for perforated diverticulitis.  She is doing very well.   I do think that at this time she is ready to move forward with colostomy takedown check.  We discussed in detail about her disease process and what this will entail.  The risks, the benefits and the possible complications including but not limited to: Bleeding, infection, injury to adjacent structures, possible anastomotic leak.  Will plan to perform this surgery in a couple weeks.  Instructions for bowel prep antibiotics were given as well Please note that I spent 40 minutes in this encounter including extensive review of medical records, prior review of operative reports, personally reviewing imaging studies, coordinating his care, placing orders and performing documentation.  Evelia Hipp, MD [facility] General Surgeon

## 2023-07-27 NOTE — H&P (View-Only) (Signed)
 Outpatient Surgical Follow Up    Michele Sanders is an 68 y.o. female.   Chief Complaint  Patient presents with   Pre-op Exam    HPI:  Michele Sanders is 7 months out from [facility] procedure for perforated diverticulitis. She is doing very well. No fevers no chills. No major abdominal pain. Her colostomy is working well. She is ambulating and driving.  She did complete a CT of the abdomen and pelvis as well as a barium enema that I have personally reviewed showing no complicating features related to the colostomy or the [facility] pouch. She is here for surgical discussion.  We had an extensive discussion regarding her disease process.  I do think that there is a good chance that we will be able to do a colostomy takedown laparoscopically.  We talked about the risk the benefits and possible complications including but not limited to: Bleeding, infection anastomotic leak, prolonged hospitalization, bowel injuries.  Her daughter is cardiology nurse and now she has made arrangements for the family to stay here in town with her.  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  She denies any weight loss any fevers any chills or any evidence of obstruction  Past Medical History:  Diagnosis Date   Arthritis    GERD (gastroesophageal reflux disease)    History of kidney stones    Hyperparathyroidism (HCC)    Hypertension    Pneumonia    PONV (postoperative nausea and vomiting)    Pre-diabetes     Past Surgical History:  Procedure Laterality Date   APPENDECTOMY     BACK SURGERY     L4-L5   BREAST BIOPSY Left 04/09/2019   neg   BREAST CYST ASPIRATION Left 04/08/2019   neg   COLECTOMY WITH COLOSTOMY CREATION/HARTMANN PROCEDURE N/A 12/31/2022   Procedure: LAPAROTOMY WITH COLOSTOMY CREATION/HARTMANN PROCEDURE;  Surgeon: Alben Alma, MD;  Location: [facility] ORS;  Service: General;   Laterality: N/A;   COLONOSCOPY W/ POLYPECTOMY     CYSTOSCOPY/URETEROSCOPY/HOLMIUM LASER/STENT PLACEMENT Left 09/29/2020   Procedure: CYSTOSCOPY/URETEROSCOPY, /STENT PLACEMENT;  Surgeon: Geraline Knapp, MD;  Location: [facility] OR[facility];  Service: Urology;  Laterality: Left;   CYSTOSCOPY/URETEROSCOPY/HOLMIUM LASER/STENT PLACEMENT Left 11/10/2020   Procedure: CYSTOSCOPY/URETEROSCOPY/HOLMIUM LASER/STENT PLACEMENT;  Surgeon: Geraline Knapp, MD;  Location: [facility] ORS;  Service: Urology;  Laterality: Left;   FRACTURE SURGERY Right    humerus 2005   IR URETERAL STENT LEFT NEW ACCESS W/O SEP NEPHROSTOMY CATH  01/31/2022    Family History  Problem Relation Age of Onset   Breast cancer Neg Hx     Social History:  reports that she has quit smoking. Her smoking use included cigarettes. She has a 15 pack-year smoking history. She has been exposed to tobacco smoke. She has never used smokeless tobacco. She reports current alcohol use. She reports that she does not use drugs.  Allergies: No Known Allergies  Medications reviewed.    ROS Full ROS performed and is otherwise negative other than what is stated in HPI   BP (!) 140/81   Pulse 80   Ht 5\' 4"  (1.626 m)   Wt 180 lb (81.6 kg)   SpO2 98%   BMI 30.90 kg/m   Physical Exam  Physical Exam Physical Exam Vitals and nursing note reviewed. Exam conducted with a chaperone present.  Constitutional:      General: She  is not in acute distress.    Appearance: Normal appearance.  Cardiovascular:     Rate and Rhythm: Normal rate and regular rhythm.     Heart sounds: No murmur heard. Pulmonary:     Effort: Pulmonary effort is normal.     Breath sounds: Normal breath sounds.  Abdominal:     General: Abdomen is flat. There is no distension.     Palpations: Abdomen is soft. There is no mass.     Tenderness: There is no abdominal tenderness. There is no guarding or rebound.     Hernia: No hernia is present.     Comments: Colostomy in place.  No clear  evidence of parastomal hernia on exam.  Wound Has completely healed  Musculoskeletal:     Cervical back: Normal range of motion and neck supple. No rigidity or tenderness.  Skin:    General: Skin is warm.     Capillary Refill: Capillary refill takes less than 2 seconds.  Neurological:     General: No focal deficit present.     Mental Status: She is alert.  Psychiatric:        Mood and Affect: Mood normal.        Behavior: Behavior normal.        Thought Content: Thought content normal.        Judgment: Judgment normal.      Assessment/Plan: 68 year old female status post Hartman for perforated diverticulitis.  She is doing very well.   I do think that at this time she is ready to move forward with colostomy takedown check.  We discussed in detail about her disease process and what this will entail.  The risks, the benefits and the possible complications including but not limited to: Bleeding, infection, injury to adjacent structures, possible anastomotic leak.  Will plan to perform this surgery in a couple weeks.  Instructions for bowel prep antibiotics were given as well Please note that I spent 40 minutes in this encounter including extensive review of medical records, prior review of operative reports, personally reviewing imaging studies, coordinating his care, placing orders and performing documentation.  Evelia Hipp, MD [facility] General Surgeon

## 2023-07-31 ENCOUNTER — Inpatient Hospital Stay: Admission: RE | Admit: 2023-07-31 | Source: Ambulatory Visit

## 2023-08-01 ENCOUNTER — Other Ambulatory Visit: Payer: Self-pay

## 2023-08-01 ENCOUNTER — Encounter
Admission: RE | Admit: 2023-08-01 | Discharge: 2023-08-01 | Disposition: A | Discharge status: Home / Self Care | Source: Ambulatory Visit | Attending: Surgery | Admitting: Surgery

## 2023-08-01 DIAGNOSIS — Z01812 Encounter for preprocedural laboratory examination: Secondary | ICD-10-CM

## 2023-08-01 NOTE — Patient Instructions (Addendum)
 Your procedure is scheduled on: 08/08/23 - Tuesday Report to the Registration Desk on the 1st floor of the [facility]. To find out your arrival time, please call [phone] between 1PM - 3PM on: 08/07/23 - Monday If your arrival time is 6:00 am, do not arrive before that time as the [facility] entrance doors do not open until 6:00 am.  REMEMBER: Instructions that are not followed completely may result in serious medical risk, up to and including death; or upon the discretion of your surgeon and anesthesiologist your surgery may need to be rescheduled.  To prep for your surgery, You will need to complete a bowel prep, 2 antibiotics, and a fleets enema prior to surgery. Your antibiotics are Neomycin  and Metronidazole and these will be taken at 8am, 2pm, 8pm on the day of your prep. (Please see the Bowel sheet provided)   bisacodyl (DULCOLAX) - Take all 4 tablets at 8 am the morning prior to your surgery.   polyethylene glycol powder (MIRALAX) - Mix full container in 64 ounces of Gatorade or other clear liquid.NO Red   One week prior to surgery: Stop Anti-inflammatories (NSAIDS) such as Advil , Aleve, Ibuprofen , Motrin , Naproxen, Naprosyn and Aspirin  based products such as Excedrin, Goody's Powder, BC Powder. You may take Tylenol  if needed for pain up until the day of surgery.  Stop ANY OVER THE COUNTER supplements until after surgery : Vitamin D3   Patient reports last dose of Asa 81 mg beginning today, 08/01/23.  Continue taking all of your other prescription medications up until the day before surgery.  ON THE DAY OF SURGERY ONLY TAKE THESE MEDICATIONS WITH SIPS OF WATER :  pantoprazole  (PROTONIX )    Fleets enema or bowel prep as directed.  No Alcohol for 24 hours before or after surgery.  No Smoking including e-cigarettes for 24 hours before surgery.  No chewable tobacco products for at least 6 hours before surgery.  No nicotine patches on the day of surgery.  Do not use  any "recreational" drugs for at least a week (preferably 2 weeks) before your surgery.  Please be advised that the combination of cocaine and anesthesia may have negative outcomes, up to and including death. If you test positive for cocaine, your surgery will be cancelled.  On the morning of surgery brush your teeth with toothpaste and water , you may rinse your mouth with mouthwash if you wish. Do not swallow any toothpaste or mouthwash.  Use CHG Soap or wipes as directed on instruction sheet.  Do not wear jewelry, make-up, hairpins, clips or nail polish.  For welded (permanent) jewelry: bracelets, anklets, waist bands, etc.  Please have this removed prior to surgery.  If it is not removed, there is a chance that hospital personnel will need to cut it off on the day of surgery.  Do not wear lotions, powders, or perfumes.   Do not shave body hair from the neck down 48 hours before surgery.  Contact lenses, hearing aids and dentures may not be worn into surgery.  Do not bring valuables to the hospital. [facility] is not responsible for any missing/lost belongings or valuables.   Notify your doctor if there is any change in your medical condition (cold, fever, infection).  Wear comfortable clothing (specific to your surgery type) to the hospital.  After surgery, you can help prevent lung complications by doing breathing exercises.  Take deep breaths and cough every 1-2 hours. Your doctor may order a device called an Facilities manager to  help you take deep breaths.  When coughing or sneezing, hold a pillow firmly against your incision with both hands. This is called "splinting." Doing this helps protect your incision. It also decreases belly discomfort.  If you are being admitted to the hospital overnight, leave your suitcase in the car. After surgery it may be brought to your room.  In case of increased patient census, it may be necessary for you, the patient, to continue your  postoperative care in the Same Day Surgery department.  If you are being discharged the day of surgery, you will not be allowed to drive home. You will need a responsible individual to drive you home and stay with you for 24 hours after surgery.   If you are taking public transportation, you will need to have a responsible individual with you.  Please call the Pre-admissions Testing Dept. at [phone] if you have any questions about these instructions.  Surgery Visitation Policy:  Patients having surgery or a procedure may have two visitors.  Children under the age of 22 must have an adult with them who is not the patient.  Inpatient Visitation:    Visiting hours are 7 a.m. to 8 p.m. Up to four visitors are allowed at one time in a patient room. The visitors may rotate out with other people during the day.  One visitor age 78 or older may stay with the patient overnight and must be in the room by 8 p.m.     Preparing for Surgery with CHLORHEXIDINE  GLUCONATE (CHG) Soap  Chlorhexidine  Gluconate (CHG) Soap  o An antiseptic cleaner that kills germs and bonds with the skin to continue killing germs even after washing  o Used for showering the night before surgery and morning of surgery  Before surgery, you can play an important role by reducing the number of germs on your skin.  CHG (Chlorhexidine  gluconate) soap is an antiseptic cleanser which kills germs and bonds with the skin to continue killing germs even after washing.  Please do not use if you have an allergy to CHG or antibacterial soaps. If your skin becomes reddened/irritated stop using the CHG.  1. Shower the NIGHT BEFORE SURGERY and the MORNING OF SURGERY with CHG soap.  2. If you choose to wash your hair, wash your hair first as usual with your normal shampoo.  3. After shampooing, rinse your hair and body thoroughly to remove the shampoo.  4. Use CHG as you would any other liquid soap. You can apply CHG  directly to the skin and wash gently with a scrungie or a clean washcloth.  5. Apply the CHG soap to your body only from the neck down. Do not use on open wounds or open sores. Avoid contact with your eyes, ears, mouth, and genitals (private parts). Wash face and genitals (private parts) with your normal soap.  6. Wash thoroughly, paying special attention to the area where your surgery will be performed.  7. Thoroughly rinse your body with warm water .  8. Do not shower/wash with your normal soap after using and rinsing off the CHG soap.  9. Pat yourself dry with a clean towel.  10. Wear clean pajamas to bed the night before surgery.  12. Place clean sheets on your bed the night of your first shower and do not sleep with pets.  13. Shower again with the CHG soap on the day of surgery prior to arriving at the hospital.  14. Do not apply any deodorants/lotions/powders.  15. Please wear clean clothes to the hospital.  How to Use an Incentive Spirometer  An incentive spirometer is a tool that measures how well you are filling your lungs with each breath. Learning to take long, deep breaths using this tool can help you keep your lungs clear and active. This may help to reverse or lessen your chance of developing breathing (pulmonary) problems, especially infection. You may be asked to use a spirometer: After a surgery. If you have a lung problem or a history of smoking. After a long period of time when you have been unable to move or be active. If the spirometer includes an indicator to show the highest number that you have reached, your health care provider or respiratory therapist will help you set a goal. Keep a log of your progress as told by your health care provider. What are the risks? Breathing too quickly may cause dizziness or cause you to pass out. Take your time so you do not get dizzy or light-headed. If you are in pain, you may need to take pain medicine before doing incentive  spirometry. It is harder to take a deep breath if you are having pain.  How to use your incentive spirometer  Sit up on the edge of your bed or on a chair. Hold the incentive spirometer so that it is in an upright position. Before you use the spirometer, breathe out normally. Place the mouthpiece in your mouth. Make sure your lips are closed tightly around it. Breathe in slowly and as deeply as you can through your mouth, causing the piston or the ball to rise toward the top of the chamber. Hold your breath for 3-5 seconds, or for as long as possible. If the spirometer includes a coach indicator, use this to guide you in breathing. Slow down your breathing if the indicator goes above the marked areas. Remove the mouthpiece from your mouth and breathe out normally. The piston or ball will return to the bottom of the chamber. Rest for a few seconds, then repeat the steps 10 or more times. Take your time and take a few normal breaths between deep breaths so that you do not get dizzy or light-headed. Do this every 1-2 hours when you are awake. If the spirometer includes a goal marker to show the highest number you have reached (best effort), use this as a goal to work toward during each repetition. After each set of 10 deep breaths, cough a few times. This will help to make sure that your lungs are clear. If you have an incision on your chest or abdomen from surgery, place a pillow or a rolled-up towel firmly against the incision when you cough. This can help to reduce pain while taking deep breaths and coughing. General tips When you are able to get out of bed: Walk around often. Continue to take deep breaths and cough in order to clear your lungs. Keep using the incentive spirometer until your health care provider says it is okay to stop using it. If you have been in the hospital, you may be told to keep using the spirometer at home. Contact a health care provider if: You are having difficulty  using the spirometer. You have trouble using the spirometer as often as instructed. Your pain medicine is not giving enough relief for you to use the spirometer as told. You have a fever. Get help right away if: You develop shortness of breath. You develop a cough with bloody mucus  from the lungs. You have fluid or blood coming from an incision site after you cough. Summary An incentive spirometer is a tool that can help you learn to take long, deep breaths to keep your lungs clear and active. You may be asked to use a spirometer after a surgery, if you have a lung problem or a history of smoking, or if you have been inactive for a long period of time. Use your incentive spirometer as instructed every 1-2 hours while you are awake. If you have an incision on your chest or abdomen, place a pillow or a rolled-up towel firmly against your incision when you cough. This will help to reduce pain. Get help right away if you have shortness of breath, you cough up bloody mucus, or blood comes from your incision when you cough. This information is not intended to replace advice given to you by your health care provider. Make sure you discuss any questions you have with your health care provider. Document Revised: 05/13/2019 Document Reviewed: 05/13/2019 Elsevier Patient Education  2023 [employer].

## 2023-08-02 ENCOUNTER — Encounter
Admission: RE | Admit: 2023-08-02 | Discharge: 2023-08-02 | Disposition: A | Discharge status: Home / Self Care | Source: Ambulatory Visit | Attending: Surgery | Admitting: Surgery

## 2023-08-02 DIAGNOSIS — Z01818 Encounter for other preprocedural examination: Secondary | ICD-10-CM | POA: Diagnosis present

## 2023-08-02 DIAGNOSIS — Z01812 Encounter for preprocedural laboratory examination: Secondary | ICD-10-CM | POA: Diagnosis not present

## 2023-08-02 DIAGNOSIS — I10 Essential (primary) hypertension: Secondary | ICD-10-CM | POA: Diagnosis not present

## 2023-08-08 ENCOUNTER — Encounter
Admission: RE | Disposition: A | Discharge status: Home / Self Care | Payer: Self-pay | Source: Home / Self Care | Attending: Surgery

## 2023-08-08 ENCOUNTER — Inpatient Hospital Stay: Admitting: Anesthesiology

## 2023-08-08 ENCOUNTER — Inpatient Hospital Stay
Admission: RE | Admit: 2023-08-08 | Discharge: 2023-08-10 | DRG: 331 | Disposition: A | Discharge status: Home / Self Care | Attending: Surgery | Admitting: Surgery

## 2023-08-08 ENCOUNTER — Other Ambulatory Visit: Payer: Self-pay

## 2023-08-08 ENCOUNTER — Encounter: Payer: Self-pay | Admitting: Surgery

## 2023-08-08 DIAGNOSIS — Z433 Encounter for attention to colostomy: Secondary | ICD-10-CM | POA: Diagnosis not present

## 2023-08-08 DIAGNOSIS — Z87891 Personal history of nicotine dependence: Secondary | ICD-10-CM

## 2023-08-08 DIAGNOSIS — I10 Essential (primary) hypertension: Secondary | ICD-10-CM | POA: Diagnosis present

## 2023-08-08 DIAGNOSIS — Z9889 Other specified postprocedural states: Principal | ICD-10-CM

## 2023-08-08 DIAGNOSIS — K660 Peritoneal adhesions (postprocedural) (postinfection): Secondary | ICD-10-CM | POA: Diagnosis present

## 2023-08-08 DIAGNOSIS — K435 Parastomal hernia without obstruction or  gangrene: Secondary | ICD-10-CM

## 2023-08-08 DIAGNOSIS — D72829 Elevated white blood cell count, unspecified: Secondary | ICD-10-CM | POA: Diagnosis not present

## 2023-08-08 DIAGNOSIS — Z933 Colostomy status: Secondary | ICD-10-CM

## 2023-08-08 HISTORY — PX: COLOSTOMY TAKEDOWN: SHX5258

## 2023-08-08 SURGERY — CLOSURE, COLOSTOMY, LAPAROSCOPIC
Anesthesia: General

## 2023-08-08 MED ORDER — SODIUM CHLORIDE 0.9 % IV SOLN
INTRAVENOUS | Status: AC
  Filled 2023-08-08: qty 2

## 2023-08-08 MED ORDER — HEPARIN SODIUM (PORCINE) 5000 UNIT/ML IJ SOLN
5000.0000 [IU] | Freq: Once | INTRAMUSCULAR | Status: AC
  Administered 2023-08-08: 5000 [IU] via SUBCUTANEOUS

## 2023-08-08 MED ORDER — ALVIMOPAN 12 MG PO CAPS
12.0000 mg | ORAL_CAPSULE | ORAL | Status: AC
  Administered 2023-08-08: 12 mg via ORAL

## 2023-08-08 MED ORDER — ACETAMINOPHEN 10 MG/ML IV SOLN: 1000.0000 mg | Freq: Once | INTRAVENOUS | Status: DC | PRN

## 2023-08-08 MED ORDER — EPHEDRINE SULFATE-NACL 50-0.9 MG/10ML-% IV SOSY
PREFILLED_SYRINGE | INTRAVENOUS | Status: DC | PRN
  Administered 2023-08-08: 5 mg via INTRAVENOUS

## 2023-08-08 MED ORDER — HYDROMORPHONE HCL 1 MG/ML IJ SOLN
0.2500 mg | INTRAMUSCULAR | Status: DC | PRN
  Administered 2023-08-08 (×2): 0.25 mg via INTRAVENOUS
  Administered 2023-08-08: 0.5 mg via INTRAVENOUS

## 2023-08-08 MED ORDER — OXYCODONE HCL 5 MG PO TABS
5.0000 mg | ORAL_TABLET | ORAL | Status: DC | PRN
  Filled 2023-08-08: qty 2

## 2023-08-08 MED ORDER — HYDRALAZINE HCL 20 MG/ML IJ SOLN: 10.0000 mg | INTRAMUSCULAR | Status: DC | PRN

## 2023-08-08 MED ORDER — HYDROMORPHONE HCL 1 MG/ML IJ SOLN
INTRAMUSCULAR | Status: AC
  Filled 2023-08-08: qty 1

## 2023-08-08 MED ORDER — SODIUM CHLORIDE 0.9 % IV SOLN: INTRAVENOUS | Status: AC

## 2023-08-08 MED ORDER — LIDOCAINE HCL (PF) 2 % IJ SOLN
INTRAMUSCULAR | Status: AC
  Filled 2023-08-08: qty 5

## 2023-08-08 MED ORDER — BUPIVACAINE-EPINEPHRINE (PF) 0.25% -1:200000 IJ SOLN
INTRAMUSCULAR | Status: AC
  Filled 2023-08-08: qty 30

## 2023-08-08 MED ORDER — PROCHLORPERAZINE EDISYLATE 10 MG/2ML IJ SOLN: 5.0000 mg | Freq: Four times a day (QID) | INTRAMUSCULAR | Status: DC | PRN

## 2023-08-08 MED ORDER — ONDANSETRON HCL 4 MG/2ML IJ SOLN
INTRAMUSCULAR | Status: AC
  Filled 2023-08-08: qty 2

## 2023-08-08 MED ORDER — GABAPENTIN 300 MG PO CAPS
ORAL_CAPSULE | ORAL | Status: AC
  Filled 2023-08-08: qty 1

## 2023-08-08 MED ORDER — ACETAMINOPHEN 500 MG PO TABS
ORAL_TABLET | ORAL | Status: AC
  Filled 2023-08-08: qty 2

## 2023-08-08 MED ORDER — SODIUM CHLORIDE 0.9 % IV SOLN
2.0000 g | INTRAVENOUS | Status: AC
  Administered 2023-08-08: 2 g via INTRAVENOUS

## 2023-08-08 MED ORDER — ONDANSETRON HCL 4 MG/2ML IJ SOLN: 4.0000 mg | Freq: Four times a day (QID) | INTRAMUSCULAR | Status: DC | PRN

## 2023-08-08 MED ORDER — PROCHLORPERAZINE MALEATE 10 MG PO TABS: 10.0000 mg | ORAL_TABLET | Freq: Four times a day (QID) | ORAL | Status: DC | PRN

## 2023-08-08 MED ORDER — DROPERIDOL 2.5 MG/ML IJ SOLN: 0.6250 mg | Freq: Once | INTRAMUSCULAR | Status: DC | PRN

## 2023-08-08 MED ORDER — LACTATED RINGERS IV SOLN: INTRAVENOUS | Status: DC

## 2023-08-08 MED ORDER — CELECOXIB 200 MG PO CAPS
200.0000 mg | ORAL_CAPSULE | ORAL | Status: AC
  Administered 2023-08-08: 200 mg via ORAL

## 2023-08-08 MED ORDER — MORPHINE SULFATE (PF) 2 MG/ML IV SOLN: 2.0000 mg | INTRAVENOUS | Status: DC | PRN

## 2023-08-08 MED ORDER — DEXAMETHASONE SODIUM PHOSPHATE 10 MG/ML IJ SOLN
INTRAMUSCULAR | Status: AC
  Filled 2023-08-08: qty 1

## 2023-08-08 MED ORDER — SODIUM CHLORIDE (PF) 0.9 % IJ SOLN
INTRAMUSCULAR | Status: AC
  Filled 2023-08-08: qty 50

## 2023-08-08 MED ORDER — MELATONIN 5 MG PO TABS
2.5000 mg | ORAL_TABLET | Freq: Every evening | ORAL | Status: DC | PRN
  Filled 2023-08-08: qty 1

## 2023-08-08 MED ORDER — SODIUM CHLORIDE 0.9 % IV SOLN
2.0000 g | Freq: Three times a day (TID) | INTRAVENOUS | Status: AC
  Administered 2023-08-08 – 2023-08-09 (×3): 2 g via INTRAVENOUS
  Filled 2023-08-08 (×3): qty 2

## 2023-08-08 MED ORDER — PROPOFOL 10 MG/ML IV BOLUS
INTRAVENOUS | Status: DC | PRN
Start: 2023-08-08 — End: 2023-08-08
  Administered 2023-08-08: 150 mg via INTRAVENOUS
  Administered 2023-08-08: 25 ug/kg/min via INTRAVENOUS

## 2023-08-08 MED ORDER — SUGAMMADEX SODIUM 200 MG/2ML IV SOLN
INTRAVENOUS | Status: DC | PRN
  Administered 2023-08-08: 200 mg via INTRAVENOUS

## 2023-08-08 MED ORDER — DEXAMETHASONE SODIUM PHOSPHATE 10 MG/ML IJ SOLN
INTRAMUSCULAR | Status: DC | PRN
  Administered 2023-08-08: 10 mg via INTRAVENOUS

## 2023-08-08 MED ORDER — CHLORHEXIDINE GLUCONATE 0.12 % MT SOLN
15.0000 mL | Freq: Once | OROMUCOSAL | Status: AC
Start: 2023-08-08 — End: 2023-08-08
  Administered 2023-08-08: 15 mL via OROMUCOSAL

## 2023-08-08 MED ORDER — BUPIVACAINE LIPOSOME 1.3 % IJ SUSP
INTRAMUSCULAR | Status: AC
Start: 2023-08-08 — End: ?
  Filled 2023-08-08: qty 20

## 2023-08-08 MED ORDER — FENTANYL CITRATE (PF) 100 MCG/2ML IJ SOLN
INTRAMUSCULAR | Status: AC
  Filled 2023-08-08: qty 2

## 2023-08-08 MED ORDER — MIDAZOLAM HCL 2 MG/2ML IJ SOLN
INTRAMUSCULAR | Status: AC
  Filled 2023-08-08: qty 2

## 2023-08-08 MED ORDER — METHOCARBAMOL 500 MG PO TABS
500.0000 mg | ORAL_TABLET | Freq: Three times a day (TID) | ORAL | Status: DC | PRN
  Filled 2023-08-08: qty 1

## 2023-08-08 MED ORDER — LISINOPRIL 20 MG PO TABS
20.0000 mg | ORAL_TABLET | Freq: Every day | ORAL | Status: DC
  Administered 2023-08-09 – 2023-08-10 (×2): 20 mg via ORAL
  Filled 2023-08-08 (×2): qty 1

## 2023-08-08 MED ORDER — ALVIMOPAN 12 MG PO CAPS
ORAL_CAPSULE | ORAL | Status: AC
  Filled 2023-08-08: qty 1

## 2023-08-08 MED ORDER — FENTANYL CITRATE (PF) 100 MCG/2ML IJ SOLN
INTRAMUSCULAR | Status: DC | PRN
  Administered 2023-08-08 (×2): 50 ug via INTRAVENOUS

## 2023-08-08 MED ORDER — SODIUM CHLORIDE (PF) 0.9 % IJ SOLN
INTRAMUSCULAR | Status: DC | PRN
  Administered 2023-08-08: 100 mL via INTRAMUSCULAR

## 2023-08-08 MED ORDER — ONDANSETRON 4 MG PO TBDP: 4.0000 mg | ORAL_TABLET | Freq: Four times a day (QID) | ORAL | Status: DC | PRN

## 2023-08-08 MED ORDER — EPHEDRINE 5 MG/ML INJ
INTRAVENOUS | Status: AC
Start: 2023-08-08 — End: ?
  Filled 2023-08-08: qty 5

## 2023-08-08 MED ORDER — MIDAZOLAM HCL 2 MG/2ML IJ SOLN
INTRAMUSCULAR | Status: DC | PRN
  Administered 2023-08-08: 2 mg via INTRAVENOUS

## 2023-08-08 MED ORDER — PROPOFOL 10 MG/ML IV BOLUS
INTRAVENOUS | Status: AC
  Filled 2023-08-08: qty 40

## 2023-08-08 MED ORDER — ENOXAPARIN SODIUM 40 MG/0.4ML IJ SOSY
40.0000 mg | PREFILLED_SYRINGE | INTRAMUSCULAR | Status: DC
  Administered 2023-08-09: 40 mg via SUBCUTANEOUS
  Filled 2023-08-08: qty 0.4

## 2023-08-08 MED ORDER — PHENYLEPHRINE 80 MCG/ML (10ML) SYRINGE FOR IV PUSH (FOR BLOOD PRESSURE SUPPORT)
PREFILLED_SYRINGE | INTRAVENOUS | Status: DC | PRN
  Administered 2023-08-08 (×2): 80 ug via INTRAVENOUS

## 2023-08-08 MED ORDER — ROCURONIUM BROMIDE 100 MG/10ML IV SOLN
INTRAVENOUS | Status: DC | PRN
  Administered 2023-08-08: 50 mg via INTRAVENOUS
  Administered 2023-08-08: 20 mg via INTRAVENOUS

## 2023-08-08 MED ORDER — KETOROLAC TROMETHAMINE 15 MG/ML IJ SOLN
15.0000 mg | Freq: Four times a day (QID) | INTRAMUSCULAR | Status: DC
  Administered 2023-08-08 – 2023-08-10 (×8): 15 mg via INTRAVENOUS
  Filled 2023-08-08 (×7): qty 1

## 2023-08-08 MED ORDER — ROCURONIUM BROMIDE 10 MG/ML (PF) SYRINGE
PREFILLED_SYRINGE | INTRAVENOUS | Status: AC
  Filled 2023-08-08: qty 10

## 2023-08-08 MED ORDER — HEPARIN SODIUM (PORCINE) 5000 UNIT/ML IJ SOLN
INTRAMUSCULAR | Status: AC
  Filled 2023-08-08: qty 1

## 2023-08-08 MED ORDER — KETOROLAC TROMETHAMINE 15 MG/ML IJ SOLN
INTRAMUSCULAR | Status: AC
Start: 2023-08-08 — End: ?
  Filled 2023-08-08: qty 1

## 2023-08-08 MED ORDER — DEXMEDETOMIDINE HCL IN NACL 80 MCG/20ML IV SOLN
INTRAVENOUS | Status: DC | PRN
  Administered 2023-08-08: 4 ug via INTRAVENOUS
  Administered 2023-08-08: 8 ug via INTRAVENOUS

## 2023-08-08 MED ORDER — CHLORHEXIDINE GLUCONATE 0.12 % MT SOLN
OROMUCOSAL | Status: AC
  Filled 2023-08-08: qty 15

## 2023-08-08 MED ORDER — CHLORHEXIDINE GLUCONATE CLOTH 2 % EX PADS
6.0000 | MEDICATED_PAD | Freq: Once | CUTANEOUS | Status: AC
  Administered 2023-08-08: 6 via TOPICAL

## 2023-08-08 MED ORDER — ACETAMINOPHEN 500 MG PO TABS
1000.0000 mg | ORAL_TABLET | Freq: Four times a day (QID) | ORAL | Status: DC
  Administered 2023-08-08 – 2023-08-10 (×6): 1000 mg via ORAL
  Filled 2023-08-08 (×7): qty 2

## 2023-08-08 MED ORDER — CHLORHEXIDINE GLUCONATE CLOTH 2 % EX PADS: 6.0000 | MEDICATED_PAD | Freq: Once | CUTANEOUS | Status: DC

## 2023-08-08 MED ORDER — PHENYLEPHRINE 80 MCG/ML (10ML) SYRINGE FOR IV PUSH (FOR BLOOD PRESSURE SUPPORT)
PREFILLED_SYRINGE | INTRAVENOUS | Status: AC
Start: 2023-08-08 — End: ?
  Filled 2023-08-08: qty 10

## 2023-08-08 MED ORDER — GABAPENTIN 300 MG PO CAPS
300.0000 mg | ORAL_CAPSULE | ORAL | Status: AC
  Administered 2023-08-08: 300 mg via ORAL

## 2023-08-08 MED ORDER — CELECOXIB 200 MG PO CAPS
ORAL_CAPSULE | ORAL | Status: AC
  Filled 2023-08-08: qty 1

## 2023-08-08 MED ORDER — METHOCARBAMOL 1000 MG/10ML IJ SOLN: 500.0000 mg | Freq: Three times a day (TID) | INTRAMUSCULAR | Status: DC | PRN

## 2023-08-08 MED ORDER — ONDANSETRON HCL 4 MG/2ML IJ SOLN
INTRAMUSCULAR | Status: DC | PRN
  Administered 2023-08-08: 4 mg via INTRAVENOUS

## 2023-08-08 MED ORDER — ALVIMOPAN 12 MG PO CAPS
12.0000 mg | ORAL_CAPSULE | Freq: Every day | ORAL | Status: DC
  Administered 2023-08-09: 12 mg via ORAL
  Filled 2023-08-08 (×2): qty 1

## 2023-08-08 MED ORDER — PANTOPRAZOLE SODIUM 40 MG IV SOLR
40.0000 mg | Freq: Every day | INTRAVENOUS | Status: DC
  Administered 2023-08-08 – 2023-08-09 (×2): 40 mg via INTRAVENOUS
  Filled 2023-08-08 (×2): qty 10

## 2023-08-08 MED ORDER — ORAL CARE MOUTH RINSE
15.0000 mL | Freq: Once | OROMUCOSAL | Status: AC
Start: 2023-08-08 — End: 2023-08-08

## 2023-08-08 MED ORDER — DIPHENHYDRAMINE HCL 12.5 MG/5ML PO ELIX: 12.5000 mg | ORAL_SOLUTION | Freq: Four times a day (QID) | ORAL | Status: DC | PRN

## 2023-08-08 MED ORDER — ACETAMINOPHEN 500 MG PO TABS
1000.0000 mg | ORAL_TABLET | ORAL | Status: AC
  Administered 2023-08-08: 1000 mg via ORAL

## 2023-08-08 MED ORDER — DIPHENHYDRAMINE HCL 50 MG/ML IJ SOLN: 12.5000 mg | Freq: Four times a day (QID) | INTRAMUSCULAR | Status: DC | PRN

## 2023-08-08 SURGICAL SUPPLY — 73 items
ADHESIVE MASTISOL STRL (MISCELLANEOUS) IMPLANT
BAG DECANTER FOR FLEXI CONT (MISCELLANEOUS) ×1 IMPLANT
BARRIER ADH SEPRAFILM 3INX5IN (MISCELLANEOUS) IMPLANT
BLADE CLIPPER SURG (BLADE) ×1 IMPLANT
BLADE SURG SZ10 CARB STEEL (BLADE) ×1 IMPLANT
BLADE SURG SZ11 CARB STEEL (BLADE) ×1 IMPLANT
CATH ROBINSON RED A/P 14FR (CATHETERS) ×1 IMPLANT
CLIP APPLIE 13 LRG OPEN (CLIP) ×1 IMPLANT
DERMABOND ADVANCED .7 DNX12 (GAUZE/BANDAGES/DRESSINGS) ×2 IMPLANT
DRAIN CHANNEL JP 19F RND 3/16 (MISCELLANEOUS) IMPLANT
DRAPE INCISE IOBAN 66X45 STRL (DRAPES) ×1 IMPLANT
DRAPE LEGGINS SURG 28X43 STRL (DRAPES) ×1 IMPLANT
DRAPE UNDER BUTTOCK W/FLU (DRAPES) ×1 IMPLANT
DRSG OPSITE POSTOP 3X4 (GAUZE/BANDAGES/DRESSINGS) IMPLANT
DRSG OPSITE POSTOP 4X6 (GAUZE/BANDAGES/DRESSINGS) IMPLANT
ELECT BLADE 6.5 EXT (BLADE) ×1 IMPLANT
ELECTRODE CAUTERY BLDE TIP 2.5 (TIP) ×1 IMPLANT
ELECTRODE REM PT RTRN 9FT ADLT (ELECTROSURGICAL) ×1 IMPLANT
EVACUATOR DRAINAGE 10X20 100CC (DRAIN) ×1 IMPLANT
EVACUATOR SILICONE 100CC (DRAIN) IMPLANT
GAUZE SPONGE 4X4 12PLY STRL (GAUZE/BANDAGES/DRESSINGS) ×1 IMPLANT
GLOVE BIO SURGEON STRL SZ7 (GLOVE) ×2 IMPLANT
GOWN STRL REUS W/ TWL LRG LVL3 (GOWN DISPOSABLE) ×4 IMPLANT
HANDLE SUCTION POOLE (INSTRUMENTS) IMPLANT
HANDLE YANKAUER SUCT BULB TIP (MISCELLANEOUS) ×1 IMPLANT
IRRIGATION STRYKERFLOW (MISCELLANEOUS) ×1 IMPLANT
IV NS 1000ML BAXH (IV SOLUTION) ×1 IMPLANT
KIT PINK PAD W/HEAD ARE REST (MISCELLANEOUS) ×1 IMPLANT
KIT PINK PAD W/HEAD ARM REST (MISCELLANEOUS) ×1 IMPLANT
LHOOK LAP DISP 36CM (ELECTROSURGICAL) ×1 IMPLANT
MANIFOLD NEPTUNE II (INSTRUMENTS) ×1 IMPLANT
MARKER SKIN DUAL TIP RULER LAB (MISCELLANEOUS) ×1 IMPLANT
NDL HYPO 22X1.5 SAFETY MO (MISCELLANEOUS) ×1 IMPLANT
NEEDLE HYPO 22X1.5 SAFETY MO (MISCELLANEOUS) ×1 IMPLANT
NS IRRIG 1000ML POUR BTL (IV SOLUTION) ×1 IMPLANT
PACK COLON CLEAN CLOSURE (MISCELLANEOUS) ×1 IMPLANT
PACK LAP CHOLECYSTECTOMY (MISCELLANEOUS) ×1 IMPLANT
RELOAD STAPLE 60 2.6 WHT THN (STAPLE) ×2 IMPLANT
RELOAD STAPLE 60 3.6 BLU REG (STAPLE) IMPLANT
RELOAD STAPLER BLUE 60MM (STAPLE) IMPLANT
RELOAD STAPLER WHITE 60MM (STAPLE) IMPLANT
SCISSORS METZENBAUM CVD 33 (INSTRUMENTS) ×1 IMPLANT
SET TUBE SMOKE EVAC HIGH FLOW (TUBING) ×1 IMPLANT
SHEARS HARMONIC 36 ACE (MISCELLANEOUS) ×1 IMPLANT
SLEEVE Z-THREAD 5X100MM (TROCAR) ×1 IMPLANT
SOLUTION PREP PVP 2OZ (MISCELLANEOUS) ×1 IMPLANT
SPONGE DRAIN TRACH 4X4 STRL 2S (GAUZE/BANDAGES/DRESSINGS) IMPLANT
SPONGE T-LAP 18X18 ~~LOC~~+RFID (SPONGE) ×3 IMPLANT
STAPLE ECHEON FLEX 60 POW ENDO (STAPLE) IMPLANT
STAPLER CIRCULAR MANUAL XL 25 (STAPLE) IMPLANT
STAPLER CIRCULAR MANUAL XL 29 (STAPLE) IMPLANT
STAPLER CIRCULAR MANUAL XL 33 (STAPLE) IMPLANT
STAPLER SKIN PROX 35W (STAPLE) ×1 IMPLANT
SUCT SIGMOIDOSCOPE TIP 18 W/TU (SUCTIONS) IMPLANT
SUT ETHIBOND 0 MO6 C/R (SUTURE) IMPLANT
SUT MNCRL AB 4-0 PS2 18 (SUTURE) ×2 IMPLANT
SUT PDS AB 0 CT1 27 (SUTURE) ×2 IMPLANT
SUT SILK 2 0SH CR/8 30 (SUTURE) ×1 IMPLANT
SUT SILK 2-0 (SUTURE) ×1 IMPLANT
SUT SILK 2-0 18XBRD TIE 12 (SUTURE) ×1 IMPLANT
SUT SILK 3 0 SH CR/8 (SUTURE) ×1 IMPLANT
SUT VIC AB 2-0 SH 27XBRD (SUTURE) ×2 IMPLANT
SUTURE EHLN 3-0 FS-10 30 BLK (SUTURE) IMPLANT
SYR 20ML LL LF (SYRINGE) ×2 IMPLANT
SYR 50ML LL SCALE MARK (SYRINGE) ×1 IMPLANT
SYRINGE TOOMEY IRRIG 70ML (MISCELLANEOUS) ×1 IMPLANT
SYSTEM LAPSCP GELPORT 120MM (MISCELLANEOUS) ×1 IMPLANT
TOWEL OR 17X26 4PK STRL BLUE (TOWEL DISPOSABLE) ×1 IMPLANT
TRAP FLUID SMOKE EVACUATOR (MISCELLANEOUS) ×1 IMPLANT
TRAY FOLEY MTR SLVR 16FR STAT (SET/KITS/TRAYS/PACK) ×1 IMPLANT
TROCAR Z-THREAD FIOS 12X100MM (TROCAR) ×1 IMPLANT
TROCAR Z-THREAD FIOS 5X100MM (TROCAR) ×1 IMPLANT
WATER STERILE IRR 500ML POUR (IV SOLUTION) ×1 IMPLANT

## 2023-08-08 NOTE — Anesthesia Procedure Notes (Signed)
 Procedure Name: Intubation Date/Time: 08/08/2023 8:29 AM  Performed by: Juanda Noon, CRNAPre-anesthesia Checklist: Patient identified, Patient being monitored, Timeout performed, Emergency Drugs available and Suction available Patient Re-evaluated:Patient Re-evaluated prior to induction Oxygen Delivery Method: Circle system utilized Preoxygenation: Pre-oxygenation with 100% oxygen Induction Type: IV induction Ventilation: Mask ventilation without difficulty Laryngoscope Size: Mac, 3 and McGrath Grade View: Grade I Tube type: Oral Tube size: 7.0 mm Number of attempts: 1 Airway Equipment and Method: Stylet Placement Confirmation: ETT inserted through vocal cords under direct vision, positive ETCO2 and breath sounds checked- equal and bilateral Secured at: 22 cm Tube secured with: Tape Dental Injury: Teeth and Oropharynx as per pre-operative assessment

## 2023-08-08 NOTE — Interval H&P Note (Signed)
 History and Physical Interval Note:  08/08/2023 7:16 AM  Michele Sanders  has presented today for surgery, with the diagnosis of Colostomy care.  The various methods of treatment have been discussed with the patient and family. After consideration of risks, benefits and other options for treatment, the patient has consented to  Procedure(s) with comments: CLOSURE, COLOSTOMY, LAPAROSCOPIC (N/A) - HAND ASSISTED as a surgical intervention.  The patient's history has been reviewed, patient examined, no change in status, stable for surgery.  I have reviewed the patient's chart and labs.  Questions were answered to the patient's satisfaction.     Charlestine Rookstool F Mavery Milling

## 2023-08-08 NOTE — Op Note (Signed)
 PROCEDURES: 1. Laparoscopic lysis of adhesions 2. Laparoscopic takedown of hartmann's EEA stapled anastomosis 3. Repair parastomal hernia   Pre-operative Diagnosis:Hx hartmann's procedure   Post-operative Diagnosis: same   Surgeon: Marcial Setting Michele Sanders    Anesthesia: General endotracheal anesthesia   ASA Class: 3    Surgeon: Evelia Hipp , MD FACS   Anesthesia: Gen. with endotracheal tube   Assistant:Michele Sanders   Findings: Extensive adhesions from the small bowel to pelvic wall, small bowel to colon and small bowel to small bowel Parastomal hernia 3cm Tension free anastomosis with excellent perfusion, no evidence of anastomotic leak intra-op and two intact doughnuts     Estimated Blood Loss: 25cc         Specimens: End colostomy          Complications: none         Condition: stable   Procedure Details  The patient was seen again in the Holding Room. The benefits, complications, treatment options, and expected outcomes were discussed with the patient. The risks of bleeding, infection, recurrence of symptoms, failure to resolve symptoms,  bowel injury, any of which could require further surgery were reviewed with the patient.   The patient was taken to Operating Room, identified  and the procedure verified.  A Time Out was held and the above information confirmed.   Prior to the induction of general anesthesia, antibiotic prophylaxis was administered. VTE prophylaxis was in place. General endotracheal anesthesia was then administered and tolerated well. After the induction,  I placed a pursestring suture to tie off the end colostomy, the abdomen was prepped  draped in the sterile fashion. The patient was positioned in lithotomy position. 7 cm incision was created as a left lower quadrant mini laparotomy incorporating the ned colostomy. The colostomy was dissected free from the sub q, a parastomal hernia measuring 3 cms was observed, sac reduced.  The abdominal cavity was  entered under direct visualization and the GelPort device was placed. two 5 mm port were placed under direct visualization and pneumoperitoneum was obtained. There were dense adhesions from the omentum to the abdominal wall that where lysed in the standard fashion with the Harmonic scalpel.There was significant adhesive disease in the pelvis from the sigmoid to the pelvic wall and also from the small bowel to the pelvic wall and interloop adhesions. These adhesions were lysed with a combination of finger fracturing and scissors. The white line of Toldt was identified and divided and we mobilized the descending colon IN a lateral to medial fashion using harmonic We preserved the ureter at all times.  We removed the GelPort and visualized the small bowel and  colon in a direct fashion. No evidence of bowel injuries were encountered. We opened and measured the diameter of the descending colon. A 25 mm dilator was perfect size. A pursestring was used after inserting the anvil device. Mr. Daniel Sanders was able to pass a 25 mm standard EEA stapler device through the anus and  Under direct visualization we performed an end to end anastomosis with the EEA device. Please note that there was a section of the distal rectum that we could not transverse and the eea had to be deployed more distal to this area., There was a distal piece of rectum that was kinked and we were unable to traverse the kink.  A leak test was performed inflating the colon with a Toomey syringe and a rubber catheter. No evidence of leak was observed. There was also adequate hemostasis. Michele Sanders  All the laparoscopic ports were removed and a second look showed no evidence of any bleeding or any other injuries. We changed gloves and place a new tray to close.   Liposomal Marcaine   was injected on all incision sites under direct visualization as bilateral TAP blocks. The hernia defect was closed in a two layer fashion using interrupted 0 ethibonds  approximating the anterior and posterior rectus sheaths. Wound irrigated and skin incisions closed w staples. Sterile dressing placed. Please note that Mr Michele Sanders was required due to the complexity of the case, for exposure, creation of anastomosis and closure. Needle and laparotomy count were correct and there were no immediate complications   Evelia Hipp, MD, FACS

## 2023-08-08 NOTE — Anesthesia Preprocedure Evaluation (Addendum)
 Anesthesia Evaluation  Patient identified by MRN, date of birth, ID band Patient awake    Reviewed: Allergy & Precautions, H&P , NPO status , Patient's Chart, lab work & pertinent test results  History of Anesthesia Complications (+) PONV and history of anesthetic complications  Airway Mallampati: III  TM Distance: >3 FB Neck ROM: full    Dental no notable dental hx.    Pulmonary Current Smoker and Patient abstained from smoking.   Pulmonary exam normal        Cardiovascular hypertension, Normal cardiovascular exam     Neuro/Psych negative neurological ROS  negative psych ROS   GI/Hepatic Neg liver ROS,GERD  ,,7 months out from Hartman's procedure for perforated diverticuliti   Endo/Other  negative endocrine ROS    Renal/GU      Musculoskeletal   Abdominal   Peds  Hematology negative hematology ROS (+)   Anesthesia Other Findings Past Medical History: No date: Arthritis No date: GERD (gastroesophageal reflux disease) No date: History of kidney stones No date: Hyperparathyroidism (HCC) No date: Hypertension No date: Pneumonia No date: PONV (postoperative nausea and vomiting) No date: Pre-diabetes  Past Surgical History: No date: APPENDECTOMY No date: BACK SURGERY     Comment:  L4-L5 04/09/2019: BREAST BIOPSY; Left     Comment:  neg 04/08/2019: BREAST CYST ASPIRATION; Left     Comment:  neg 12/31/2022: COLECTOMY WITH COLOSTOMY CREATION/HARTMANN PROCEDURE; N/A     Comment:  Procedure: LAPAROTOMY WITH COLOSTOMY CREATION/HARTMANN               PROCEDURE;  Surgeon: Alben Alma, MD;  Location: [facility]               ORS;  Service: General;  Laterality: N/A; No date: COLONOSCOPY W/ POLYPECTOMY 09/29/2020: CYSTOSCOPY/URETEROSCOPY/HOLMIUM LASER/STENT PLACEMENT; Left     Comment:  Procedure: CYSTOSCOPY/URETEROSCOPY, /STENT PLACEMENT;                Surgeon: Geraline Knapp, MD;  Location: [facility] ORS;                 Service: Urology;  Laterality: Left; 11/10/2020: CYSTOSCOPY/URETEROSCOPY/HOLMIUM LASER/STENT PLACEMENT; Left     Comment:  Procedure: CYSTOSCOPY/URETEROSCOPY/HOLMIUM LASER/STENT               PLACEMENT;  Surgeon: Geraline Knapp, MD;  Location:               [facility] ORS;  Service: Urology;  Laterality: Left; No date: FRACTURE SURGERY; Right     Comment:  humerus 2005 01/31/2022: IR URETERAL STENT LEFT NEW ACCESS W/O SEP NEPHROSTOMY CATH     Reproductive/Obstetrics negative OB ROS                             Anesthesia Physical Anesthesia Plan  ASA: 2  Anesthesia Plan: General ETT   Post-op Pain Management: Tylenol  PO (pre-op)*, Celebrex PO (pre-op)* and Gabapentin PO (pre-op)*   Induction: Intravenous  PONV Risk Score and Plan: 2 and Ondansetron , Dexamethasone  and Midazolam   Airway Management Planned: Oral ETT  Additional Equipment:   Intra-op Plan:   Post-operative Plan: Extubation in OR  Informed Consent: I have reviewed the patients History and Physical, chart, labs and discussed the procedure including the risks, benefits and alternatives for the proposed anesthesia with the patient or authorized representative who has indicated his/her understanding and acceptance.     Dental Advisory Given  Plan Discussed with: CRNA and Surgeon  Anesthesia Plan Comments:         Anesthesia Quick Evaluation

## 2023-08-08 NOTE — Transfer of Care (Signed)
 Immediate Anesthesia Transfer of Care Note  Patient: Michele Sanders  Procedure(s) Performed: CLOSURE, COLOSTOMY, LAPAROSCOPIC  Patient Location: PACU  Anesthesia Type:General  Level of Consciousness: awake, alert , and oriented  Airway & Oxygen Therapy: Patient Spontanous Breathing  Post-op Assessment: Report given to RN and Post -op Vital signs reviewed and stable  Post vital signs: Reviewed and stable  Last Vitals:  Vitals Value Taken Time  BP 126/59 08/08/23 1117  Temp    Pulse 85 08/08/23 1121  Resp 19 08/08/23 1121  SpO2 100 % 08/08/23 1121  Vitals shown include unfiled device data.  Last Pain:  Vitals:   08/08/23 0645  TempSrc:   PainSc: 0-No pain         Complications: No notable events documented.

## 2023-08-09 ENCOUNTER — Encounter: Payer: Self-pay | Admitting: Surgery

## 2023-08-09 LAB — BASIC METABOLIC PANEL WITH GFR
Anion gap: 6 (ref 5–15)
BUN: 11 mg/dL (ref 8–23)
CO2: 25 mmol/L (ref 22–32)
Calcium: 8.3 mg/dL — ABNORMAL LOW (ref 8.9–10.3)
Chloride: 107 mmol/L (ref 98–111)
Creatinine, Ser: 0.87 mg/dL (ref 0.44–1.00)
GFR, Estimated: >60 mL/min (ref >60)
Glucose, Bld: 116 mg/dL — ABNORMAL HIGH (ref 70–99)
Potassium: 3.7 mmol/L (ref 3.5–5.1)
Sodium: 138 mmol/L (ref 135–145)

## 2023-08-09 LAB — CBC
HCT: 38 % (ref 36.0–46.0)
Hemoglobin: 12.3 g/dL (ref 12.0–15.0)
MCH: 26.9 pg (ref 26.0–34.0)
MCHC: 32.4 g/dL (ref 30.0–36.0)
MCV: 83.2 fL (ref 80.0–100.0)
Platelets: 298 10*3/uL (ref 150–400)
RBC: 4.57 MIL/uL (ref 3.87–5.11)
RDW: 14.5 % (ref 11.5–15.5)
WBC: 22.4 10*3/uL — ABNORMAL HIGH (ref 4.0–10.5)
nRBC: 0 % (ref 0.0–0.2)

## 2023-08-09 LAB — SURGICAL PATHOLOGY

## 2023-08-09 LAB — MAGNESIUM: Magnesium: 1.7 mg/dL (ref 1.7–2.4)

## 2023-08-09 NOTE — Plan of Care (Signed)

## 2023-08-09 NOTE — TOC CM/SW Note (Addendum)
 Transition of Care [facility]) - Inpatient Brief Assessment   Patient Details  Name: Michele Sanders MRN: [id number] Date of Birth: 1955/07/31  Transition of Care [facility]) CM/SW Contact:    Loman Risk, RN Phone Number: 08/09/2023, 10:14 AM   Clinical Narrative:   Transition of Care [facility]) Screening Note   Patient Details  Name: Michele Sanders Date of Birth: [birth year]   Transition of Care [facility]) CM/SW Contact:    Loman Risk, RN Phone Number: 08/09/2023, 10:14 AM    Transition of Care Department [facility]) has reviewed patient and no TOC needs have been identified at this time. If new patient transition needs arise, please place a TOC consult.  Message sent to [facility] with [employer] to determine if patient is still active  Update:  Randel Buss with [employer] confirms that patient is not active    Transition of Care Asessment: Insurance and Status: Insurance coverage has been reviewed Patient has primary care physician: Yes       Social Drivers of Health Review: SDOH reviewed no interventions necessary Readmission risk has been reviewed: Yes Transition of care needs: no transition of care needs at this time

## 2023-08-09 NOTE — Progress Notes (Signed)
 [place] [facility] SURGICAL PROGRESS NOTE  Hospital Day(s): 1.   Post op day(s): 1 Day Post-Op.   Interval History:  Patient seen and examined No acute events or new complaints overnight.  Patient reports she is doing well; pain 2/10 with movement No fever, chills, nausea, emesis  Leukocytosis to 22.4K; likely reactive Hgb to 12.3 Renal function normal; sCr - 0.87; UO - 1075 ccs No electrolyte derangements She is on CLD No flatus   Vital signs in last 24 hours: [min-max] current  Temp:  [97 F (36.1 C)-98 F (36.7 C)] 98 F (36.7 C) (06/04 0421) Pulse Rate:  [59-90] 75 (06/04 0421) Resp:  [9-20] 20 (06/04 0421) BP: (93-149)/(51-77) 137/63 (06/04 0421) SpO2:  [94 %-100 %] 94 % (06/04 0421)             Intake/Output last 2 shifts:  06/03 0701 - 06/04 0700 In: 3167.1 [P.O.:1500; I.V.:1567.1; IV Piggyback:100] Out: 1095 [Urine:1075; Blood:20]   Physical Exam:  Constitutional: alert, cooperative and no distress  Respiratory: breathing non-labored at rest  Cardiovascular: regular rate and sinus rhythm  Gastrointestinal: soft, incisional soreness, and non-distended. No rebound/guarding Genitourinary: Vanyo in place; good UO Integumentary: Laparoscopic incisions are CDI with staples and honeycomb, no drainage   Labs:     Latest Ref Rng & Units 08/09/2023    2:40 AM 01/23/2023    3:08 AM 01/22/2023    5:33 AM  CBC  WBC 4.0 - 10.5 K/uL 22.4  7.2  10.3   Hemoglobin 12.0 - 15.0 g/dL 16.1  09.6  04.5   Hematocrit 36.0 - 46.0 % 38.0  36.8  35.3   Platelets 150 - 400 K/uL 298  157  274       Latest Ref Rng & Units 08/09/2023    2:40 AM 05/31/2023   12:04 PM 01/23/2023    3:08 AM  CMP  Glucose 70 - 99 mg/dL 409   811   BUN 8 - 23 mg/dL 11   13   Creatinine 9.14 - 1.00 mg/dL 7.82  9.56  2.13   Sodium 135 - 145 mmol/L 138   140   Potassium 3.5 - 5.1 mmol/L 3.7   3.6   Chloride 98 - 111 mmol/L 107   105   CO2 22 - 32 mmol/L 25   25   Calcium 8.9 - 10.3 mg/dL 8.3    7.8      Imaging studies: No new pertinent imaging studies   Assessment/Plan:  68 y.o. female 1 Day Post-Op s/p colostomy takedown   - CLD this AM; we can do FLD for lunch  - Continue Entereg today; Okay to DC once bowel function returns  - Complete perioperative Abx  - Discontinue Secord catheter  - Monitor abdominal examination; on-going bowel function   - Monitor leukocytosis; likely reactive - morning labs   - Pain control prn; antiemetics prn  - Mobilize as tolerated   All of the above findings and recommendations were discussed with the patient, and the medical team, and all of patient's questions were answered to her expressed satisfaction.  -- Apolonio Bay, PA-C [facility] 08/09/2023, 7:45 AM M-F: 7am - 4pm

## 2023-08-10 LAB — CBC
HCT: 37 % (ref 36.0–46.0)
Hemoglobin: 11.9 g/dL — ABNORMAL LOW (ref 12.0–15.0)
MCH: 26.7 pg (ref 26.0–34.0)
MCHC: 32.2 g/dL (ref 30.0–36.0)
MCV: 83 fL (ref 80.0–100.0)
Platelets: 295 10*3/uL (ref 150–400)
RBC: 4.46 MIL/uL (ref 3.87–5.11)
RDW: 14.9 % (ref 11.5–15.5)
WBC: 14.6 10*3/uL — ABNORMAL HIGH (ref 4.0–10.5)
nRBC: 0 % (ref 0.0–0.2)

## 2023-08-10 LAB — BASIC METABOLIC PANEL WITH GFR
Anion gap: 7 (ref 5–15)
BUN: 8 mg/dL (ref 8–23)
CO2: 26 mmol/L (ref 22–32)
Calcium: 8.2 mg/dL — ABNORMAL LOW (ref 8.9–10.3)
Chloride: 107 mmol/L (ref 98–111)
Creatinine, Ser: 0.72 mg/dL (ref 0.44–1.00)
GFR, Estimated: >60 mL/min (ref >60)
Glucose, Bld: 92 mg/dL (ref 70–99)
Potassium: 3.3 mmol/L — ABNORMAL LOW (ref 3.5–5.1)
Sodium: 140 mmol/L (ref 135–145)

## 2023-08-10 NOTE — Plan of Care (Signed)

## 2023-08-10 NOTE — TOC Transition Note (Signed)
 Transition of Care [facility]) - Discharge Note   Patient Details  Name: Michele Sanders MRN: [id number] Date of Birth: 12-Oct-1955  Transition of Care [facility]) CM/SW Contact:  Loman Risk, RN Phone Number: 08/10/2023, 9:09 AM   Clinical Narrative:     Provided information at discharge for meals on wheel and [employer]        Patient Goals and CMS Choice            Discharge Placement                       Discharge Plan and Services Additional resources added to the After Visit Summary for                                       Social Drivers of Health (SDOH) Interventions SDOH Screenings   Food Insecurity: No Food Insecurity (08/09/2023)  Housing: Low Risk  (08/09/2023)  Transportation Needs: No Transportation Needs (08/09/2023)  Utilities: Not At Risk (08/09/2023)  Financial Resource Strain: Low Risk  (06/14/2023)   Received from [facility]  Social Connections: Unknown (08/09/2023)  Tobacco Use: High Risk (08/08/2023)     Readmission Risk Interventions     No data to display

## 2023-08-10 NOTE — Discharge Instructions (Addendum)
 In addition to included general post-operative instructions,  Diet: Resume home diet.   Activity: No heavy lifting >20 pounds (children, pets, laundry, garbage) or strenuous activity for 6 weeks, but light activity and walking are encouraged. Do not drive or drink alcohol if taking narcotic pain medications or having pain that might distract from driving.  Wound care: You may shower/get incision wet with soapy water  and pat dry (do not rub incisions), but no baths or submerging incision underwater until follow-up. We will remove staples at follow up on 06/18  Medications: Resume all home medications. For mild to moderate pain: acetaminophen  (Tylenol ) or ibuprofen /naproxen (if no kidney disease). Combining Tylenol  with alcohol can substantially increase your risk of causing liver disease. Narcotic pain medications, if prescribed, can be used for severe pain, though may cause nausea, constipation, and drowsiness. Do not combine Tylenol  and Percocet (or similar) within a 6 hour period as Percocet (and similar) contain(s) Tylenol . If you do not need the narcotic pain medication, you do not need to fill the prescription.  Call office [phone] / [phone]) at any time if any questions, worsening pain, fevers/chills, bleeding, drainage from incision site, or other concerns.   Food Resources  Agency Name: [facility] Agency Address: [address] [place], [place] [address] Phone: [phone] Website: www.alamanceservices.org Service(s) Offered: Housing services, self-sufficiency, congregate meal program, weatherization program, Event organiser program, emergency food assistance,  housing counseling, home ownership program, wheels - to work program.  [employer] free for 60 and older at various locations from [employer], Monday-Friday:  [employer], [address] [place], [phone] -[facility], [address], [place] [phone]   -[facility], [address] [place] [phone]  -[address], [place], [phone]  Agency Name: [facility] Address: [phone] [address], [place], [place] [address] Phone: [phone] Website: www.alamancemow.org Service(s) Offered: Home delivered hot, frozen, and emergency  meals. Grocery assistance program which matches  volunteers one-on-one with seniors unable to grocery shop  for themselves. Must be 60 years and older; less than 20  hours of in-home aide service, limited or no driving ability;  live alone or with someone with a disability; live in  [employer].  Agency Name: [employer] [facility] [employer]) Address: [address] [place], [place] [address] Phone: [phone] Service(s) Offered: Food is served to shut-ins, homeless, elderly, and low income people in the community every Saturday (11:30 am-12:30 pm) and Sunday (12:30 pm-1:30pm). Volunteers also offer help and encouragement in seeking employment,  and spiritual guidance.  Agency Name: [employer] Address: [address] [place], [place] [address] Phone: [phone] Service(s) Offered: Child support services; child welfare services; food stamps; Medicaid; work first family assistance; and aid with fuel,  rent, food and medicine.  Agency Name: [employer]itian Address: [address] [place], [place] Phone: [phone] Website: www.dreamalign.com Services Offered: Monday 10:00am-12:00, 8:00pm-9:00pm, and Friday 10:00am-12:00.  Agency Name: [employer] Address: [address], [place], [place] [address] Phone: [phone] Website: www.[email or web address] Service(s) Offered: Serves weekday meals, open from 11:30 am- 1:00 pm., and 6:30-7:30pm, Monday-Wednesday-Friday distributes food 3:30-6pm, Monday-Wednesday-Friday.  Agency Name: [facility] Address: [address], [place], [place] Phone: [phone] Website: [email or web address] Services Offered: Distributes food the 4th Saturday of the month, starting at 8:00 am  Agency Name: [facility] Address: [phone] [address], [place], [place] [address] Phone: [phone] Website: [email or web address] Service(s) Offered: [employer] life,  weekly food pantry. Open Wednesdays from 10:00am-noon.  Agency Name: [facility] [employer] Address: [address] [place], [place], [place] Phone: [phone] Services Offered: Distributes food 9am-1pm, Monday-Thursday. Call for details.  Agency Name: [facility] Address: [address] [place], [place] [address] Phone: [phone] Website: [email or web address] Service(s) Offered: Games developer. Call for assistance.  Agency Name: [facility] Address: [address] [place], [place] [address] Phone: [phone] Service Offered: Emergency Food Pantry. Call for appointment.  Agency Name: [facility] Address: [address], [place], [place] [address] Phone: [phone] Website: [email or web address] Services Offered: Games developer. Call for details  Agency Name: [facility] Address: [address] [place], [place] Phone: [phone] Website: [email or web address] Service(s) Offered: Emergency Food Pantry. Call for details.  Agency Name: Holiday representative Address: [address] [place], [place] [address] Phone: [phone] or [phone] Website: [email or web address] Service(s) Offered: Distribute food 9am-11:30 am, Tuesday-Friday, and 1-3:30pm, Monday-Friday. Food pantry Monday-Friday 1pm-3pm, fresh items, Mon.-Wed.-Fri.  Agency Name: [facility] Address: [address] [place], [place] [address] Phone: [phone] Website: www.safealamance.org Services Offered: Distribute  food Tues and Sats from 9:00am-noon. Closed 1st Saturday of each month. Call for details  Agency Name: [employer] Address: [place] [facility] [address], [place] [address] Phone: [phone]  Services Offered: Delivers meals every Thursday

## 2023-08-10 NOTE — Progress Notes (Signed)
 Patient asking to speak to case manager or social worker this morning about getting some assistance at home with meals.

## 2023-08-10 NOTE — Discharge Summary (Signed)
 [facility] SURGICAL DISCHARGE SUMMARY  Patient ID: Michele Sanders MRN: [id number] DOB/AGE: 11-23-1955 68 y.o.  Admit date: 08/08/2023 Discharge date: 08/10/2023  Discharge Diagnoses Patient Active Problem List   Diagnosis Date Noted   Parastomal hernia without obstruction or gangrene 08/08/2023   S/P colostomy takedown 08/08/2023    Consultants None  Procedures 08/08/2023:  1. Laparoscopic lysis of adhesions 2. Laparoscopic takedown of hartmann's EEA stapled anastomosis 3. Repair parastomal hernia  HPI: Michele Sanders is a 68 y.o. female with history of Hartman's procedure who presents to [facility] for reversal.   Hospital Course: Informed consent was obtained and documented, and patient underwent uneventful laparoscopic colostomy takedown (Dr Dana Duncan, 08/08/2023).  Post-operatively, patient did well with ROBF on POD1. Advancement of patient's diet and ambulation were well-tolerated. The remainder of patient's hospital course was essentially unremarkable, and discharge planning was initiated accordingly with patient safely able to be discharged home with appropriate discharge instructions, pain control, and outpatient follow-up after all of her questions were answered to her expressed satisfaction.  I did offer analgesic prescription for home however patient preferred to manage pain with OTC modalities. She has only need tylenol  and Toradol  here. She understands to call the office if she changes her mind.    Discharge Condition: Good   Physical Examination:  Constitutional: alert, cooperative and no distress  Respiratory: breathing non-labored at rest  Cardiovascular: regular rate and sinus rhythm  Gastrointestinal: soft, incisional soreness, and non-distended. No rebound/guarding Integumentary: Laparoscopic incisions are CDI with staples, no drainage    Allergies as of 08/10/2023   No Known Allergies      Medication List     STOP taking these medications     bisacodyl  5 MG EC tablet Commonly known as: DULCOLAX   metroNIDAZOLE  500 MG tablet Commonly known as: FLAGYL    neomycin  500 MG tablet Commonly known as: MYCIFRADIN    polyethylene glycol powder 17 GM/SCOOP powder Commonly known as: MiraLax        TAKE these medications    aspirin  81 MG chewable tablet Chew 81 mg by mouth daily.   cholecalciferol 25 MCG (1000 UNIT) tablet Commonly known as: VITAMIN D3 Take 1,000 Units by mouth daily.   cyanocobalamin 1000 MCG tablet Commonly known as: VITAMIN B12 Take 1,000 mcg by mouth daily.   lisinopril  20 MG tablet Commonly known as: ZESTRIL  Take 20 mg by mouth daily.   pantoprazole  40 MG tablet Commonly known as: PROTONIX  Take 40 mg by mouth daily.   simvastatin  40 MG tablet Commonly known as: ZOCOR  Take 40 mg by mouth daily at 6 PM.          Follow-up Information     Cadience Bradfield R, PA-C. Go on 08/23/2023.   Specialty: Physician Assistant Why: Go to appointment on 06/18 at 245 PM Contact information: [address] [place] [address] [phone]                  Time spent on discharge management including discussion of hospital course, clinical condition, outpatient instructions, prescriptions, and follow up with the patient and members of the medical team: >30 minutes  -- Apolonio Bay , PA-C [facility]  08/10/2023, 8:20 AM [phone] M-F: 7am - 4pm

## 2023-08-10 NOTE — Progress Notes (Signed)
 Patient discharged home with family via personal car. Patient in no pain or distress at time of discharge. Discharge instructions gone over with patient and all questions answered.

## 2023-08-10 NOTE — Plan of Care (Signed)

## 2023-08-10 NOTE — Anesthesia Postprocedure Evaluation (Signed)
 Anesthesia Post Note  Patient: Michele Sanders  Procedure(s) Performed: CLOSURE, COLOSTOMY, LAPAROSCOPIC  Patient location during evaluation: PACU Anesthesia Type: General Level of consciousness: awake and alert Pain management: pain level controlled Vital Signs Assessment: post-procedure vital signs reviewed and stable Respiratory status: spontaneous breathing, nonlabored ventilation and respiratory function stable Cardiovascular status: blood pressure returned to baseline and stable Postop Assessment: no apparent nausea or vomiting Anesthetic complications: no   No notable events documented.   Last Vitals:  Vitals:   08/09/23 2024 08/10/23 0503  BP: (!) 171/77 (!) 179/82  Pulse: 74 75  Resp: 19 18  Temp: 36.9 C 36.8 C  SpO2: 90% 93%    Last Pain:  Vitals:   08/10/23 0732  TempSrc:   PainSc: 0-No pain                 Baltazar Bonier

## 2023-08-23 ENCOUNTER — Encounter: Payer: Self-pay | Admitting: Physician Assistant

## 2023-08-23 ENCOUNTER — Ambulatory Visit (INDEPENDENT_AMBULATORY_CARE_PROVIDER_SITE_OTHER): Admitting: Physician Assistant

## 2023-08-23 VITALS — BP 147/78 | HR 83 | Ht 64.0 in | Wt 180.0 lb

## 2023-08-23 DIAGNOSIS — K435 Parastomal hernia without obstruction or  gangrene: Secondary | ICD-10-CM

## 2023-08-23 DIAGNOSIS — Z09 Encounter for follow-up examination after completed treatment for conditions other than malignant neoplasm: Secondary | ICD-10-CM

## 2023-08-23 DIAGNOSIS — Z433 Encounter for attention to colostomy: Secondary | ICD-10-CM

## 2023-08-23 DIAGNOSIS — Z933 Colostomy status: Secondary | ICD-10-CM

## 2023-08-23 NOTE — Patient Instructions (Addendum)
 Follow up here in 1 month.  Please call and ask to speak with a nurse if you develop questions or concerns.   GENERAL POST-OPERATIVE PATIENT INSTRUCTIONS   WOUND CARE INSTRUCTIONS:  Keep a dry clean dressing on the wound if there is drainage. The initial bandage may be removed after 24 hours.  Once the wound has quit draining you may leave it open to air.  If clothing rubs against the wound or causes irritation and the wound is not draining you may cover it with a dry dressing during the daytime.  Try to keep the wound dry and avoid ointments on the wound unless directed to do so.  If the wound becomes bright red and painful or starts to drain infected material that is not clear, please contact your physician immediately.  If the wound is mildly pink and has a thick firm ridge underneath it, this is normal, and is referred to as a healing ridge.  This will resolve over the next 4-6 weeks.  BATHING: You may shower if you have been informed of this by your surgeon. However, Please do not submerge in a tub, hot tub, or pool until incisions are completely sealed or have been told by your surgeon that you may do so.  DIET:  You may eat any foods that you can tolerate.  It is a good idea to eat a high fiber diet and take in plenty of fluids to prevent constipation.  If you do become constipated you may want to take a mild laxative or take ducolax tablets on a daily basis until your bowel habits are regular.  Constipation can be very uncomfortable, along with straining, after recent surgery.  ACTIVITY:  You are encouraged to cough and deep breath or use your incentive spirometer if you were given one, every 15-30 minutes when awake.  This will help prevent respiratory complications and low grade fevers post-operatively if you had a general anesthetic.  You may want to hug a pillow when coughing and sneezing to add additional support to the surgical area, if you had abdominal or chest surgery, which will  decrease pain during these times.  You are encouraged to walk and engage in light activity for the next two weeks.  You should not lift more than 20 pounds for 6 weeks total after surgery as it could put you at increased risk for complications.  Twenty pounds is roughly equivalent to a plastic bag of groceries. At that time- Listen to your body when lifting, if you have pain when lifting, stop and then try again in a few days. Soreness after doing exercises or activities of daily living is normal as you get back in to your normal routine.  MEDICATIONS:  Try to take narcotic medications and anti-inflammatory medications, such as tylenol , ibuprofen , naprosyn, etc., with food.  This will minimize stomach upset from the medication.  Should you develop nausea and vomiting from the pain medication, or develop a rash, please discontinue the medication and contact your physician.  You should not drive, make important decisions, or operate machinery when taking narcotic pain medication.  SUNBLOCK Use sun block to incision area over the next year if this area will be exposed to sun. This helps decrease scarring and will allow you avoid a permanent darkened area over your incision.  QUESTIONS:  Please feel free to call our office if you have any questions, and we will be glad to assist you. [phone]

## 2023-08-23 NOTE — Progress Notes (Signed)
 Colostomy reversal - [place] 06/03 Doing well Only needing tylenol  No nausea/emesis Bowel function normalized Staples out; wounds without erythema, drainage  Reviewed restrictions, wound care  RTC in 1 month   -- Arthea Platt, PA-C [facility] 08/25/2023, 10:50 AM M-F: 7am - 4pm

## 2023-09-06 ENCOUNTER — Encounter: Payer: Self-pay | Admitting: Physician Assistant

## 2023-09-06 ENCOUNTER — Ambulatory Visit: Admitting: Physician Assistant

## 2023-09-06 VITALS — BP 145/76 | HR 85 | Temp 98.2°F | Ht 64.0 in | Wt 180.8 lb

## 2023-09-06 DIAGNOSIS — K9400 Colostomy complication, unspecified: Secondary | ICD-10-CM

## 2023-09-06 DIAGNOSIS — K435 Parastomal hernia without obstruction or  gangrene: Secondary | ICD-10-CM

## 2023-09-06 DIAGNOSIS — Z09 Encounter for follow-up examination after completed treatment for conditions other than malignant neoplasm: Secondary | ICD-10-CM

## 2023-09-06 DIAGNOSIS — Z433 Encounter for attention to colostomy: Secondary | ICD-10-CM

## 2023-09-06 NOTE — Patient Instructions (Signed)

## 2023-09-06 NOTE — Progress Notes (Signed)
 [facility] POST-OP OFFICE VISIT  09/06/2023  HPI: Michele Sanders is a 68 y.o. female 1 month s/p colostomy reversal with Dr Jordis   She has been doing very well In the last 24-48 hours, she reports increased soreness and burning sensation at the lateral aspect of her previous colostomy site No fever, chills, nausea, emesis Still tolerating PO; no issues with BM Otherwise doing well   Vital signs: BP (!) 145/76   Pulse 85   Temp 98.2 F (36.8 C) (Oral)   Ht 5' 4 (1.626 m)   Wt 180 lb 12.8 oz (82 kg)   SpO2 96%   BMI 31.03 kg/m    Physical Exam: Constitutional: Well appearing female, NAD Abdomen: Soft, she is sore to the lateral aspect of her previous colostomy site, non-distended, no rebound/guarding  Skin: Previous ostomy site is well healed, no erythema. All other incisions are well healed.   Assessment/Plan: This is a 68 y.o. female 1 month s/p colostomy reversal with Dr Jordis    - I suspected discomfort she is feeling is secondary to closure of stoma site and underlying swelling/inflammation associated with healing. There is no evidence of hernia, abscess, hematoma. I did my best to provide reassurance.   - She can continue pain control prn  - Reviewed lifting restrictions; 6 weeks total  - She will follow up otherwise as scheduled; She understands to call with questions/concerns  -- Arthea Platt, PA-C [facility] 09/06/2023, 2:56 PM M-F: 7am - 4pm

## 2023-09-20 ENCOUNTER — Encounter: Admitting: Surgery

## 2024-01-18 ENCOUNTER — Other Ambulatory Visit: Payer: Self-pay

## 2024-01-18 ENCOUNTER — Telehealth: Payer: Self-pay | Admitting: Surgery

## 2024-01-18 DIAGNOSIS — Z09 Encounter for follow-up examination after completed treatment for conditions other than malignant neoplasm: Secondary | ICD-10-CM

## 2024-01-18 DIAGNOSIS — K5732 Diverticulitis of large intestine without perforation or abscess without bleeding: Secondary | ICD-10-CM

## 2024-01-18 NOTE — Telephone Encounter (Signed)
 Patient had colostomy takedown 08/08/23.  She got a notice from [facility] about scheduling her colonoscopy and is concerned about getting one done at this time. She wants to know if she should wait a certain length of time before scheduling a colonoscopy as didn't want anything to damage the previous surgery she had.  Please advise.  Thank you.

## 2024-01-18 NOTE — Telephone Encounter (Signed)
 Patient is called back, per Dr. Jordis as long as patient is more than 2 months out from takedown, then scheduling for colonoscopy will be fine.
